# Patient Record
Sex: Female | Born: 1945 | Race: White | Hispanic: No | Smoking: Former smoker
Health system: Southern US, Community
[De-identification: ages and names within clinical notes are randomized; demographics above are authoritative.]

## PROBLEM LIST (undated history)

## (undated) DIAGNOSIS — J329 Chronic sinusitis, unspecified: Secondary | ICD-10-CM

## (undated) DIAGNOSIS — T63441A Toxic effect of venom of bees, accidental (unintentional), initial encounter: Secondary | ICD-10-CM

## (undated) DIAGNOSIS — M545 Low back pain, unspecified: Secondary | ICD-10-CM

## (undated) DIAGNOSIS — M899 Disorder of bone, unspecified: Secondary | ICD-10-CM

## (undated) DIAGNOSIS — M8580 Other specified disorders of bone density and structure, unspecified site: Secondary | ICD-10-CM

## (undated) DIAGNOSIS — F329 Major depressive disorder, single episode, unspecified: Secondary | ICD-10-CM

## (undated) DIAGNOSIS — E559 Vitamin D deficiency, unspecified: Secondary | ICD-10-CM

## (undated) DIAGNOSIS — M1990 Unspecified osteoarthritis, unspecified site: Secondary | ICD-10-CM

## (undated) DIAGNOSIS — N390 Urinary tract infection, site not specified: Secondary | ICD-10-CM

## (undated) DIAGNOSIS — Z780 Asymptomatic menopausal state: Secondary | ICD-10-CM

## (undated) DIAGNOSIS — D692 Other nonthrombocytopenic purpura: Secondary | ICD-10-CM

## (undated) DIAGNOSIS — F411 Generalized anxiety disorder: Secondary | ICD-10-CM

## (undated) DIAGNOSIS — B029 Zoster without complications: Secondary | ICD-10-CM

## (undated) DIAGNOSIS — E7800 Pure hypercholesterolemia, unspecified: Secondary | ICD-10-CM

## (undated) DIAGNOSIS — M810 Age-related osteoporosis without current pathological fracture: Secondary | ICD-10-CM

## (undated) DIAGNOSIS — E785 Hyperlipidemia, unspecified: Secondary | ICD-10-CM

## (undated) DIAGNOSIS — J309 Allergic rhinitis, unspecified: Secondary | ICD-10-CM

## (undated) HISTORY — DX: Pure hypercholesterolemia, unspecified: E78.00

## (undated) HISTORY — DX: Major depressive disorder, single episode, unspecified: F32.9

## (undated) HISTORY — DX: Low back pain: M54.5

## (undated) HISTORY — DX: Toxic effect of venom of bees, accidental (unintentional), initial encounter: T63.441A

## (undated) HISTORY — DX: Vitamin D deficiency, unspecified: E55.9

## (undated) HISTORY — DX: Low back pain, unspecified: M54.50

## (undated) HISTORY — DX: Zoster without complications: B02.9

## (undated) HISTORY — DX: Disorder of bone, unspecified: M89.9

## (undated) HISTORY — PX: NO PAST SURGERIES: SHX2092

## (undated) HISTORY — DX: Hyperlipidemia, unspecified: E78.5

## (undated) HISTORY — DX: Other specified disorders of bone density and structure, unspecified site: M85.80

## (undated) HISTORY — DX: Chronic sinusitis, unspecified: J32.9

## (undated) HISTORY — DX: Urinary tract infection, site not specified: N39.0

## (undated) HISTORY — DX: Generalized anxiety disorder: F41.1

## (undated) HISTORY — DX: Asymptomatic menopausal state: Z78.0

## (undated) HISTORY — DX: Age-related osteoporosis without current pathological fracture: M81.0

## (undated) HISTORY — DX: Unspecified osteoarthritis, unspecified site: M19.90

## (undated) HISTORY — DX: Allergic rhinitis, unspecified: J30.9

## (undated) HISTORY — DX: Other nonthrombocytopenic purpura: D69.2

---

## 1998-02-07 ENCOUNTER — Other Ambulatory Visit: Admission: RE | Admit: 1998-02-07 | Discharge: 1998-02-07 | Payer: Self-pay | Admitting: *Deleted

## 2000-05-19 ENCOUNTER — Other Ambulatory Visit: Admission: RE | Admit: 2000-05-19 | Discharge: 2000-05-19 | Payer: Self-pay | Admitting: *Deleted

## 2010-03-18 LAB — BASIC METABOLIC PANEL
BUN: 12 (ref 4–21)
CO2: 30 — AB (ref 13–22)
Chloride: 104 (ref 99–108)
Creatinine: 0.7 (ref 0.5–1.1)
Glucose: 105
Potassium: 4.4 (ref 3.4–5.3)
Sodium: 138 (ref 137–147)

## 2010-03-18 LAB — LIPID PANEL
Cholesterol: 197 (ref 0–200)
HDL: 72 — AB (ref 35–70)
Triglycerides: 56 (ref 40–160)

## 2010-03-18 LAB — TSH: TSH: 1.03 (ref 0.41–5.90)

## 2010-03-18 LAB — HEPATIC FUNCTION PANEL
ALT: 14 (ref 7–35)
AST: 18 (ref 13–35)
Alkaline Phosphatase: 43 (ref 25–125)
Bilirubin, Total: 0.7

## 2010-03-18 LAB — COMPREHENSIVE METABOLIC PANEL
Albumin: 4.1 (ref 3.5–5.0)
Calcium: 9.1 (ref 8.7–10.7)

## 2010-03-18 LAB — CBC AND DIFFERENTIAL
HCT: 37 (ref 36–46)
Hemoglobin: 12.9 (ref 12.0–16.0)
Platelets: 304 (ref 150–399)
WBC: 6.5

## 2010-03-18 LAB — CBC: RBC: 4 (ref 3.87–5.11)

## 2010-09-16 LAB — LIPID PANEL
Cholesterol: 184 (ref 0–200)
HDL: 123 — AB (ref 35–70)
LDL Cholesterol: 117
Triglycerides: 59 (ref 40–160)

## 2010-09-16 LAB — HEPATIC FUNCTION PANEL
ALT: 16 (ref 7–35)
AST: 21 (ref 13–35)
Alkaline Phosphatase: 42 (ref 25–125)

## 2011-04-16 DIAGNOSIS — L819 Disorder of pigmentation, unspecified: Secondary | ICD-10-CM | POA: Diagnosis not present

## 2011-04-16 DIAGNOSIS — L570 Actinic keratosis: Secondary | ICD-10-CM | POA: Diagnosis not present

## 2011-04-16 DIAGNOSIS — L578 Other skin changes due to chronic exposure to nonionizing radiation: Secondary | ICD-10-CM | POA: Diagnosis not present

## 2011-04-16 DIAGNOSIS — L821 Other seborrheic keratosis: Secondary | ICD-10-CM | POA: Diagnosis not present

## 2011-09-25 DIAGNOSIS — E559 Vitamin D deficiency, unspecified: Secondary | ICD-10-CM | POA: Diagnosis not present

## 2011-09-25 DIAGNOSIS — E785 Hyperlipidemia, unspecified: Secondary | ICD-10-CM | POA: Diagnosis not present

## 2011-09-30 DIAGNOSIS — M949 Disorder of cartilage, unspecified: Secondary | ICD-10-CM | POA: Diagnosis not present

## 2011-09-30 DIAGNOSIS — E559 Vitamin D deficiency, unspecified: Secondary | ICD-10-CM | POA: Diagnosis not present

## 2011-09-30 DIAGNOSIS — E785 Hyperlipidemia, unspecified: Secondary | ICD-10-CM | POA: Diagnosis not present

## 2011-09-30 DIAGNOSIS — F411 Generalized anxiety disorder: Secondary | ICD-10-CM | POA: Diagnosis not present

## 2011-09-30 DIAGNOSIS — M899 Disorder of bone, unspecified: Secondary | ICD-10-CM | POA: Diagnosis not present

## 2011-10-03 DIAGNOSIS — D239 Other benign neoplasm of skin, unspecified: Secondary | ICD-10-CM | POA: Diagnosis not present

## 2011-10-03 DIAGNOSIS — L259 Unspecified contact dermatitis, unspecified cause: Secondary | ICD-10-CM | POA: Diagnosis not present

## 2011-10-03 DIAGNOSIS — L821 Other seborrheic keratosis: Secondary | ICD-10-CM | POA: Diagnosis not present

## 2011-10-03 DIAGNOSIS — Z85828 Personal history of other malignant neoplasm of skin: Secondary | ICD-10-CM | POA: Diagnosis not present

## 2012-01-26 DIAGNOSIS — Z1231 Encounter for screening mammogram for malignant neoplasm of breast: Secondary | ICD-10-CM | POA: Diagnosis not present

## 2012-03-31 DIAGNOSIS — Z85828 Personal history of other malignant neoplasm of skin: Secondary | ICD-10-CM | POA: Diagnosis not present

## 2012-03-31 DIAGNOSIS — C44319 Basal cell carcinoma of skin of other parts of face: Secondary | ICD-10-CM | POA: Diagnosis not present

## 2012-03-31 DIAGNOSIS — L819 Disorder of pigmentation, unspecified: Secondary | ICD-10-CM | POA: Diagnosis not present

## 2012-03-31 DIAGNOSIS — L570 Actinic keratosis: Secondary | ICD-10-CM | POA: Diagnosis not present

## 2012-05-03 DIAGNOSIS — E785 Hyperlipidemia, unspecified: Secondary | ICD-10-CM | POA: Diagnosis not present

## 2012-08-23 DIAGNOSIS — H04129 Dry eye syndrome of unspecified lacrimal gland: Secondary | ICD-10-CM | POA: Diagnosis not present

## 2012-08-23 DIAGNOSIS — H5710 Ocular pain, unspecified eye: Secondary | ICD-10-CM | POA: Diagnosis not present

## 2012-09-10 DIAGNOSIS — H524 Presbyopia: Secondary | ICD-10-CM | POA: Diagnosis not present

## 2012-09-10 DIAGNOSIS — H04129 Dry eye syndrome of unspecified lacrimal gland: Secondary | ICD-10-CM | POA: Diagnosis not present

## 2012-09-10 DIAGNOSIS — H2640 Unspecified secondary cataract: Secondary | ICD-10-CM | POA: Diagnosis not present

## 2012-09-10 DIAGNOSIS — Z961 Presence of intraocular lens: Secondary | ICD-10-CM | POA: Diagnosis not present

## 2012-09-29 DIAGNOSIS — L570 Actinic keratosis: Secondary | ICD-10-CM | POA: Diagnosis not present

## 2012-09-29 DIAGNOSIS — L819 Disorder of pigmentation, unspecified: Secondary | ICD-10-CM | POA: Diagnosis not present

## 2012-09-29 DIAGNOSIS — D239 Other benign neoplasm of skin, unspecified: Secondary | ICD-10-CM | POA: Diagnosis not present

## 2012-09-29 DIAGNOSIS — D485 Neoplasm of uncertain behavior of skin: Secondary | ICD-10-CM | POA: Diagnosis not present

## 2012-09-29 DIAGNOSIS — Z85828 Personal history of other malignant neoplasm of skin: Secondary | ICD-10-CM | POA: Diagnosis not present

## 2012-09-29 DIAGNOSIS — L988 Other specified disorders of the skin and subcutaneous tissue: Secondary | ICD-10-CM | POA: Diagnosis not present

## 2012-09-29 DIAGNOSIS — L821 Other seborrheic keratosis: Secondary | ICD-10-CM | POA: Diagnosis not present

## 2012-09-29 DIAGNOSIS — I789 Disease of capillaries, unspecified: Secondary | ICD-10-CM | POA: Diagnosis not present

## 2012-10-28 DIAGNOSIS — M899 Disorder of bone, unspecified: Secondary | ICD-10-CM | POA: Diagnosis not present

## 2012-10-28 DIAGNOSIS — E7800 Pure hypercholesterolemia, unspecified: Secondary | ICD-10-CM | POA: Diagnosis not present

## 2012-10-28 DIAGNOSIS — Z1211 Encounter for screening for malignant neoplasm of colon: Secondary | ICD-10-CM | POA: Diagnosis not present

## 2012-10-28 DIAGNOSIS — F411 Generalized anxiety disorder: Secondary | ICD-10-CM | POA: Diagnosis not present

## 2012-11-04 DIAGNOSIS — M899 Disorder of bone, unspecified: Secondary | ICD-10-CM | POA: Diagnosis not present

## 2012-11-04 DIAGNOSIS — E7800 Pure hypercholesterolemia, unspecified: Secondary | ICD-10-CM | POA: Diagnosis not present

## 2013-02-02 DIAGNOSIS — Z1231 Encounter for screening mammogram for malignant neoplasm of breast: Secondary | ICD-10-CM | POA: Diagnosis not present

## 2013-04-08 DIAGNOSIS — L570 Actinic keratosis: Secondary | ICD-10-CM | POA: Diagnosis not present

## 2013-04-08 DIAGNOSIS — L819 Disorder of pigmentation, unspecified: Secondary | ICD-10-CM | POA: Diagnosis not present

## 2013-04-08 DIAGNOSIS — Z85828 Personal history of other malignant neoplasm of skin: Secondary | ICD-10-CM | POA: Diagnosis not present

## 2013-04-08 DIAGNOSIS — L723 Sebaceous cyst: Secondary | ICD-10-CM | POA: Diagnosis not present

## 2013-04-28 DIAGNOSIS — E785 Hyperlipidemia, unspecified: Secondary | ICD-10-CM | POA: Diagnosis not present

## 2013-04-28 DIAGNOSIS — F411 Generalized anxiety disorder: Secondary | ICD-10-CM | POA: Diagnosis not present

## 2013-09-15 DIAGNOSIS — H2640 Unspecified secondary cataract: Secondary | ICD-10-CM | POA: Diagnosis not present

## 2013-09-15 DIAGNOSIS — Z961 Presence of intraocular lens: Secondary | ICD-10-CM | POA: Diagnosis not present

## 2013-09-15 DIAGNOSIS — H52209 Unspecified astigmatism, unspecified eye: Secondary | ICD-10-CM | POA: Diagnosis not present

## 2013-10-11 DIAGNOSIS — L570 Actinic keratosis: Secondary | ICD-10-CM | POA: Diagnosis not present

## 2013-10-11 DIAGNOSIS — Z85828 Personal history of other malignant neoplasm of skin: Secondary | ICD-10-CM | POA: Diagnosis not present

## 2013-10-11 DIAGNOSIS — D1801 Hemangioma of skin and subcutaneous tissue: Secondary | ICD-10-CM | POA: Diagnosis not present

## 2013-10-11 DIAGNOSIS — L608 Other nail disorders: Secondary | ICD-10-CM | POA: Diagnosis not present

## 2013-10-11 DIAGNOSIS — I789 Disease of capillaries, unspecified: Secondary | ICD-10-CM | POA: Diagnosis not present

## 2013-10-11 DIAGNOSIS — L821 Other seborrheic keratosis: Secondary | ICD-10-CM | POA: Diagnosis not present

## 2013-10-29 DIAGNOSIS — M5430 Sciatica, unspecified side: Secondary | ICD-10-CM | POA: Diagnosis not present

## 2013-10-29 DIAGNOSIS — M545 Low back pain, unspecified: Secondary | ICD-10-CM | POA: Diagnosis not present

## 2013-10-29 DIAGNOSIS — M25559 Pain in unspecified hip: Secondary | ICD-10-CM | POA: Diagnosis not present

## 2013-11-14 DIAGNOSIS — M1710 Unilateral primary osteoarthritis, unspecified knee: Secondary | ICD-10-CM | POA: Diagnosis not present

## 2013-11-28 DIAGNOSIS — M1712 Unilateral primary osteoarthritis, left knee: Secondary | ICD-10-CM | POA: Diagnosis not present

## 2013-11-28 DIAGNOSIS — M7071 Other bursitis of hip, right hip: Secondary | ICD-10-CM | POA: Diagnosis not present

## 2013-11-30 DIAGNOSIS — M7071 Other bursitis of hip, right hip: Secondary | ICD-10-CM | POA: Diagnosis not present

## 2013-11-30 DIAGNOSIS — M25651 Stiffness of right hip, not elsewhere classified: Secondary | ICD-10-CM | POA: Diagnosis not present

## 2013-11-30 DIAGNOSIS — M6281 Muscle weakness (generalized): Secondary | ICD-10-CM | POA: Diagnosis not present

## 2013-12-01 DIAGNOSIS — M6281 Muscle weakness (generalized): Secondary | ICD-10-CM | POA: Diagnosis not present

## 2013-12-01 DIAGNOSIS — M7071 Other bursitis of hip, right hip: Secondary | ICD-10-CM | POA: Diagnosis not present

## 2013-12-01 DIAGNOSIS — M25651 Stiffness of right hip, not elsewhere classified: Secondary | ICD-10-CM | POA: Diagnosis not present

## 2013-12-06 DIAGNOSIS — M7071 Other bursitis of hip, right hip: Secondary | ICD-10-CM | POA: Diagnosis not present

## 2013-12-06 DIAGNOSIS — M25651 Stiffness of right hip, not elsewhere classified: Secondary | ICD-10-CM | POA: Diagnosis not present

## 2013-12-06 DIAGNOSIS — M6281 Muscle weakness (generalized): Secondary | ICD-10-CM | POA: Diagnosis not present

## 2013-12-07 DIAGNOSIS — Z23 Encounter for immunization: Secondary | ICD-10-CM | POA: Diagnosis not present

## 2013-12-07 DIAGNOSIS — E780 Pure hypercholesterolemia: Secondary | ICD-10-CM | POA: Diagnosis not present

## 2013-12-07 DIAGNOSIS — E559 Vitamin D deficiency, unspecified: Secondary | ICD-10-CM | POA: Diagnosis not present

## 2013-12-07 DIAGNOSIS — F322 Major depressive disorder, single episode, severe without psychotic features: Secondary | ICD-10-CM | POA: Diagnosis not present

## 2013-12-07 DIAGNOSIS — Z79899 Other long term (current) drug therapy: Secondary | ICD-10-CM | POA: Diagnosis not present

## 2013-12-07 DIAGNOSIS — Z0000 Encounter for general adult medical examination without abnormal findings: Secondary | ICD-10-CM | POA: Diagnosis not present

## 2013-12-07 DIAGNOSIS — M899 Disorder of bone, unspecified: Secondary | ICD-10-CM | POA: Diagnosis not present

## 2013-12-13 DIAGNOSIS — M7071 Other bursitis of hip, right hip: Secondary | ICD-10-CM | POA: Diagnosis not present

## 2013-12-13 DIAGNOSIS — M25651 Stiffness of right hip, not elsewhere classified: Secondary | ICD-10-CM | POA: Diagnosis not present

## 2013-12-13 DIAGNOSIS — M6281 Muscle weakness (generalized): Secondary | ICD-10-CM | POA: Diagnosis not present

## 2013-12-16 DIAGNOSIS — M1712 Unilateral primary osteoarthritis, left knee: Secondary | ICD-10-CM | POA: Diagnosis not present

## 2013-12-16 DIAGNOSIS — M25651 Stiffness of right hip, not elsewhere classified: Secondary | ICD-10-CM | POA: Diagnosis not present

## 2013-12-16 DIAGNOSIS — M7071 Other bursitis of hip, right hip: Secondary | ICD-10-CM | POA: Diagnosis not present

## 2013-12-16 DIAGNOSIS — M6281 Muscle weakness (generalized): Secondary | ICD-10-CM | POA: Diagnosis not present

## 2013-12-22 DIAGNOSIS — M7071 Other bursitis of hip, right hip: Secondary | ICD-10-CM | POA: Diagnosis not present

## 2013-12-22 DIAGNOSIS — M6281 Muscle weakness (generalized): Secondary | ICD-10-CM | POA: Diagnosis not present

## 2013-12-22 DIAGNOSIS — M25651 Stiffness of right hip, not elsewhere classified: Secondary | ICD-10-CM | POA: Diagnosis not present

## 2013-12-26 DIAGNOSIS — M1712 Unilateral primary osteoarthritis, left knee: Secondary | ICD-10-CM | POA: Diagnosis not present

## 2013-12-28 DIAGNOSIS — M7071 Other bursitis of hip, right hip: Secondary | ICD-10-CM | POA: Diagnosis not present

## 2013-12-28 DIAGNOSIS — M6281 Muscle weakness (generalized): Secondary | ICD-10-CM | POA: Diagnosis not present

## 2013-12-28 DIAGNOSIS — M25651 Stiffness of right hip, not elsewhere classified: Secondary | ICD-10-CM | POA: Diagnosis not present

## 2014-01-02 DIAGNOSIS — M1712 Unilateral primary osteoarthritis, left knee: Secondary | ICD-10-CM | POA: Diagnosis not present

## 2014-01-09 DIAGNOSIS — M6281 Muscle weakness (generalized): Secondary | ICD-10-CM | POA: Diagnosis not present

## 2014-01-09 DIAGNOSIS — M1712 Unilateral primary osteoarthritis, left knee: Secondary | ICD-10-CM | POA: Diagnosis not present

## 2014-01-09 DIAGNOSIS — M7071 Other bursitis of hip, right hip: Secondary | ICD-10-CM | POA: Diagnosis not present

## 2014-01-09 DIAGNOSIS — M25651 Stiffness of right hip, not elsewhere classified: Secondary | ICD-10-CM | POA: Diagnosis not present

## 2014-01-12 DIAGNOSIS — M7071 Other bursitis of hip, right hip: Secondary | ICD-10-CM | POA: Diagnosis not present

## 2014-01-12 DIAGNOSIS — M6281 Muscle weakness (generalized): Secondary | ICD-10-CM | POA: Diagnosis not present

## 2014-01-12 DIAGNOSIS — M25651 Stiffness of right hip, not elsewhere classified: Secondary | ICD-10-CM | POA: Diagnosis not present

## 2014-01-16 DIAGNOSIS — M1712 Unilateral primary osteoarthritis, left knee: Secondary | ICD-10-CM | POA: Diagnosis not present

## 2014-01-17 DIAGNOSIS — M7071 Other bursitis of hip, right hip: Secondary | ICD-10-CM | POA: Diagnosis not present

## 2014-01-17 DIAGNOSIS — M6281 Muscle weakness (generalized): Secondary | ICD-10-CM | POA: Diagnosis not present

## 2014-01-17 DIAGNOSIS — M25651 Stiffness of right hip, not elsewhere classified: Secondary | ICD-10-CM | POA: Diagnosis not present

## 2014-01-24 DIAGNOSIS — M25651 Stiffness of right hip, not elsewhere classified: Secondary | ICD-10-CM | POA: Diagnosis not present

## 2014-01-24 DIAGNOSIS — M6281 Muscle weakness (generalized): Secondary | ICD-10-CM | POA: Diagnosis not present

## 2014-01-24 DIAGNOSIS — M7071 Other bursitis of hip, right hip: Secondary | ICD-10-CM | POA: Diagnosis not present

## 2014-01-30 DIAGNOSIS — M7071 Other bursitis of hip, right hip: Secondary | ICD-10-CM | POA: Diagnosis not present

## 2014-02-02 DIAGNOSIS — M1712 Unilateral primary osteoarthritis, left knee: Secondary | ICD-10-CM | POA: Diagnosis not present

## 2014-02-02 DIAGNOSIS — M25651 Stiffness of right hip, not elsewhere classified: Secondary | ICD-10-CM | POA: Diagnosis not present

## 2014-02-02 DIAGNOSIS — M7071 Other bursitis of hip, right hip: Secondary | ICD-10-CM | POA: Diagnosis not present

## 2014-02-02 DIAGNOSIS — M6281 Muscle weakness (generalized): Secondary | ICD-10-CM | POA: Diagnosis not present

## 2014-02-04 DIAGNOSIS — S83282A Other tear of lateral meniscus, current injury, left knee, initial encounter: Secondary | ICD-10-CM | POA: Diagnosis not present

## 2014-02-09 DIAGNOSIS — M7071 Other bursitis of hip, right hip: Secondary | ICD-10-CM | POA: Diagnosis not present

## 2014-02-09 DIAGNOSIS — Z1231 Encounter for screening mammogram for malignant neoplasm of breast: Secondary | ICD-10-CM | POA: Diagnosis not present

## 2014-03-15 DIAGNOSIS — X58XXXA Exposure to other specified factors, initial encounter: Secondary | ICD-10-CM | POA: Diagnosis not present

## 2014-03-15 DIAGNOSIS — M94262 Chondromalacia, left knee: Secondary | ICD-10-CM | POA: Diagnosis not present

## 2014-03-15 DIAGNOSIS — Y929 Unspecified place or not applicable: Secondary | ICD-10-CM | POA: Diagnosis not present

## 2014-03-15 DIAGNOSIS — S83272A Complex tear of lateral meniscus, current injury, left knee, initial encounter: Secondary | ICD-10-CM | POA: Diagnosis not present

## 2014-03-15 DIAGNOSIS — S83282A Other tear of lateral meniscus, current injury, left knee, initial encounter: Secondary | ICD-10-CM | POA: Diagnosis not present

## 2014-03-15 DIAGNOSIS — M6752 Plica syndrome, left knee: Secondary | ICD-10-CM | POA: Diagnosis not present

## 2014-03-23 DIAGNOSIS — M6281 Muscle weakness (generalized): Secondary | ICD-10-CM | POA: Diagnosis not present

## 2014-03-23 DIAGNOSIS — M25562 Pain in left knee: Secondary | ICD-10-CM | POA: Diagnosis not present

## 2014-03-23 DIAGNOSIS — R262 Difficulty in walking, not elsewhere classified: Secondary | ICD-10-CM | POA: Diagnosis not present

## 2014-03-23 DIAGNOSIS — Z09 Encounter for follow-up examination after completed treatment for conditions other than malignant neoplasm: Secondary | ICD-10-CM | POA: Diagnosis not present

## 2014-03-28 DIAGNOSIS — M25562 Pain in left knee: Secondary | ICD-10-CM | POA: Diagnosis not present

## 2014-03-28 DIAGNOSIS — R262 Difficulty in walking, not elsewhere classified: Secondary | ICD-10-CM | POA: Diagnosis not present

## 2014-03-28 DIAGNOSIS — M6281 Muscle weakness (generalized): Secondary | ICD-10-CM | POA: Diagnosis not present

## 2014-03-30 DIAGNOSIS — R262 Difficulty in walking, not elsewhere classified: Secondary | ICD-10-CM | POA: Diagnosis not present

## 2014-03-30 DIAGNOSIS — M25562 Pain in left knee: Secondary | ICD-10-CM | POA: Diagnosis not present

## 2014-03-30 DIAGNOSIS — M6281 Muscle weakness (generalized): Secondary | ICD-10-CM | POA: Diagnosis not present

## 2014-04-04 DIAGNOSIS — M6281 Muscle weakness (generalized): Secondary | ICD-10-CM | POA: Diagnosis not present

## 2014-04-04 DIAGNOSIS — M25562 Pain in left knee: Secondary | ICD-10-CM | POA: Diagnosis not present

## 2014-04-04 DIAGNOSIS — R262 Difficulty in walking, not elsewhere classified: Secondary | ICD-10-CM | POA: Diagnosis not present

## 2014-04-06 DIAGNOSIS — Z9889 Other specified postprocedural states: Secondary | ICD-10-CM | POA: Diagnosis not present

## 2014-04-11 DIAGNOSIS — L821 Other seborrheic keratosis: Secondary | ICD-10-CM | POA: Diagnosis not present

## 2014-04-11 DIAGNOSIS — L720 Epidermal cyst: Secondary | ICD-10-CM | POA: Diagnosis not present

## 2014-04-11 DIAGNOSIS — Z85828 Personal history of other malignant neoplasm of skin: Secondary | ICD-10-CM | POA: Diagnosis not present

## 2014-04-11 DIAGNOSIS — L570 Actinic keratosis: Secondary | ICD-10-CM | POA: Diagnosis not present

## 2014-04-11 DIAGNOSIS — D485 Neoplasm of uncertain behavior of skin: Secondary | ICD-10-CM | POA: Diagnosis not present

## 2014-05-04 DIAGNOSIS — M1712 Unilateral primary osteoarthritis, left knee: Secondary | ICD-10-CM | POA: Diagnosis not present

## 2014-05-11 DIAGNOSIS — M1712 Unilateral primary osteoarthritis, left knee: Secondary | ICD-10-CM | POA: Diagnosis not present

## 2014-05-18 DIAGNOSIS — M1712 Unilateral primary osteoarthritis, left knee: Secondary | ICD-10-CM | POA: Diagnosis not present

## 2014-05-30 DIAGNOSIS — M1712 Unilateral primary osteoarthritis, left knee: Secondary | ICD-10-CM | POA: Diagnosis not present

## 2014-06-06 DIAGNOSIS — M1712 Unilateral primary osteoarthritis, left knee: Secondary | ICD-10-CM | POA: Diagnosis not present

## 2014-11-16 DIAGNOSIS — H26491 Other secondary cataract, right eye: Secondary | ICD-10-CM | POA: Diagnosis not present

## 2014-11-16 DIAGNOSIS — Z961 Presence of intraocular lens: Secondary | ICD-10-CM | POA: Diagnosis not present

## 2014-11-17 DIAGNOSIS — L570 Actinic keratosis: Secondary | ICD-10-CM | POA: Diagnosis not present

## 2014-11-17 DIAGNOSIS — D1801 Hemangioma of skin and subcutaneous tissue: Secondary | ICD-10-CM | POA: Diagnosis not present

## 2014-11-17 DIAGNOSIS — Z85828 Personal history of other malignant neoplasm of skin: Secondary | ICD-10-CM | POA: Diagnosis not present

## 2014-11-17 DIAGNOSIS — L821 Other seborrheic keratosis: Secondary | ICD-10-CM | POA: Diagnosis not present

## 2014-11-17 DIAGNOSIS — L720 Epidermal cyst: Secondary | ICD-10-CM | POA: Diagnosis not present

## 2014-12-04 DIAGNOSIS — M1712 Unilateral primary osteoarthritis, left knee: Secondary | ICD-10-CM | POA: Diagnosis not present

## 2014-12-12 DIAGNOSIS — M1712 Unilateral primary osteoarthritis, left knee: Secondary | ICD-10-CM | POA: Diagnosis not present

## 2014-12-19 DIAGNOSIS — M1712 Unilateral primary osteoarthritis, left knee: Secondary | ICD-10-CM | POA: Diagnosis not present

## 2014-12-26 DIAGNOSIS — M1712 Unilateral primary osteoarthritis, left knee: Secondary | ICD-10-CM | POA: Diagnosis not present

## 2015-01-01 DIAGNOSIS — D692 Other nonthrombocytopenic purpura: Secondary | ICD-10-CM | POA: Diagnosis not present

## 2015-01-01 DIAGNOSIS — E785 Hyperlipidemia, unspecified: Secondary | ICD-10-CM | POA: Diagnosis not present

## 2015-01-01 DIAGNOSIS — Z0000 Encounter for general adult medical examination without abnormal findings: Secondary | ICD-10-CM | POA: Diagnosis not present

## 2015-01-01 DIAGNOSIS — M899 Disorder of bone, unspecified: Secondary | ICD-10-CM | POA: Diagnosis not present

## 2015-01-01 DIAGNOSIS — F322 Major depressive disorder, single episode, severe without psychotic features: Secondary | ICD-10-CM | POA: Diagnosis not present

## 2015-01-01 DIAGNOSIS — E559 Vitamin D deficiency, unspecified: Secondary | ICD-10-CM | POA: Diagnosis not present

## 2015-01-01 DIAGNOSIS — Z79899 Other long term (current) drug therapy: Secondary | ICD-10-CM | POA: Diagnosis not present

## 2015-01-02 DIAGNOSIS — M1712 Unilateral primary osteoarthritis, left knee: Secondary | ICD-10-CM | POA: Diagnosis not present

## 2015-01-31 DIAGNOSIS — Z1211 Encounter for screening for malignant neoplasm of colon: Secondary | ICD-10-CM | POA: Diagnosis not present

## 2015-02-15 DIAGNOSIS — Z1231 Encounter for screening mammogram for malignant neoplasm of breast: Secondary | ICD-10-CM | POA: Diagnosis not present

## 2015-06-19 DIAGNOSIS — Z85828 Personal history of other malignant neoplasm of skin: Secondary | ICD-10-CM | POA: Diagnosis not present

## 2015-06-19 DIAGNOSIS — L814 Other melanin hyperpigmentation: Secondary | ICD-10-CM | POA: Diagnosis not present

## 2015-06-19 DIAGNOSIS — L570 Actinic keratosis: Secondary | ICD-10-CM | POA: Diagnosis not present

## 2015-06-19 DIAGNOSIS — D485 Neoplasm of uncertain behavior of skin: Secondary | ICD-10-CM | POA: Diagnosis not present

## 2015-07-05 DIAGNOSIS — M1712 Unilateral primary osteoarthritis, left knee: Secondary | ICD-10-CM | POA: Diagnosis not present

## 2015-07-17 DIAGNOSIS — M1712 Unilateral primary osteoarthritis, left knee: Secondary | ICD-10-CM | POA: Diagnosis not present

## 2015-07-24 DIAGNOSIS — M1712 Unilateral primary osteoarthritis, left knee: Secondary | ICD-10-CM | POA: Diagnosis not present

## 2015-07-31 DIAGNOSIS — M1712 Unilateral primary osteoarthritis, left knee: Secondary | ICD-10-CM | POA: Diagnosis not present

## 2015-08-07 DIAGNOSIS — M1712 Unilateral primary osteoarthritis, left knee: Secondary | ICD-10-CM | POA: Diagnosis not present

## 2015-11-21 DIAGNOSIS — H26491 Other secondary cataract, right eye: Secondary | ICD-10-CM | POA: Diagnosis not present

## 2015-11-21 DIAGNOSIS — H524 Presbyopia: Secondary | ICD-10-CM | POA: Diagnosis not present

## 2015-11-21 DIAGNOSIS — Z961 Presence of intraocular lens: Secondary | ICD-10-CM | POA: Diagnosis not present

## 2015-12-19 DIAGNOSIS — I788 Other diseases of capillaries: Secondary | ICD-10-CM | POA: Diagnosis not present

## 2015-12-19 DIAGNOSIS — D1801 Hemangioma of skin and subcutaneous tissue: Secondary | ICD-10-CM | POA: Diagnosis not present

## 2015-12-19 DIAGNOSIS — L821 Other seborrheic keratosis: Secondary | ICD-10-CM | POA: Diagnosis not present

## 2015-12-19 DIAGNOSIS — Z85828 Personal history of other malignant neoplasm of skin: Secondary | ICD-10-CM | POA: Diagnosis not present

## 2016-01-15 DIAGNOSIS — M8580 Other specified disorders of bone density and structure, unspecified site: Secondary | ICD-10-CM | POA: Diagnosis not present

## 2016-01-15 DIAGNOSIS — F322 Major depressive disorder, single episode, severe without psychotic features: Secondary | ICD-10-CM | POA: Diagnosis not present

## 2016-01-15 DIAGNOSIS — E559 Vitamin D deficiency, unspecified: Secondary | ICD-10-CM | POA: Diagnosis not present

## 2016-01-15 DIAGNOSIS — Z1159 Encounter for screening for other viral diseases: Secondary | ICD-10-CM | POA: Diagnosis not present

## 2016-01-15 DIAGNOSIS — E785 Hyperlipidemia, unspecified: Secondary | ICD-10-CM | POA: Diagnosis not present

## 2016-01-15 DIAGNOSIS — Z0000 Encounter for general adult medical examination without abnormal findings: Secondary | ICD-10-CM | POA: Diagnosis not present

## 2016-01-21 DIAGNOSIS — Z1211 Encounter for screening for malignant neoplasm of colon: Secondary | ICD-10-CM | POA: Diagnosis not present

## 2016-02-20 DIAGNOSIS — M8589 Other specified disorders of bone density and structure, multiple sites: Secondary | ICD-10-CM | POA: Diagnosis not present

## 2016-02-20 DIAGNOSIS — Z1231 Encounter for screening mammogram for malignant neoplasm of breast: Secondary | ICD-10-CM | POA: Diagnosis not present

## 2016-03-20 DIAGNOSIS — M1712 Unilateral primary osteoarthritis, left knee: Secondary | ICD-10-CM | POA: Diagnosis not present

## 2016-03-27 DIAGNOSIS — M1712 Unilateral primary osteoarthritis, left knee: Secondary | ICD-10-CM | POA: Diagnosis not present

## 2016-04-03 DIAGNOSIS — M25562 Pain in left knee: Secondary | ICD-10-CM | POA: Diagnosis not present

## 2016-04-03 DIAGNOSIS — M1712 Unilateral primary osteoarthritis, left knee: Secondary | ICD-10-CM | POA: Diagnosis not present

## 2016-04-10 DIAGNOSIS — M1712 Unilateral primary osteoarthritis, left knee: Secondary | ICD-10-CM | POA: Diagnosis not present

## 2016-04-17 DIAGNOSIS — M1712 Unilateral primary osteoarthritis, left knee: Secondary | ICD-10-CM | POA: Diagnosis not present

## 2016-07-15 DIAGNOSIS — J329 Chronic sinusitis, unspecified: Secondary | ICD-10-CM | POA: Diagnosis not present

## 2016-07-15 DIAGNOSIS — F322 Major depressive disorder, single episode, severe without psychotic features: Secondary | ICD-10-CM | POA: Diagnosis not present

## 2016-07-23 DIAGNOSIS — L821 Other seborrheic keratosis: Secondary | ICD-10-CM | POA: Diagnosis not present

## 2016-07-23 DIAGNOSIS — D1801 Hemangioma of skin and subcutaneous tissue: Secondary | ICD-10-CM | POA: Diagnosis not present

## 2016-07-23 DIAGNOSIS — L720 Epidermal cyst: Secondary | ICD-10-CM | POA: Diagnosis not present

## 2016-07-23 DIAGNOSIS — Z85828 Personal history of other malignant neoplasm of skin: Secondary | ICD-10-CM | POA: Diagnosis not present

## 2016-10-28 DIAGNOSIS — M1712 Unilateral primary osteoarthritis, left knee: Secondary | ICD-10-CM | POA: Diagnosis not present

## 2016-10-28 DIAGNOSIS — M25562 Pain in left knee: Secondary | ICD-10-CM | POA: Diagnosis not present

## 2016-11-04 DIAGNOSIS — M1712 Unilateral primary osteoarthritis, left knee: Secondary | ICD-10-CM | POA: Diagnosis not present

## 2016-11-13 DIAGNOSIS — M1712 Unilateral primary osteoarthritis, left knee: Secondary | ICD-10-CM | POA: Diagnosis not present

## 2016-11-21 DIAGNOSIS — H26491 Other secondary cataract, right eye: Secondary | ICD-10-CM | POA: Diagnosis not present

## 2016-11-21 DIAGNOSIS — H02831 Dermatochalasis of right upper eyelid: Secondary | ICD-10-CM | POA: Diagnosis not present

## 2016-11-21 DIAGNOSIS — Z961 Presence of intraocular lens: Secondary | ICD-10-CM | POA: Diagnosis not present

## 2016-11-21 DIAGNOSIS — H524 Presbyopia: Secondary | ICD-10-CM | POA: Diagnosis not present

## 2017-02-13 DIAGNOSIS — F322 Major depressive disorder, single episode, severe without psychotic features: Secondary | ICD-10-CM | POA: Diagnosis not present

## 2017-02-13 DIAGNOSIS — Z79899 Other long term (current) drug therapy: Secondary | ICD-10-CM | POA: Diagnosis not present

## 2017-02-13 DIAGNOSIS — D692 Other nonthrombocytopenic purpura: Secondary | ICD-10-CM | POA: Diagnosis not present

## 2017-02-13 DIAGNOSIS — M545 Low back pain: Secondary | ICD-10-CM | POA: Diagnosis not present

## 2017-02-13 DIAGNOSIS — E559 Vitamin D deficiency, unspecified: Secondary | ICD-10-CM | POA: Diagnosis not present

## 2017-02-13 DIAGNOSIS — Z0000 Encounter for general adult medical examination without abnormal findings: Secondary | ICD-10-CM | POA: Diagnosis not present

## 2017-02-13 DIAGNOSIS — J309 Allergic rhinitis, unspecified: Secondary | ICD-10-CM | POA: Diagnosis not present

## 2017-02-13 DIAGNOSIS — F17211 Nicotine dependence, cigarettes, in remission: Secondary | ICD-10-CM | POA: Diagnosis not present

## 2017-02-13 DIAGNOSIS — E785 Hyperlipidemia, unspecified: Secondary | ICD-10-CM | POA: Diagnosis not present

## 2017-02-13 DIAGNOSIS — Z1211 Encounter for screening for malignant neoplasm of colon: Secondary | ICD-10-CM | POA: Diagnosis not present

## 2017-02-19 DIAGNOSIS — Z1231 Encounter for screening mammogram for malignant neoplasm of breast: Secondary | ICD-10-CM | POA: Diagnosis not present

## 2017-02-26 ENCOUNTER — Other Ambulatory Visit: Payer: Self-pay | Admitting: Family Medicine

## 2017-02-26 DIAGNOSIS — F17211 Nicotine dependence, cigarettes, in remission: Secondary | ICD-10-CM

## 2017-03-03 ENCOUNTER — Ambulatory Visit: Payer: Self-pay

## 2017-03-10 ENCOUNTER — Ambulatory Visit
Admission: RE | Admit: 2017-03-10 | Discharge: 2017-03-10 | Disposition: A | Discharge status: Home / Self Care | Payer: Medicare Other | Source: Ambulatory Visit | Attending: Family Medicine | Admitting: Family Medicine

## 2017-03-10 DIAGNOSIS — Z87891 Personal history of nicotine dependence: Secondary | ICD-10-CM | POA: Diagnosis not present

## 2017-03-10 DIAGNOSIS — F17211 Nicotine dependence, cigarettes, in remission: Secondary | ICD-10-CM

## 2017-03-13 ENCOUNTER — Telehealth: Payer: Self-pay

## 2017-03-13 NOTE — Telephone Encounter (Signed)
SENT REFERRAL TO SCHEDULING 

## 2017-03-25 ENCOUNTER — Other Ambulatory Visit: Payer: Self-pay | Admitting: *Deleted

## 2017-03-25 ENCOUNTER — Encounter: Payer: Self-pay | Admitting: Interventional Cardiology

## 2017-03-25 ENCOUNTER — Encounter: Payer: Self-pay | Admitting: *Deleted

## 2017-03-25 DIAGNOSIS — M545 Low back pain, unspecified: Secondary | ICD-10-CM | POA: Insufficient documentation

## 2017-03-25 DIAGNOSIS — B029 Zoster without complications: Secondary | ICD-10-CM | POA: Insufficient documentation

## 2017-03-25 DIAGNOSIS — F411 Generalized anxiety disorder: Secondary | ICD-10-CM | POA: Insufficient documentation

## 2017-03-25 DIAGNOSIS — L570 Actinic keratosis: Secondary | ICD-10-CM | POA: Diagnosis not present

## 2017-03-25 DIAGNOSIS — E569 Vitamin deficiency, unspecified: Secondary | ICD-10-CM | POA: Insufficient documentation

## 2017-03-25 DIAGNOSIS — L720 Epidermal cyst: Secondary | ICD-10-CM | POA: Diagnosis not present

## 2017-03-25 DIAGNOSIS — F329 Major depressive disorder, single episode, unspecified: Secondary | ICD-10-CM | POA: Insufficient documentation

## 2017-03-25 DIAGNOSIS — J329 Chronic sinusitis, unspecified: Secondary | ICD-10-CM | POA: Insufficient documentation

## 2017-03-25 DIAGNOSIS — D692 Other nonthrombocytopenic purpura: Secondary | ICD-10-CM | POA: Insufficient documentation

## 2017-03-25 DIAGNOSIS — M8580 Other specified disorders of bone density and structure, unspecified site: Secondary | ICD-10-CM | POA: Insufficient documentation

## 2017-03-25 DIAGNOSIS — E782 Mixed hyperlipidemia: Secondary | ICD-10-CM | POA: Insufficient documentation

## 2017-03-25 DIAGNOSIS — J309 Allergic rhinitis, unspecified: Secondary | ICD-10-CM | POA: Insufficient documentation

## 2017-03-25 DIAGNOSIS — E7800 Pure hypercholesterolemia, unspecified: Secondary | ICD-10-CM | POA: Insufficient documentation

## 2017-03-25 DIAGNOSIS — N390 Urinary tract infection, site not specified: Secondary | ICD-10-CM | POA: Insufficient documentation

## 2017-03-25 DIAGNOSIS — Z85828 Personal history of other malignant neoplasm of skin: Secondary | ICD-10-CM | POA: Diagnosis not present

## 2017-03-26 ENCOUNTER — Encounter (INDEPENDENT_AMBULATORY_CARE_PROVIDER_SITE_OTHER): Payer: Self-pay

## 2017-03-26 ENCOUNTER — Ambulatory Visit (INDEPENDENT_AMBULATORY_CARE_PROVIDER_SITE_OTHER): Payer: Medicare Other | Admitting: Internal Medicine

## 2017-03-26 ENCOUNTER — Encounter: Payer: Self-pay | Admitting: Internal Medicine

## 2017-03-26 VITALS — BP 154/88 | HR 77 | Ht 66.0 in | Wt 142.8 lb

## 2017-03-26 DIAGNOSIS — I2510 Atherosclerotic heart disease of native coronary artery without angina pectoris: Secondary | ICD-10-CM | POA: Diagnosis not present

## 2017-03-26 DIAGNOSIS — I491 Atrial premature depolarization: Secondary | ICD-10-CM | POA: Diagnosis not present

## 2017-03-26 DIAGNOSIS — I10 Essential (primary) hypertension: Secondary | ICD-10-CM | POA: Diagnosis not present

## 2017-03-26 DIAGNOSIS — E7800 Pure hypercholesterolemia, unspecified: Secondary | ICD-10-CM

## 2017-03-26 MED ORDER — AMOXICILLIN 500 MG PO CAPS: 500.0000 mg | ORAL_CAPSULE | Freq: Two times a day (BID) | ORAL | 0 refills | Status: AC

## 2017-03-26 MED ORDER — ROSUVASTATIN CALCIUM 20 MG PO TABS: 20.0000 mg | ORAL_TABLET | Freq: Every day | ORAL | 3 refills | Status: DC

## 2017-03-26 NOTE — Progress Notes (Signed)
Cardiology Office Note   Date:  03/26/2017   ID:  Carly Myers, DOB 1945/07/21, MRN [id number]  PCP:  Jonathon Jordan, MD  Cardiologist:   Dorris Carnes, MD   Pt referred by Dr Stephanie Acre for abnormal chest Xray     History of Present Illness: Carly Myers is a 72 y.o. female SHe has a history of tobacco use  Friend has lung CA  She had a CT of chest Chest CT in Jan Findings included Aortic atherosclerosis noted, calcification of LAD noted    Labs in December 2018 LDL 148, HDL 67    Pt denies CP  Breathing is OK  Used to be very active Hurt knee Goes to silver sneakers  Mows lawn  Shovels  Walks daily    Occasionally "panics" at night while in bed  Not at other times  No dizziness   Current Meds  Medication Sig  . aspirin EC 81 MG tablet Take 81 mg by mouth daily.  . Azelastine HCl 0.15 % SOLN Place 1 spray into both nostrils 2 (two) times daily.  . Biotin w/ Vitamins C & E (HAIR/SKIN/NAILS PO) Take by mouth daily.  . Calcium Carb-Cholecalciferol (CALCIUM + D3) 600-200 MG-UNIT TABS Take 1 tablet by mouth daily.  . Cholecalciferol (VITAMIN D) 2000 units CAPS Take 2,000 Units by mouth daily.  Marland Kitchen EPINEPHrine 0.3 mg/0.3 mL IJ SOAJ injection Inject 0.3 mg into the muscle once.  Javier Docker Oil 1000 MG CAPS Take 1 capsule by mouth daily.  . Red Yeast Rice 600 MG CAPS Take 1 capsule by mouth daily.     Allergies:   Patient has no known allergies.   Past Medical History:  Diagnosis Date  . Allergic rhinitis   . Anaphylactic reaction to bee sting   . Anxiety state    unspecified  . Arthritis    Ortho Dr. Berenice Primas  . Disorder of bone    unspecified  . Hypercholesteremia    migrated  . Hyperlipemia   . Low back pain   . Major depression    in complete remission  . Osteopenia   . Osteoporosis   . Post-menopausal   . Senile purpura ([place])   . Shingles   . Sinusitis   . UTI (urinary tract infection)   . Vitamin D deficiency     History reviewed. No pertinent surgical  history.   Social History:  The patient  reports that  has never smoked. she has never used smokeless tobacco.   Family History:  The patient's family history is not on file.    ROS:  Please see the history of present illness. All other systems are reviewed and  Negative to the above problem except as noted.    PHYSICAL EXAM: VS:  BP (!) 154/88   Pulse 77   Ht 5\' 6"  (1.676 m)   Wt 142 lb 12.8 oz (64.8 kg)   BMI 23.05 kg/m   GEN: Well nourished, well developed, in no acute distress  HEENT: normal  Neck: no JVD, carotid bruits, or masses Cardiac: RRR with frequent regular skips ; no murmurs, rubs, or gallops,no edema  Respiratory:  clear to auscultation bilaterally, normal work of breathing GI: soft, nontender, nondistended, + BS  No hepatomegaly  MS: no deformity Moving all extremities   Skin: warm and dry, no rash Neuro:  Strength and sensation are intact Psych: euthymic mood, full affect   EKG:  EKG is ordered today.  SR 77 bpm  Septal MI  PACs (bigeminy)   Lipid Panel No results found for: CHOL, TRIG, HDL, CHOLHDL, VLDL, LDLCALC, LDLDIRECT    Wt Readings from Last 3 Encounters:  03/26/17 142 lb 12.8 oz (64.8 kg)      ASSESSMENT AND PLAN:  1  CAD  Pt with evid of atherosclerosis on CT   Denies symptoms to sugg angina   Active   Needs tighter control of risk factors  2  HL   Would stop red yeast rice  Starte Crestor 20 mg per day   F/U lipdis in 8 wks    3  PACs  Will set up for 24 hour holter    4  HTN  BP is up  She is anxious   I told her I would follow up in 1 month  Bring cuff  5  URI  Pt has been taking old amoxicillin for 7 days  500 bid   Still with sinus pressure   I have gone ahead and given Rx for 5 days   No refills       Current medicines are reviewed at length with the patient today.  The patient does not have concerns regarding medicines.  Signed, Dorris Carnes, MD  03/26/2017 3:02 PM    [facility] [place],  [place], [place]  [address] Phone: [phone]; Fax: [phone]

## 2017-03-26 NOTE — Patient Instructions (Addendum)
Your physician has recommended you make the following change in your medication:  1.) stop Red Yeast Rice 2.) start rosuvastatin (Crestor) 20 mg by mouth once daily 3.) amoxil 500 mg two times a day for 5 days  Your physician has recommended that you wear a holter monitor. Holter monitors are medical devices that record the heart's electrical activity. Doctors most often use these monitors to diagnose arrhythmias. Arrhythmias are problems with the speed or rhythm of the heartbeat. The monitor is a small, portable device. You can wear one while you do your normal daily activities. This is usually used to diagnose what is causing palpitations/syncope (passing out).  Your physician recommends that you return for lab work in: 2 months for FASTING LIPIDS/AST  Your physician recommends that you schedule a follow-up appointment in: [place].  (after the monitor is completed)  Bring blood pressure cuff and list of blood pressure readings to this appointment.

## 2017-04-02 ENCOUNTER — Ambulatory Visit (INDEPENDENT_AMBULATORY_CARE_PROVIDER_SITE_OTHER): Payer: Medicare Other

## 2017-04-02 DIAGNOSIS — I491 Atrial premature depolarization: Secondary | ICD-10-CM | POA: Diagnosis not present

## 2017-04-08 ENCOUNTER — Telehealth: Payer: Self-pay | Admitting: *Deleted

## 2017-04-08 DIAGNOSIS — I480 Paroxysmal atrial fibrillation: Secondary | ICD-10-CM

## 2017-04-08 MED ORDER — METOPROLOL SUCCINATE ER 25 MG PO TB24: 25.0000 mg | ORAL_TABLET | Freq: Two times a day (BID) | ORAL | 11 refills | Status: DC

## 2017-04-08 NOTE — Telephone Encounter (Signed)
Spoke with patient, reviewed results. Coming for labs tomorrow Sent Toprol XL to her pharmacy Echo is scheduled for 04/15/17 and she already has follow up with Dr. Harrington Challenger scheduled.  Advised to call with any questions/concerns.  She is nervous getting this information.

## 2017-04-08 NOTE — Telephone Encounter (Signed)
-----   Message from [name], MD sent at 04/07/2017 11:52 PM EST ----- Holter monitor shows SR but also atrial ectopy with atrial flutter, atrial fib I would reocmm that she start a NOAC  Needs to get CBC and BMET first  WOuld set up for echo  Dx PAF I would add Toprol XL 25 bid    Make sure has f/u in clnic in a few wks

## 2017-04-09 ENCOUNTER — Other Ambulatory Visit: Payer: Medicare Other | Admitting: *Deleted

## 2017-04-09 DIAGNOSIS — I480 Paroxysmal atrial fibrillation: Secondary | ICD-10-CM

## 2017-04-10 LAB — BASIC METABOLIC PANEL
BUN / CREAT RATIO: 21 (ref 12–28)
BUN: 15 mg/dL (ref 8–27)
CO2: 24 mmol/L (ref 20–29)
CREATININE: 0.71 mg/dL (ref 0.57–1.00)
Calcium: 9.4 mg/dL (ref 8.7–10.3)
Chloride: 104 mmol/L (ref 96–106)
GFR calc Af Amer: 98 mL/min/{1.73_m2} (ref >59)
GFR, EST NON AFRICAN AMERICAN: 85 mL/min/{1.73_m2} (ref >59)
GLUCOSE: 94 mg/dL (ref 65–99)
Potassium: 5.1 mmol/L (ref 3.5–5.2)
SODIUM: 141 mmol/L (ref 134–144)

## 2017-04-10 LAB — CBC
HEMOGLOBIN: 12.6 g/dL (ref 11.1–15.9)
Hematocrit: 38.8 % (ref 34.0–46.6)
MCH: 31.2 pg (ref 26.6–33.0)
MCHC: 32.5 g/dL (ref 31.5–35.7)
MCV: 96 fL (ref 79–97)
Platelets: 346 10*3/uL (ref 150–379)
RBC: 4.04 x10E6/uL (ref 3.77–5.28)
RDW: 13.8 % (ref 12.3–15.4)
WBC: 7.8 10*3/uL (ref 3.4–10.8)

## 2017-04-13 ENCOUNTER — Telehealth: Payer: Self-pay | Admitting: Internal Medicine

## 2017-04-13 NOTE — Telephone Encounter (Signed)
Patient aware of lab results. Please see lab notes.

## 2017-04-13 NOTE — Telephone Encounter (Signed)
New Message ° ° °Patient is returning call in reference to labs. Please call to discuss.  °

## 2017-04-15 ENCOUNTER — Ambulatory Visit (HOSPITAL_COMMUNITY): Payer: Medicare Other | Attending: Family Medicine

## 2017-04-15 ENCOUNTER — Other Ambulatory Visit: Payer: Self-pay

## 2017-04-15 DIAGNOSIS — I480 Paroxysmal atrial fibrillation: Secondary | ICD-10-CM | POA: Diagnosis not present

## 2017-04-24 ENCOUNTER — Encounter: Payer: Self-pay | Admitting: Internal Medicine

## 2017-04-24 ENCOUNTER — Ambulatory Visit (INDEPENDENT_AMBULATORY_CARE_PROVIDER_SITE_OTHER): Payer: Medicare Other | Admitting: Internal Medicine

## 2017-04-24 VITALS — BP 140/60 | HR 48 | Ht 66.0 in | Wt 142.8 lb

## 2017-04-24 DIAGNOSIS — E7800 Pure hypercholesterolemia, unspecified: Secondary | ICD-10-CM | POA: Diagnosis not present

## 2017-04-24 DIAGNOSIS — I491 Atrial premature depolarization: Secondary | ICD-10-CM | POA: Diagnosis not present

## 2017-04-24 DIAGNOSIS — I2510 Atherosclerotic heart disease of native coronary artery without angina pectoris: Secondary | ICD-10-CM | POA: Diagnosis not present

## 2017-04-24 DIAGNOSIS — I480 Paroxysmal atrial fibrillation: Secondary | ICD-10-CM

## 2017-04-24 LAB — TSH: TSH: 0.534 u[IU]/mL (ref 0.450–4.500)

## 2017-04-24 NOTE — Progress Notes (Signed)
Cardiology Office Note   Date:  04/24/2017   ID:  Carly Myers, DOB 15-Jun-1945, MRN [id number]  PCP:  Jonathon Jordan, MD  Cardiologist:   Dorris Carnes, MD   F/U of atrial fibrillatoin     History of Present Illness: Carly Myers is a 72 y.o. female I saw hier in Jan for abnormal chest CT Chest CT showed Aortic atherosclerosis noted, calcification of LAD noted    Labs in December 2018 LDL 148, HDL 67    When I saw her in January I recomm starting a statin  Set up for holter monitor  This showed atrial fib/atrial flutter  Recomm Toprol XL bid and Xarelto.  Pt set up for echo    Echo wasdone and was normal  Since seen no dizziness  Breathing is OK  No CP  No signif fluttering  Current Meds  Medication Sig  . aspirin EC 81 MG tablet Take 81 mg by mouth daily.  . Biotin w/ Vitamins C & E (HAIR/SKIN/NAILS PO) Take by mouth daily.  . Calcium Carb-Cholecalciferol (CALCIUM + D3) 600-200 MG-UNIT TABS Take 1 tablet by mouth daily.  . Cholecalciferol (VITAMIN D) 2000 units CAPS Take 2,000 Units by mouth daily.  Marland Kitchen EPINEPHrine 0.3 mg/0.3 mL IJ SOAJ injection Inject 0.3 mg into the muscle once.  Javier Docker Oil 1000 MG CAPS Take 1 capsule by mouth daily.  . metoprolol succinate (TOPROL-XL) 25 MG 24 hr tablet Take 1 tablet (25 mg total) by mouth 2 (two) times daily.  . rivaroxaban (XARELTO) 20 MG TABS tablet Take 20 mg by mouth daily with supper.  . rosuvastatin (CRESTOR) 20 MG tablet Take 1 tablet (20 mg total) by mouth daily.     Allergies:   Patient has no known allergies.   Past Medical History:  Diagnosis Date  . Allergic rhinitis   . Anaphylactic reaction to bee sting   . Anxiety state    unspecified  . Arthritis    Ortho Dr. Berenice Primas  . Disorder of bone    unspecified  . Hypercholesteremia    migrated  . Hyperlipemia   . Low back pain   . Major depression    in complete remission  . Osteopenia   . Osteoporosis   . Post-menopausal   . Senile purpura ([place])   . Shingles   .  Sinusitis   . UTI (urinary tract infection)   . Vitamin D deficiency     History reviewed. No pertinent surgical history.   Social History:  The patient  reports that  has never smoked. she has never used smokeless tobacco.   Family History:  The patient's family history is not on file.    ROS:  Please see the history of present illness. All other systems are reviewed and  Negative to the above problem except as noted.    PHYSICAL EXAM: VS:  BP 140/60   Pulse (!) 48   Ht 5\' 6"  (1.676 m)   Wt 142 lb 12.8 oz (64.8 kg)   SpO2 98%   BMI 23.05 kg/m   GEN: Well nourished, well developed, in no acute distress  HEENT: normal  Neck: JVP normal  No carotid bruits, or masses Cardiac: RRR with frequent skips ; no murmurs, rubs, or gallops,no edema  Respiratory:  clear to auscultation bilaterally, normal work of breathing GI: soft, nontender, nondistended, + BS  No hepatomegaly  MS: no deformity Moving all extremities   Skin: warm and dry, no rash Neuro:  Strength  and sensation are intact Psych: euthymic mood, full affect   EKG:  EKG is not ordered today.   Lipid Panel No results found for: CHOL, TRIG, HDL, CHOLHDL, VLDL, LDLCALC, LDLDIRECT    Wt Readings from Last 3 Encounters:  04/24/17 142 lb 12.8 oz (64.8 kg)  03/26/17 142 lb 12.8 oz (64.8 kg)      ASSESSMENT AND PLAN:  1  PAF  Pt wore holter monitor which showed short bursts of atrial fib  Pt now on Xarelto  and Toprol XL 25 bid  Tolerating  Anxious   On exam still with skips  She does not sense I woud stop ASA   Continue meds  Check CBC in 6 wks   F/U in June    2CAD  Pt with evid of atherosclerosis on CT   Denies symptoms to sugg angina   Active   Needs tighter control of risk factors  Lipids pending on statin   Stop ASA since on Xarelto  3  HL  Started Crestor  Will get lipids in several wks     4  HTN  BP OK   Rechecking her cuff to our cuff     F/U in June 2019      Current medicines are reviewed at  length with the patient today.  The patient does not have concerns regarding medicines.  Signed, Dorris Carnes, MD  04/24/2017 8:33 AM    [facility] [address], [place], [place]  [address] Phone: [phone]; Fax: [phone]

## 2017-04-24 NOTE — Patient Instructions (Signed)
Your physician has recommended you make the following change in your medication:  1.) STOP ASPIRIN  Your physician recommends that you return for lab work in: TODAY (TSH) AND IN April (LIPIDS, CBC) --SEE BELOW  Your physician recommends that you schedule a follow-up appointment in: June, 2019 [place].

## 2017-04-27 ENCOUNTER — Other Ambulatory Visit: Payer: Self-pay

## 2017-04-27 ENCOUNTER — Telehealth: Payer: Self-pay | Admitting: Internal Medicine

## 2017-04-27 MED ORDER — RIVAROXABAN 20 MG PO TABS: 20.0000 mg | ORAL_TABLET | Freq: Every day | ORAL | 6 refills | Status: DC

## 2017-04-27 NOTE — Telephone Encounter (Signed)
-----   Message from Rodman Key, RN sent at 04/24/2017  9:57 AM EST ----- Regarding: Carly Myers, Carly Myers was in office today seeing Dr. Harrington Challenger.  She brought Patient Assistance Application back along with a tax return copy.  She also called the company who said it may be that she has to pay 4 percent of her income before she qualifies.   I also told her you could be her person to complete the form if she didn't complete it fully, but that you would need her to sign for permission first.  I will place the forms at your desk.  Thanks for your help. Michalene

## 2017-04-27 NOTE — Telephone Encounter (Addendum)
The pt is advised that she will need a 3 or 4% out of pocket expense report from her pharmacy before we can submit her [place] pt asst application. She is aware that I will hold on to her application and [id number] that she has already left at the office (placed in yellow folder in PA Dept) until she brings in her OUP expense report and that I will fax in at that time.  She verbalized understanding and thanked me for calling her.

## 2017-04-27 NOTE — Telephone Encounter (Signed)
New message      *STAT* If patient is at the pharmacy, call can be transferred to refill team.   1. Which medications need to be refilled? (please list name of each medication and dose if known) rivaroxaban (XARELTO) 20 MG TABS tablet  2. Which pharmacy/location (including street and city if local pharmacy) is medication to be sent to? [facility] [address]  3. Do they need a 30 day or 90 day supply? [place]

## 2017-04-28 MED ORDER — RIVAROXABAN 20 MG PO TABS: 20.0000 mg | ORAL_TABLET | Freq: Every day | ORAL | 6 refills | Status: DC

## 2017-05-20 ENCOUNTER — Other Ambulatory Visit: Payer: Medicare Other

## 2017-06-01 ENCOUNTER — Other Ambulatory Visit: Payer: Medicare Other

## 2017-06-08 ENCOUNTER — Other Ambulatory Visit: Payer: Medicare Other

## 2017-06-09 ENCOUNTER — Other Ambulatory Visit: Payer: Medicare Other | Admitting: *Deleted

## 2017-06-09 DIAGNOSIS — E7800 Pure hypercholesterolemia, unspecified: Secondary | ICD-10-CM | POA: Diagnosis not present

## 2017-06-09 LAB — LIPID PANEL
Chol/HDL Ratio: 2.7 ratio (ref 0.0–4.4)
Cholesterol, Total: 143 mg/dL (ref 100–199)
HDL: 53 mg/dL (ref >39)
LDL CALC: 78 mg/dL (ref 0–99)
Triglycerides: 60 mg/dL (ref 0–149)
VLDL Cholesterol Cal: 12 mg/dL (ref 5–40)

## 2017-06-10 DIAGNOSIS — M1712 Unilateral primary osteoarthritis, left knee: Secondary | ICD-10-CM | POA: Diagnosis not present

## 2017-06-25 DIAGNOSIS — M1712 Unilateral primary osteoarthritis, left knee: Secondary | ICD-10-CM | POA: Diagnosis not present

## 2017-07-02 DIAGNOSIS — M1712 Unilateral primary osteoarthritis, left knee: Secondary | ICD-10-CM | POA: Diagnosis not present

## 2017-07-06 ENCOUNTER — Other Ambulatory Visit: Payer: Self-pay

## 2017-07-06 ENCOUNTER — Encounter: Payer: Self-pay | Admitting: Internal Medicine

## 2017-07-06 MED ORDER — RIVAROXABAN 20 MG PO TABS: 20.0000 mg | ORAL_TABLET | Freq: Every day | ORAL | 3 refills | Status: DC

## 2017-07-06 NOTE — Telephone Encounter (Addendum)
The pt brought in her 2019 out of pocket expense report from her pharmacy. I have faxed it to [employer] pt assistance [employer] with a note on the cover letter "add to the pts application".

## 2017-07-27 ENCOUNTER — Ambulatory Visit (INDEPENDENT_AMBULATORY_CARE_PROVIDER_SITE_OTHER): Payer: Medicare Other | Admitting: Internal Medicine

## 2017-07-27 ENCOUNTER — Encounter: Payer: Self-pay | Admitting: Internal Medicine

## 2017-07-27 VITALS — BP 130/60 | HR 60 | Ht 66.0 in | Wt 131.8 lb

## 2017-07-27 DIAGNOSIS — E7800 Pure hypercholesterolemia, unspecified: Secondary | ICD-10-CM | POA: Diagnosis not present

## 2017-07-27 DIAGNOSIS — I480 Paroxysmal atrial fibrillation: Secondary | ICD-10-CM | POA: Diagnosis not present

## 2017-07-27 DIAGNOSIS — I2510 Atherosclerotic heart disease of native coronary artery without angina pectoris: Secondary | ICD-10-CM

## 2017-07-27 DIAGNOSIS — I10 Essential (primary) hypertension: Secondary | ICD-10-CM | POA: Diagnosis not present

## 2017-07-27 LAB — CBC
Hematocrit: 40.9 % (ref 34.0–46.6)
Hemoglobin: 13.4 g/dL (ref 11.1–15.9)
MCH: 32.1 pg (ref 26.6–33.0)
MCHC: 32.8 g/dL (ref 31.5–35.7)
MCV: 98 fL — AB (ref 79–97)
PLATELETS: 351 10*3/uL (ref 150–450)
RBC: 4.17 x10E6/uL (ref 3.77–5.28)
RDW: 13.8 % (ref 12.3–15.4)
WBC: 7.4 10*3/uL (ref 3.4–10.8)

## 2017-07-27 NOTE — Progress Notes (Signed)
Cardiology Office Note   Date:  07/27/2017   ID:  Carly Myers, DOB 1946/01/30, MRN [id number]  PCP:  Jonathon Jordan, MD  Cardiologist:   Dorris Carnes, MD   F/U of atrial fibrillatoin     History of Present Illness: Carly Myers is a 72 y.o. female I saw hier in Jan for abnormal chest CT Chest CT showed Aortic atherosclerosis noted, calcification of LAD noted   Pt also had interimtte afib /flutter on Holter  Started on Toprol XL and Xarelto   Echo hormal  I saw the pt in March 2019 Labs in December 2018 LDL 148, HDL 67    Pt denies fluttering   Feeling good   Active      Current Meds  Medication Sig  . Biotin w/ Vitamins C & E (HAIR/SKIN/NAILS PO) Take by mouth daily.  . Calcium Carb-Cholecalciferol (CALCIUM + D3) 600-200 MG-UNIT TABS Take 1 tablet by mouth daily.  . Cholecalciferol (VITAMIN D) 2000 units CAPS Take 2,000 Units by mouth daily.  Marland Kitchen EPINEPHrine 0.3 mg/0.3 mL IJ SOAJ injection Inject 0.3 mg into the muscle once.  Javier Docker Oil 1000 MG CAPS Take 1 capsule by mouth daily.  . metoprolol succinate (TOPROL-XL) 25 MG 24 hr tablet Take 1 tablet (25 mg total) by mouth 2 (two) times daily.  . rivaroxaban (XARELTO) 20 MG TABS tablet Take 1 tablet (20 mg total) by mouth daily with supper.  . rosuvastatin (CRESTOR) 20 MG tablet Take 1 tablet (20 mg total) by mouth daily.     Allergies:   Bee venom   Past Medical History:  Diagnosis Date  . Allergic rhinitis   . Anaphylactic reaction to bee sting   . Anxiety state    unspecified  . Arthritis    Ortho Dr. Berenice Primas  . Disorder of bone    unspecified  . Hypercholesteremia    migrated  . Hyperlipemia   . Low back pain   . Major depression    in complete remission  . Osteopenia   . Osteoporosis   . Post-menopausal   . Senile purpura ([place])   . Shingles   . Sinusitis   . UTI (urinary tract infection)   . Vitamin D deficiency     History reviewed. No pertinent surgical history.   Social History:  The patient   reports that she has never smoked. She has never used smokeless tobacco. She reports that she does not drink alcohol or use drugs.   Family History:  The patient's family history is not on file.    ROS:  Please see the history of present illness. All other systems are reviewed and  Negative to the above problem except as noted.    PHYSICAL EXAM: VS:  BP 130/60   Pulse 60   Ht 5\' 6"  (1.676 m)   Wt 59.8 kg (131 lb 12.8 oz)   SpO2 95%   BMI 21.27 kg/m   GEN: Well nourished, well developed, in no acute distress  HEENT: normal  Neck: JVP normal  No carotid bruits, or masses Cardiac: RRR with frequent skips ; no murmurs, rubs, or gallops,no edema  Respiratory:  clear to auscultation bilaterally, normal work of breathing GI: soft, nontender, nondistended, + BS  No hepatomegaly  MS: no deformity Moving all extremities   Skin: warm and dry, no rash Neuro:  Strength and sensation are intact Psych: euthymic mood, full affect   EKG:  EKG is not ordered today.   Lipid Panel  Component Value Date/Time   CHOL 143 06/09/2017 0915   TRIG 60 06/09/2017 0915   HDL 53 06/09/2017 0915   CHOLHDL 2.7 06/09/2017 0915   LDLCALC 78 06/09/2017 0915      Wt Readings from Last 3 Encounters:  07/27/17 59.8 kg (131 lb 12.8 oz)  04/24/17 64.8 kg (142 lb 12.8 oz)  03/26/17 64.8 kg (142 lb 12.8 oz)      ASSESSMENT AND PLAN:  1  PAF  DOing good  Does not sense palpitations   Keep on current regimen  Check CBC today   2CAD  Seen on CT scan   Keep on Crestor   3  HL  Started Crestor earlier this year   Lipids in April LDL was 78     4  HTN  BP is good   Keep on current meds    Stay active   F/U in Jan 2020    Current medicines are reviewed at length with the patient today.  The patient does not have concerns regarding medicines.  Signed, Dorris Carnes, MD  07/27/2017 9:18 AM    [facility], [place], [place]  [address] Phone: [phone]; Fax:  [phone]

## 2017-07-27 NOTE — Patient Instructions (Signed)
Your physician recommends that you continue on your current medications as directed. Please refer to the Current Medication list given to you today.  Your physician recommends that you return for lab work in: today (CBC)  Your physician wants you to follow-up in: Jan 2020 with Dr. Harrington Challenger.  You will receive a reminder letter in the mail two months in advance. If you don't receive a letter, please call our office to schedule the follow-up appointment.

## 2017-11-25 ENCOUNTER — Telehealth: Payer: Self-pay | Admitting: Internal Medicine

## 2017-11-25 MED ORDER — RIVAROXABAN 20 MG PO TABS: 20.0000 mg | ORAL_TABLET | Freq: Every day | ORAL | 2 refills | Status: DC

## 2017-11-25 NOTE — Telephone Encounter (Addendum)
Pt informed new Rx sent to pharmacy to be filled. Informed that the 90 day Rx I referred to earlier was actually printed and not sent electronically as I originally thought. Pt verbalized understanding.

## 2017-11-25 NOTE — Telephone Encounter (Signed)
Pt calling in stating her Xarelto bottle says no refills.  Informed pt that our records show 90 day supply of medication sent in May of this year with a year supply was sent to her pharmacy. Explained that I would contact her pharmacy to discuss and call her back with what I find. Pt agreeable to plan and appreciative of my help.

## 2017-11-25 NOTE — Telephone Encounter (Signed)
New Message         Patient is calling today to see if she is suppose to continue taking "Xareto" 20 mg. Patient states she couldn't refill it and she needs to know what she needs to do next. Pls call and advise.

## 2017-11-27 DIAGNOSIS — H52203 Unspecified astigmatism, bilateral: Secondary | ICD-10-CM | POA: Diagnosis not present

## 2017-11-27 DIAGNOSIS — Z961 Presence of intraocular lens: Secondary | ICD-10-CM | POA: Diagnosis not present

## 2017-11-27 DIAGNOSIS — H524 Presbyopia: Secondary | ICD-10-CM | POA: Diagnosis not present

## 2017-11-27 DIAGNOSIS — H26491 Other secondary cataract, right eye: Secondary | ICD-10-CM | POA: Diagnosis not present

## 2018-01-14 DIAGNOSIS — M1712 Unilateral primary osteoarthritis, left knee: Secondary | ICD-10-CM | POA: Diagnosis not present

## 2018-01-28 DIAGNOSIS — M1712 Unilateral primary osteoarthritis, left knee: Secondary | ICD-10-CM | POA: Diagnosis not present

## 2018-02-04 DIAGNOSIS — M1712 Unilateral primary osteoarthritis, left knee: Secondary | ICD-10-CM | POA: Diagnosis not present

## 2018-02-22 DIAGNOSIS — Z1231 Encounter for screening mammogram for malignant neoplasm of breast: Secondary | ICD-10-CM | POA: Diagnosis not present

## 2018-03-07 ENCOUNTER — Other Ambulatory Visit: Payer: Self-pay

## 2018-03-08 MED ORDER — ROSUVASTATIN CALCIUM 20 MG PO TABS: 20.0000 mg | ORAL_TABLET | Freq: Every day | ORAL | 3 refills | Status: DC

## 2018-03-29 ENCOUNTER — Ambulatory Visit (INDEPENDENT_AMBULATORY_CARE_PROVIDER_SITE_OTHER): Payer: Medicare Other | Admitting: Internal Medicine

## 2018-03-29 ENCOUNTER — Encounter: Payer: Self-pay | Admitting: Internal Medicine

## 2018-03-29 VITALS — BP 130/72 | HR 61 | Ht 66.0 in | Wt 127.4 lb

## 2018-03-29 DIAGNOSIS — I2510 Atherosclerotic heart disease of native coronary artery without angina pectoris: Secondary | ICD-10-CM | POA: Diagnosis not present

## 2018-03-29 DIAGNOSIS — R918 Other nonspecific abnormal finding of lung field: Secondary | ICD-10-CM | POA: Diagnosis not present

## 2018-03-29 DIAGNOSIS — E7800 Pure hypercholesterolemia, unspecified: Secondary | ICD-10-CM

## 2018-03-29 DIAGNOSIS — I480 Paroxysmal atrial fibrillation: Secondary | ICD-10-CM | POA: Diagnosis not present

## 2018-03-29 DIAGNOSIS — I10 Essential (primary) hypertension: Secondary | ICD-10-CM

## 2018-03-29 LAB — LIPID PANEL
CHOLESTEROL TOTAL: 158 mg/dL (ref 100–199)
Chol/HDL Ratio: 2.6 ratio (ref 0.0–4.4)
HDL: 61 mg/dL (ref >39)
LDL CALC: 81 mg/dL (ref 0–99)
TRIGLYCERIDES: 80 mg/dL (ref 0–149)
VLDL CHOLESTEROL CAL: 16 mg/dL (ref 5–40)

## 2018-03-29 LAB — CBC
Hematocrit: 40.8 % (ref 34.0–46.6)
Hemoglobin: 13.3 g/dL (ref 11.1–15.9)
MCH: 31.5 pg (ref 26.6–33.0)
MCHC: 32.6 g/dL (ref 31.5–35.7)
MCV: 97 fL (ref 79–97)
Platelets: 343 10*3/uL (ref 150–450)
RBC: 4.22 x10E6/uL (ref 3.77–5.28)
RDW: 12.3 % (ref 11.7–15.4)
WBC: 7.8 10*3/uL (ref 3.4–10.8)

## 2018-03-29 MED ORDER — METOPROLOL SUCCINATE ER 25 MG PO TB24: 12.5000 mg | ORAL_TABLET | Freq: Two times a day (BID) | ORAL | 11 refills | Status: DC

## 2018-03-29 NOTE — Progress Notes (Signed)
Cardiology Office Note   Date:  03/29/2018   ID:  Carly Myers, Carly Myers November 20, 1945, MRN [id number]  PCP:  Jonathon Jordan, MD  Cardiologist:   Dorris Carnes, MD   F/U of atrial fibrillatoin     History of Present Illness: Carly Myers is a 73 y.o. female I saw hier in Jan for abnormal chest CT Chest CT showed Aortic atherosclerosis wiht calcification of LAD noted   Pt also had interimttent afib /flutter on Holter  Started on Toprol XL and Xarelto   Echo hormal  I saw the pt in June 2019   Labs inApril 2019 LDL 78   HDL 60      Pt said several months ago she had some fluttering  Cut back on OJ and has had none since  Breathing is OK   NO CP   No dizziness   Current Meds  Medication Sig  . Biotin w/ Vitamins C & E (HAIR/SKIN/NAILS PO) Take by mouth daily.  . Calcium Carb-Cholecalciferol (CALCIUM + D3) 600-200 MG-UNIT TABS Take 1 tablet by mouth daily.  . Cholecalciferol (VITAMIN D) 2000 units CAPS Take 2,000 Units by mouth daily.  Marland Kitchen EPINEPHrine 0.3 mg/0.3 mL IJ SOAJ injection Inject 0.3 mg into the muscle once.  Javier Docker Oil 1000 MG CAPS Take 1 capsule by mouth daily.  . Magnesium Hydroxide (MAGNESIA PO) Take 1 tablet by mouth daily.  . metoprolol succinate (TOPROL-XL) 25 MG 24 hr tablet Take 1 tablet (25 mg total) by mouth 2 (two) times daily.  . rivaroxaban (XARELTO) 20 MG TABS tablet Take 1 tablet (20 mg total) by mouth daily with supper.  . rosuvastatin (CRESTOR) 20 MG tablet Take 1 tablet (20 mg total) by mouth daily.     Allergies:   Bee venom   Past Medical History:  Diagnosis Date  . Allergic rhinitis   . Anaphylactic reaction to bee sting   . Anxiety state    unspecified  . Arthritis    Ortho Dr. Berenice Primas  . Disorder of bone    unspecified  . Hypercholesteremia    migrated  . Hyperlipemia   . Low back pain   . Major depression    in complete remission  . Osteopenia   . Osteoporosis   . Post-menopausal   . Senile purpura ([place])   . Shingles   . Sinusitis   .  UTI (urinary tract infection)   . Vitamin D deficiency     No past surgical history on file.   Social History:  The patient  reports that she has never smoked. She has never used smokeless tobacco. She reports that she does not drink alcohol or use drugs.   Family History:  The patient's family history is not on file.    ROS:  Please see the history of present illness. All other systems are reviewed and  Negative to the above problem except as noted.    PHYSICAL EXAM: VS:  BP 130/72   Pulse 61   Ht 5\' 6"  (1.676 m)   Wt 127 lb 6.4 oz (57.8 kg)   SpO2 97%   BMI 20.56 kg/m   GEN: Well nourished, well developed, in no acute distress  HEENT: normal  Neck: JVP not elevated   No carotid bruits, or masses Cardiac: RRR with frequent skips ; no murmurs, rubs, or gallops,no edema  Respiratory:  clear to auscultation bilaterally, normal work of breathing GI: soft, nontender, nondistended, + BS  No hepatomegaly  MS: no deformity  Moving all extremities   Skin: warm and dry, no rash Neuro:  Strength and sensation are intact Psych: euthymic mood, full affect   EKG:  EKG is ordered today. SR 60 bpm   Septal MI    Lipid Panel    Component Value Date/Time   CHOL 143 06/09/2017 0915   TRIG 60 06/09/2017 0915   HDL 53 06/09/2017 0915   CHOLHDL 2.7 06/09/2017 0915   LDLCALC 78 06/09/2017 0915      Wt Readings from Last 3 Encounters:  03/29/18 127 lb 6.4 oz (57.8 kg)  07/27/17 131 lb 12.8 oz (59.8 kg)  04/24/17 142 lb 12.8 oz (64.8 kg)      ASSESSMENT AND PLAN:  1  PAF  Rare spell    Keep on Xarelto   Check CBC  2CAD  Seen on CT scan   Keep on Crestor  No symptoms to sugg angina    3  HL  On crestor   Will check lpids today     4  HTN  BP is good  She thinks metoprolol may be making her slugigish  Wll cut back to 1/2 tab bid  5  Lung nodule   Picked up on CT  Will sched for repeat as suggested     Stay active   Set to see in September/October     Current medicines  are reviewed at length with the patient today.  The patient does not have concerns regarding medicines.  Signed, Dorris Carnes, MD  03/29/2018 10:06 AM    [facility] [place], [place], [place]  [address] Phone: [phone]; Fax: [phone]

## 2018-03-29 NOTE — Patient Instructions (Signed)
Medication Instructions:  Your physician has recommended you make the following change in your medication:  1.) decrease metoprolol to 12.5 mg twice a day  If you need a refill on your cardiac medications before your next appointment, please call your pharmacy.   Lab work: Today: lipid panel, cbc If you have labs (blood work) drawn today and your tests are completely normal, you will receive your results only by: Marland Kitchen MyChart Message (if you have MyChart) OR . A paper copy in the mail If you have any lab test that is abnormal or we need to change your treatment, we will call you to review the results.  Testing/Procedures: Non-Cardiac CT scanning, (CAT scanning), is a noninvasive, special x-ray that produces cross-sectional images of the body using x-rays and a computer. CT scans help physicians diagnose and treat medical conditions. For some CT exams, a contrast material is used to enhance visibility in the area of the body being studied. CT scans provide greater clarity and reveal more details than regular x-ray exams.   Follow-Up: At [facility], you and your health needs are our priority.  As part of our continuing mission to provide you with exceptional heart care, we have created designated Provider Care Teams.  These Care Teams include your primary Cardiologist (physician) and Advanced Practice Providers (APPs -  Physician Assistants and Nurse Practitioners) who all work together to provide you with the care you need, when you need it. You will need a follow up appointment in:  7-8 months.  Please call our office 2 months in advance to schedule this appointment.  You may see Dr. Harrington Challenger or one of the following Advanced Practice Providers on your designated Care Team: Richardson Dopp, PA-C [place], [place] . Daune Perch, NP  Any Other Special Instructions Will Be Listed Below (If Applicable).

## 2018-04-05 ENCOUNTER — Other Ambulatory Visit: Payer: Self-pay | Admitting: *Deleted

## 2018-04-05 MED ORDER — ROSUVASTATIN CALCIUM 40 MG PO TABS: 40.0000 mg | ORAL_TABLET | Freq: Every day | ORAL | 3 refills | Status: DC

## 2018-04-09 DIAGNOSIS — D692 Other nonthrombocytopenic purpura: Secondary | ICD-10-CM | POA: Diagnosis not present

## 2018-04-09 DIAGNOSIS — Z0000 Encounter for general adult medical examination without abnormal findings: Secondary | ICD-10-CM | POA: Diagnosis not present

## 2018-04-09 DIAGNOSIS — M545 Low back pain: Secondary | ICD-10-CM | POA: Diagnosis not present

## 2018-04-09 DIAGNOSIS — Z79899 Other long term (current) drug therapy: Secondary | ICD-10-CM | POA: Diagnosis not present

## 2018-04-09 DIAGNOSIS — I480 Paroxysmal atrial fibrillation: Secondary | ICD-10-CM | POA: Diagnosis not present

## 2018-04-09 DIAGNOSIS — N390 Urinary tract infection, site not specified: Secondary | ICD-10-CM | POA: Diagnosis not present

## 2018-04-09 DIAGNOSIS — R399 Unspecified symptoms and signs involving the genitourinary system: Secondary | ICD-10-CM | POA: Diagnosis not present

## 2018-04-09 DIAGNOSIS — Z1211 Encounter for screening for malignant neoplasm of colon: Secondary | ICD-10-CM | POA: Diagnosis not present

## 2018-04-09 DIAGNOSIS — E559 Vitamin D deficiency, unspecified: Secondary | ICD-10-CM | POA: Diagnosis not present

## 2018-04-09 DIAGNOSIS — E785 Hyperlipidemia, unspecified: Secondary | ICD-10-CM | POA: Diagnosis not present

## 2018-04-09 DIAGNOSIS — I2510 Atherosclerotic heart disease of native coronary artery without angina pectoris: Secondary | ICD-10-CM | POA: Diagnosis not present

## 2018-04-09 DIAGNOSIS — M8580 Other specified disorders of bone density and structure, unspecified site: Secondary | ICD-10-CM | POA: Diagnosis not present

## 2018-04-09 LAB — BASIC METABOLIC PANEL
BUN: 12 (ref 4–21)
CO2: 27 — AB (ref 13–22)
Chloride: 104 (ref 99–108)
Creatinine: 0.8 (ref 0.5–1.1)
Glucose: 106
Potassium: 4.4 (ref 3.4–5.3)
Sodium: 138 (ref 137–147)

## 2018-04-09 LAB — CBC AND DIFFERENTIAL
HCT: 39 (ref 36–46)
Hemoglobin: 13 (ref 12.0–16.0)
Platelets: 304 (ref 150–399)
WBC: 6.9

## 2018-04-09 LAB — VITAMIN D 25 HYDROXY (VIT D DEFICIENCY, FRACTURES): Vit D, 25-Hydroxy: 69.2

## 2018-04-09 LAB — HEPATIC FUNCTION PANEL
ALT: 17 (ref 7–35)
AST: 21 (ref 13–35)
Alkaline Phosphatase: 46 (ref 25–125)

## 2018-04-09 LAB — TSH: TSH: 0.76 (ref 0.41–5.90)

## 2018-04-09 LAB — COMPREHENSIVE METABOLIC PANEL: Calcium: 9.4 (ref 8.7–10.7)

## 2018-04-15 ENCOUNTER — Ambulatory Visit (INDEPENDENT_AMBULATORY_CARE_PROVIDER_SITE_OTHER)
Admission: RE | Admit: 2018-04-15 | Discharge: 2018-04-15 | Disposition: A | Discharge status: Home / Self Care | Payer: Medicare Other | Source: Ambulatory Visit | Attending: Internal Medicine | Admitting: Internal Medicine

## 2018-04-15 DIAGNOSIS — R918 Other nonspecific abnormal finding of lung field: Secondary | ICD-10-CM | POA: Diagnosis not present

## 2018-04-28 ENCOUNTER — Telehealth: Payer: Self-pay | Admitting: *Deleted

## 2018-04-28 NOTE — Telephone Encounter (Signed)
Pt had chest ct on 04/15/18.  Per Dr. Harrington Challenger: "please call patient.  Tiny pulmonary nodues are benign.  No follow up indicated for now."  Left message for patient to call back to inform.

## 2018-04-29 NOTE — Telephone Encounter (Signed)
Follow up   Please call with CT results

## 2018-04-29 NOTE — Telephone Encounter (Signed)
The patient has been notified of the result and verbalized understanding.  All questions (if any) were answered. Cleon Gustin, RN 04/29/2018 10:07 AM

## 2018-05-20 ENCOUNTER — Telehealth: Payer: Self-pay

## 2018-05-20 ENCOUNTER — Telehealth: Payer: Self-pay | Admitting: Internal Medicine

## 2018-05-20 ENCOUNTER — Other Ambulatory Visit: Payer: Self-pay

## 2018-05-20 MED ORDER — RIVAROXABAN 20 MG PO TABS: 20.0000 mg | ORAL_TABLET | Freq: Every day | ORAL | 1 refills | Status: DC

## 2018-05-20 NOTE — Telephone Encounter (Signed)
Pt requesting refill for Xarelto 20 mg QD please address thank you. [place]

## 2018-05-20 NOTE — Addendum Note (Signed)
Addended by: Marcelle Overlie D on: 05/20/2018 04:25 PM   Modules accepted: Orders

## 2018-05-20 NOTE — Telephone Encounter (Signed)
 *  STAT* If patient is at the pharmacy, call can be transferred to refill team.   1. Which medications need to be refilled? (please list name of each medication and dose if known)   rosuvastatin (CRESTOR) 40 MG tablet rivaroxaban (XARELTO) 20 MG TABS tablet metoprolol succinate (TOPROL-XL) 25 MG 24 hr tablet   2. Which pharmacy/location (including street and city if local pharmacy) is medication to be sent to? [facility]  3. Do they need a 30 day or 90 day supply? [place]

## 2018-05-20 NOTE — Telephone Encounter (Signed)
Called pt to verify pharmacy for medications requested on mychart. She says she spoke with [facility] and they do a home delivery service. Pt did not want to go inside to get meds due to [place]. Instructed pt to call the office back if she has any issues but at this time she says she is fine. She has spoken with the pharmacy and they will deliver.

## 2018-05-20 NOTE — Telephone Encounter (Signed)
Scr 0.76 on 04/09/2018 per [facility] crcl 61ml/ml Last OV 04/09/2018

## 2018-06-09 ENCOUNTER — Other Ambulatory Visit: Payer: Self-pay | Admitting: Internal Medicine

## 2018-06-09 MED ORDER — METOPROLOL SUCCINATE ER 25 MG PO TB24: 12.5000 mg | ORAL_TABLET | Freq: Two times a day (BID) | ORAL | 3 refills | Status: DC

## 2018-06-29 DIAGNOSIS — N390 Urinary tract infection, site not specified: Secondary | ICD-10-CM | POA: Diagnosis not present

## 2018-06-29 DIAGNOSIS — F322 Major depressive disorder, single episode, severe without psychotic features: Secondary | ICD-10-CM | POA: Diagnosis not present

## 2018-09-09 DIAGNOSIS — M1712 Unilateral primary osteoarthritis, left knee: Secondary | ICD-10-CM | POA: Diagnosis not present

## 2018-10-19 DIAGNOSIS — F419 Anxiety disorder, unspecified: Secondary | ICD-10-CM | POA: Diagnosis not present

## 2018-10-19 DIAGNOSIS — B9689 Other specified bacterial agents as the cause of diseases classified elsewhere: Secondary | ICD-10-CM | POA: Diagnosis not present

## 2018-10-19 DIAGNOSIS — J019 Acute sinusitis, unspecified: Secondary | ICD-10-CM | POA: Diagnosis not present

## 2018-10-28 DIAGNOSIS — M1712 Unilateral primary osteoarthritis, left knee: Secondary | ICD-10-CM | POA: Diagnosis not present

## 2018-11-02 ENCOUNTER — Telehealth: Payer: Self-pay

## 2018-11-02 NOTE — Telephone Encounter (Signed)
Virtual Visit Pre-Appointment Phone Call   1. "What is the BEST phone number to call the day of the visit?" - include this in appointment notes  2. "Do you have or have access to (through a family member/friend) a smartphone with video capability that we can use for your visit?" a. If yes - list this number in appt notes as "cell" (if different from BEST phone #) and list the appointment type as a VIDEO visit in appointment notes b. If no - list the appointment type as a PHONE visit in appointment notes  3. Confirm consent - "In the setting of the current Covid19 crisis, you are scheduled for a (phone or video) visit with your provider on 9/10 at 8:40.  Just as we do with many in-office visits, in order for you to participate in this visit, we must obtain consent.  If you'd like, I can send this to your mychart (if signed up) or email for you to review.  Otherwise, I can obtain your verbal consent now.  All virtual visits are billed to your insurance company just like a normal visit would be.  By agreeing to a virtual visit, we'd like you to understand that the technology does not allow for your provider to perform an examination, and thus may limit your provider's ability to fully assess your condition. If your provider identifies any concerns that need to be evaluated in person, we will make arrangements to do so.  Finally, though the technology is pretty good, we cannot assure that it will always work on either your or our end, and in the setting of a video visit, we may have to convert it to a phone-only visit.  In either situation, we cannot ensure that we have a secure connection.  Are you willing to proceed?" STAFF: Did the patient verbally acknowledge consent to telehealth visit? Document YES/NO here: YES  4. Advise patient to be prepared - "Two hours prior to your appointment, go ahead and check your blood pressure, pulse, oxygen saturation, and your weight (if you have the equipment to  check those) and write them all down. When your visit starts, your provider will ask you for this information. If you have an Apple Watch or Kardia device, please plan to have heart rate information ready on the day of your appointment. Please have a pen and paper handy nearby the day of the visit as well."  5. Give patient instructions for MyChart download to smartphone OR Doximity/Doxy.me as below if video visit (depending on what platform provider is using)  6. Inform patient they will receive a phone call 15 minutes prior to their appointment time (may be from unknown caller ID) so they should be prepared to answer    TELEPHONE CALL NOTE  Carly Myers has been deemed a candidate for a follow-up tele-health visit to limit community exposure during the Covid-19 pandemic. I spoke with the patient via phone to ensure availability of phone/video source, confirm preferred email & phone number, and discuss instructions and expectations.  I reminded Carly Myers to be prepared with any vital sign and/or heart rhythm information that could potentially be obtained via home monitoring, at the time of her visit. I reminded Carly Myers to expect a phone call prior to her visit.  Carly Flavin, RN 11/02/2018 11:47 AM     FULL LENGTH CONSENT FOR TELE-HEALTH VISIT   I hereby voluntarily request, consent and authorize [facility] and its employed or contracted physicians,  physician assistants, nurse practitioners or other licensed health care professionals (the Practitioner), to provide me with telemedicine health care services (the "Services") as deemed necessary by the treating Practitioner. I acknowledge and consent to receive the Services by the Practitioner via telemedicine. I understand that the telemedicine visit will involve communicating with the Practitioner through live audiovisual communication technology and the disclosure of certain medical information by electronic transmission. I  acknowledge that I have been given the opportunity to request an in-person assessment or other available alternative prior to the telemedicine visit and am voluntarily participating in the telemedicine visit.  I understand that I have the right to withhold or withdraw my consent to the use of telemedicine in the course of my care at any time, without affecting my right to future care or treatment, and that the Practitioner or I may terminate the telemedicine visit at any time. I understand that I have the right to inspect all information obtained and/or recorded in the course of the telemedicine visit and may receive copies of available information for a reasonable fee.  I understand that some of the potential risks of receiving the Services via telemedicine include:  Marland Kitchen Delay or interruption in medical evaluation due to technological equipment failure or disruption; . Information transmitted may not be sufficient (e.g. poor resolution of images) to allow for appropriate medical decision making by the Practitioner; and/or  . In rare instances, security protocols could fail, causing a breach of personal health information.  Furthermore, I acknowledge that it is my responsibility to provide information about my medical history, conditions and care that is complete and accurate to the best of my ability. I acknowledge that Practitioner's advice, recommendations, and/or decision may be based on factors not within their control, such as incomplete or inaccurate data provided by me or distortions of diagnostic images or specimens that may result from electronic transmissions. I understand that the practice of medicine is not an exact science and that Practitioner makes no warranties or guarantees regarding treatment outcomes. I acknowledge that I will receive a copy of this consent concurrently upon execution via email to the email address I last provided but may also request a printed copy by calling the office of  [place].    I understand that my insurance will be billed for this visit.   I have read or had this consent read to me. . I understand the contents of this consent, which adequately explains the benefits and risks of the Services being provided via telemedicine.  . I have been provided ample opportunity to ask questions regarding this consent and the Services and have had my questions answered to my satisfaction. . I give my informed consent for the services to be provided through the use of telemedicine in my medical care  By participating in this telemedicine visit I agree to the above.

## 2018-11-04 ENCOUNTER — Telehealth (INDEPENDENT_AMBULATORY_CARE_PROVIDER_SITE_OTHER): Payer: Medicare Other | Admitting: Internal Medicine

## 2018-11-04 ENCOUNTER — Other Ambulatory Visit: Payer: Self-pay

## 2018-11-04 VITALS — BP 121/73 | HR 64

## 2018-11-04 DIAGNOSIS — I480 Paroxysmal atrial fibrillation: Secondary | ICD-10-CM

## 2018-11-04 DIAGNOSIS — M1712 Unilateral primary osteoarthritis, left knee: Secondary | ICD-10-CM | POA: Diagnosis not present

## 2018-11-04 DIAGNOSIS — E559 Vitamin D deficiency, unspecified: Secondary | ICD-10-CM

## 2018-11-04 DIAGNOSIS — I2510 Atherosclerotic heart disease of native coronary artery without angina pectoris: Secondary | ICD-10-CM

## 2018-11-04 MED ORDER — RIVAROXABAN 20 MG PO TABS: 20.0000 mg | ORAL_TABLET | Freq: Every day | ORAL | 1 refills | Status: DC

## 2018-11-04 NOTE — Progress Notes (Signed)
Virtual Visit via Video Note   This visit type was conducted due to national recommendations for restrictions regarding the COVID-19 Pandemic (e.g. social distancing) in an effort to limit this patient's exposure and mitigate transmission in our community.  Due to her co-morbid illnesses, this patient is at least at moderate risk for complications without adequate follow up.  This format is felt to be most appropriate for this patient at this time.  All issues noted in this document were discussed and addressed.  A limited physical exam was performed with this format.  Please refer to the patient's chart for her consent to telehealth for [facility].   Date:  11/04/2018   ID:  Carly Myers, DOB [birth year], MRN [id number]  Patient Location: Home Provider Location: Home  PCP:  Jonathon Jordan, MD  Cardiologist:  Dorris Carnes, MD  Electrophysiologist:  None   Evaluation Performed:  Follow-Up Visit  Chief Complaint:  F/U of CAD and HTN  History of Present Illness:    Carly Myers is a 73 y.o. female with hx of CAD by XT scan and intermitt atrial fib/flutter    ON Xarelto.   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    Past Medical History:  Diagnosis Date  . Allergic rhinitis   . Anaphylactic reaction to bee sting   . Anxiety state    unspecified  . Arthritis    Ortho Dr. Berenice Primas  . Disorder of bone    unspecified  . Hypercholesteremia    migrated  . Hyperlipemia   . Low back pain   . Major depression    in complete remission  . Osteopenia   . Osteoporosis   . Post-menopausal   . Senile purpura ([place])   . Shingles   . Sinusitis   . UTI (urinary tract infection)   . Vitamin D deficiency    No past surgical  history on file.   No outpatient medications have been marked as taking for the 11/04/18 encounter (Telemedicine) with [name], MD.     Allergies:   Bee venom   Social History   Tobacco Use  . Smoking status: Never Smoker  . Smokeless tobacco: Never Used  Substance Use Topics  . Alcohol use: Never    Frequency: Never  . Drug use: Never     Family Hx: The patient's family history is not on file.  ROS:   Please see the history of present illness.     All other systems reviewed and are negative.   Prior CV studies:   The following studies were reviewed today:    Labs/Other Tests and Data Reviewed:    EKG:  No ECG reviewed.  Recent Labs: 03/29/2018: Hemoglobin 13.3; Platelets 343   Recent Lipid Panel Lab Results  Component Value Date/Time   CHOL 158 03/29/2018 10:47 AM   TRIG 80 03/29/2018 10:47 AM   HDL 61 03/29/2018 10:47 AM   CHOLHDL 2.6 03/29/2018 10:47 AM   LDLCALC 81 03/29/2018 10:47 AM    Wt Readings from Last 3 Encounters:  03/29/18 127 lb 6.4 oz (57.8 kg)  07/27/17 131 lb 12.8 oz (59.8 kg)  04/24/17 142 lb 12.8 oz (64.8 kg)     Objective:    Vital Signs:    VITAL SIGNS:  reviewed VS not done   ASSESSMENT & PLAN:    1  PAF   Occasional spell where she feels her heart race   Self limited  No dizziness  Keep on current regimen    Can take extra metoprolol as needed for racing  2 CAD   NO symtposm to sugg angina    3  HL     Will get lipdis on higher dose of Crestor  4  ? CV dz    Pt worried about CV dz  Will set up for carotid arteries    Will get CBC, Vit D, TSH, lipids and BMET    Also sched Type and Cross   COVID-19 Education: The signs and symptoms of COVID-19 were discussed with the patient and how to seek care for testing (follow up with PCP or arrange E-visit).  The importance of social distancing was discussed today.  Time:   Today, I have spent 20 minutes with the patient with telehealth technology discussing the above  problems.     Medication Adjustments/Labs and Tests Ordered: Current medicines are reviewed at length with the patient today.  Concerns regarding medicines are outlined above.   Tests Ordered: No orders of the defined types were placed in this encounter.   Medication Changes: No orders of the defined types were placed in this encounter.   Follow Up:  In Person Next spring  Signed, Dorris Carnes, MD  11/04/2018 10:33 AM    [place]

## 2018-11-04 NOTE — Patient Instructions (Signed)
Medication Instructions:  No changes If you need a refill on your cardiac medications before your next appointment, please call your pharmacy.   Lab work: BMET, LIPIDS, CBC, VIT D - pt to notify office of when she will complete labs. If you have labs (blood work) drawn today and your tests are completely normal, you will receive your results only by: Marland Kitchen MyChart Message (if you have MyChart) OR . A paper copy in the mail If you have any lab test that is abnormal or we need to change your treatment, we will call you to review the results.  Testing/Procedures: Your physician has requested that you have a carotid duplex. This test is an ultrasound of the carotid arteries in your neck. It looks at blood flow through these arteries that supply the brain with blood. Allow one hour for this exam. There are no restrictions or special instructions.   Follow-Up: At [facility], you and your health needs are our priority.  As part of our continuing mission to provide you with exceptional heart care, we have created designated Provider Care Teams.  These Care Teams include your primary Cardiologist (physician) and Advanced Practice Providers (APPs -  Physician Assistants and Nurse Practitioners) who all work together to provide you with the care you need, when you need it. You will need a follow up appointment in:  4-5 months.  Please call our office 2 months in advance to schedule this appointment.  You may see Dorris Carnes, MD or one of the following Advanced Practice Providers on your designated Care Team: Richardson Dopp, PA-C [place], [place] . Daune Perch, NP  Any Other Special Instructions Will Be Listed Below (If Applicable). Pt has requested to know blood type.  Dr. Harrington Challenger recommends type and screen. I reviewed with patient this may not be covered by her insurance and that she would need to have it drawn at the hospital lab or possible a [facility].   I suggested she contact her insurance carrier  to find out the cost.  She has agreed to contact [facility] to find out if they are able to do and ask them for cost of the test and will let us know via [place].  At that point we will order the Type and Screen.  Pt in agreement with this.

## 2018-11-05 ENCOUNTER — Telehealth: Payer: Self-pay | Admitting: Internal Medicine

## 2018-11-05 NOTE — Telephone Encounter (Signed)
Set pt up for Type and screen for blood type, realizing pt may have to pay for test

## 2018-11-10 DIAGNOSIS — L720 Epidermal cyst: Secondary | ICD-10-CM | POA: Diagnosis not present

## 2018-11-10 DIAGNOSIS — L570 Actinic keratosis: Secondary | ICD-10-CM | POA: Diagnosis not present

## 2018-11-10 DIAGNOSIS — L820 Inflamed seborrheic keratosis: Secondary | ICD-10-CM | POA: Diagnosis not present

## 2018-11-10 DIAGNOSIS — D1801 Hemangioma of skin and subcutaneous tissue: Secondary | ICD-10-CM | POA: Diagnosis not present

## 2018-11-10 DIAGNOSIS — Z85828 Personal history of other malignant neoplasm of skin: Secondary | ICD-10-CM | POA: Diagnosis not present

## 2018-11-11 NOTE — Telephone Encounter (Signed)
Prescription written to be signed by Dr. Harrington Challenger and then will mail to patient

## 2018-11-15 DIAGNOSIS — M1712 Unilateral primary osteoarthritis, left knee: Secondary | ICD-10-CM | POA: Diagnosis not present

## 2018-11-17 ENCOUNTER — Other Ambulatory Visit: Payer: Self-pay

## 2018-11-17 ENCOUNTER — Ambulatory Visit (HOSPITAL_COMMUNITY)
Admission: RE | Admit: 2018-11-17 | Discharge: 2018-11-17 | Disposition: A | Discharge status: Home / Self Care | Payer: Medicare Other | Source: Ambulatory Visit | Attending: Cardiovascular Disease | Admitting: Cardiovascular Disease

## 2018-11-17 ENCOUNTER — Other Ambulatory Visit: Payer: Self-pay | Admitting: Internal Medicine

## 2018-11-17 DIAGNOSIS — I480 Paroxysmal atrial fibrillation: Secondary | ICD-10-CM

## 2018-11-17 DIAGNOSIS — I6523 Occlusion and stenosis of bilateral carotid arteries: Secondary | ICD-10-CM

## 2018-11-17 DIAGNOSIS — I2510 Atherosclerotic heart disease of native coronary artery without angina pectoris: Secondary | ICD-10-CM

## 2018-12-17 DIAGNOSIS — L819 Disorder of pigmentation, unspecified: Secondary | ICD-10-CM | POA: Diagnosis not present

## 2018-12-17 DIAGNOSIS — Z85828 Personal history of other malignant neoplasm of skin: Secondary | ICD-10-CM | POA: Diagnosis not present

## 2018-12-17 DIAGNOSIS — L570 Actinic keratosis: Secondary | ICD-10-CM | POA: Diagnosis not present

## 2018-12-17 DIAGNOSIS — D1801 Hemangioma of skin and subcutaneous tissue: Secondary | ICD-10-CM | POA: Diagnosis not present

## 2018-12-17 DIAGNOSIS — L821 Other seborrheic keratosis: Secondary | ICD-10-CM | POA: Diagnosis not present

## 2019-01-26 ENCOUNTER — Other Ambulatory Visit: Payer: Self-pay | Admitting: Internal Medicine

## 2019-01-26 MED ORDER — RIVAROXABAN 20 MG PO TABS: 20.0000 mg | ORAL_TABLET | Freq: Every day | ORAL | 1 refills | Status: DC

## 2019-01-26 MED ORDER — METOPROLOL SUCCINATE ER 25 MG PO TB24: 12.5000 mg | ORAL_TABLET | Freq: Two times a day (BID) | ORAL | 2 refills | Status: DC

## 2019-01-26 MED ORDER — ROSUVASTATIN CALCIUM 40 MG PO TABS: 40.0000 mg | ORAL_TABLET | Freq: Every day | ORAL | 2 refills | Status: DC

## 2019-01-26 NOTE — Telephone Encounter (Signed)
Pt wanted her medication sent to her new mail order pharmacy [facility]. Please address

## 2019-01-26 NOTE — Telephone Encounter (Signed)
Pt last saw Dr Harrington Challenger 11/04/18 telemedicine Covid-19, last labs 04/09/18 Creat 0.76 at [facility] per [facility], age 73, weight 57.8, CrCl 58.36, based on CrCl pt is on appropriate dosage of Xarelto 20mg  QD.  Will refill rx.

## 2019-02-03 ENCOUNTER — Other Ambulatory Visit: Payer: Self-pay | Admitting: Internal Medicine

## 2019-03-27 NOTE — Progress Notes (Signed)
Cardiology Office Note   Date:  03/28/2019   ID:  Carly Myers, DOB 03-07-1945, MRN [id number]  PCP:  Jonathon Jordan, MD  Cardiologist:   Dorris Carnes, MD   F/U of atrial fibrillatoin     History of Present Illness: Carly Myers is a 74 y.o. female I saw hier in Jan for abnormal chest CT Chest CT showed Aortic atherosclerosis wiht calcification of LAD noted   Pt also had interimttent afib /flutter on Holter  Started on Toprol XL and Xarelto   Echo hormal  I saw the patient 2020    Since seen the pt has done well  She denies CP  Breathing is OK  No dizziness   No SOB   No palpitations    Current Meds  Medication Sig  . Biotin w/ Vitamins C & E (HAIR/SKIN/NAILS PO) Take by mouth daily.  . Calcium Carb-Cholecalciferol (CALCIUM + D3) 600-200 MG-UNIT TABS Take 1 tablet by mouth daily.  . Cholecalciferol (VITAMIN D) 2000 units CAPS Take 2,000 Units by mouth daily.  Marland Kitchen EPINEPHrine 0.3 mg/0.3 mL IJ SOAJ injection Inject 0.3 mg into the muscle once.  Javier Docker Oil 1000 MG CAPS Take 1 capsule by mouth daily.  . Magnesium Hydroxide (MAGNESIA PO) Take 1 tablet by mouth daily.  . metoprolol succinate (TOPROL-XL) 25 MG 24 hr tablet TAKE 1/2 TABLET TWICE A DAY  . rivaroxaban (XARELTO) 20 MG TABS tablet Take 1 tablet (20 mg total) by mouth daily with supper.  . rosuvastatin (CRESTOR) 40 MG tablet Take 1 tablet (40 mg total) by mouth daily.     Allergies:   Bee venom   Past Medical History:  Diagnosis Date  . Allergic rhinitis   . Anaphylactic reaction to bee sting   . Anxiety state    unspecified  . Arthritis    Ortho Dr. Berenice Primas  . Disorder of bone    unspecified  . Hypercholesteremia    migrated  . Hyperlipemia   . Low back pain   . Major depression    in complete remission  . Osteopenia   . Osteoporosis   . Post-menopausal   . Senile purpura ([place])   . Shingles   . Sinusitis   . UTI (urinary tract infection)   . Vitamin D deficiency     No past surgical history on file.    Social History:  The patient  reports that she has never smoked. She has never used smokeless tobacco. She reports that she does not drink alcohol or use drugs.   Family History:  The patient's family history is not on file.    ROS:  Please see the history of present illness. All other systems are reviewed and  Negative to the above problem except as noted.    PHYSICAL EXAM: VS:  BP 136/70   Pulse (!) 59   Ht 5\' 6"  (1.676 m)   Wt 128 lb 9.6 oz (58.3 kg)   BMI 20.76 kg/m   GEN: Thin 74 yo , in no acute distress  HEENT: normal  Neck: JVP not elevated   No carotid bruits, Cardiac: RRR with frequent skips ; no murmurs, rubs, or gallops,no edema  Respiratory:  clear to auscultation bilaterally, normal work of breathing GI: soft, nontender, nondistended, + BS  No hepatomegaly  MS: no deformity Moving all extremities   Skin: warm and dry, no rash Neuro:  Strength and sensation are intact Psych: euthymic mood, full affect   EKG:  EKG is ordered  today. SR 59  bpm   Septal MI    Lipid Panel    Component Value Date/Time   CHOL 158 03/29/2018 1047   TRIG 80 03/29/2018 1047   HDL 61 03/29/2018 1047   CHOLHDL 2.6 03/29/2018 1047   LDLCALC 81 03/29/2018 1047      Wt Readings from Last 3 Encounters:  03/28/19 128 lb 9.6 oz (58.3 kg)  03/29/18 127 lb 6.4 oz (57.8 kg)  07/27/17 131 lb 12.8 oz (59.8 kg)      ASSESSMENT AND PLAN:  1  PAF  No palpitations  Keep on current regimen   Check CBC   2CAD Found on CT scan   Asymptomatic  Follow     3  HL  Continue statin  wIll check lipids    4  HTN  Tolerating current regimen   Continue    5  Lung nodule  F/U CT scan was stable       Encouraged her to stay active    Check:  CBC, BMET, Vit D, liipids   Set to see in Oct/November    Current medicines are reviewed at length with the patient today.  The patient does not have concerns regarding medicines.  Signed, Dorris Carnes, MD  03/28/2019 1:36 PM    [place]  [facility] [address], [place], [place]  [address] Phone: [phone]; Fax: [phone]

## 2019-03-28 ENCOUNTER — Other Ambulatory Visit: Payer: Self-pay

## 2019-03-28 ENCOUNTER — Ambulatory Visit (INDEPENDENT_AMBULATORY_CARE_PROVIDER_SITE_OTHER): Payer: Medicare Other | Admitting: Internal Medicine

## 2019-03-28 ENCOUNTER — Encounter: Payer: Self-pay | Admitting: Internal Medicine

## 2019-03-28 VITALS — BP 136/70 | HR 59 | Ht 66.0 in | Wt 128.6 lb

## 2019-03-28 DIAGNOSIS — E559 Vitamin D deficiency, unspecified: Secondary | ICD-10-CM | POA: Diagnosis not present

## 2019-03-28 DIAGNOSIS — I2510 Atherosclerotic heart disease of native coronary artery without angina pectoris: Secondary | ICD-10-CM | POA: Diagnosis not present

## 2019-03-28 DIAGNOSIS — E7800 Pure hypercholesterolemia, unspecified: Secondary | ICD-10-CM | POA: Diagnosis not present

## 2019-03-28 NOTE — Patient Instructions (Signed)
Medication Instructions:  No changes *If you need a refill on your cardiac medications before your next appointment, please call your pharmacy*  Lab Work: Today: lipids, vit d, bmet, cbc  If you have labs (blood work) drawn today and your tests are completely normal, you will receive your results only by: Marland Kitchen MyChart Message (if you have MyChart) OR . A paper copy in the mail If you have any lab test that is abnormal or we need to change your treatment, we will call you to review the results.  Testing/Procedures: none  Follow-Up: At [facility], you and your health needs are our priority.  As part of our continuing mission to provide you with exceptional heart care, we have created designated Provider Care Teams.  These Care Teams include your primary Cardiologist (physician) and Advanced Practice Providers (APPs -  Physician Assistants and Nurse Practitioners) who all work together to provide you with the care you need, when you need it.  Your next appointment:   9 month(s)  The format for your next appointment:   Either In Person or Virtual  Provider:   You may see Dorris Carnes, MD or one of the following Advanced Practice Providers on your designated Care Team:    Richardson Dopp, PA-C  [place], [place]  Daune Perch, NP   Other Instructions

## 2019-03-29 LAB — BASIC METABOLIC PANEL
BUN/Creatinine Ratio: 22 (ref 12–28)
BUN: 16 mg/dL (ref 8–27)
CO2: 23 mmol/L (ref 20–29)
Calcium: 9.4 mg/dL (ref 8.7–10.3)
Chloride: 104 mmol/L (ref 96–106)
Creatinine, Ser: 0.72 mg/dL (ref 0.57–1.00)
GFR calc Af Amer: 95 mL/min/{1.73_m2} (ref >59)
GFR calc non Af Amer: 82 mL/min/{1.73_m2} (ref >59)
Glucose: 91 mg/dL (ref 65–99)
Potassium: 4.6 mmol/L (ref 3.5–5.2)
Sodium: 140 mmol/L (ref 134–144)

## 2019-03-29 LAB — CBC
Hematocrit: 39.6 % (ref 34.0–46.6)
Hemoglobin: 12.9 g/dL (ref 11.1–15.9)
MCH: 32.2 pg (ref 26.6–33.0)
MCHC: 32.6 g/dL (ref 31.5–35.7)
MCV: 99 fL — ABNORMAL HIGH (ref 79–97)
Platelets: 292 10*3/uL (ref 150–450)
RBC: 4.01 x10E6/uL (ref 3.77–5.28)
RDW: 12.8 % (ref 11.7–15.4)
WBC: 9.3 10*3/uL (ref 3.4–10.8)

## 2019-03-29 LAB — LIPID PANEL
Chol/HDL Ratio: 2.3 ratio (ref 0.0–4.4)
Cholesterol, Total: 143 mg/dL (ref 100–199)
HDL: 63 mg/dL (ref >39)
LDL Chol Calc (NIH): 66 mg/dL (ref 0–99)
Triglycerides: 69 mg/dL (ref 0–149)
VLDL Cholesterol Cal: 14 mg/dL (ref 5–40)

## 2019-03-29 LAB — VITAMIN D 25 HYDROXY (VIT D DEFICIENCY, FRACTURES): Vit D, 25-Hydroxy: 57.8 ng/mL (ref 30.0–100.0)

## 2019-05-23 DIAGNOSIS — M25562 Pain in left knee: Secondary | ICD-10-CM | POA: Diagnosis not present

## 2019-05-23 DIAGNOSIS — M1712 Unilateral primary osteoarthritis, left knee: Secondary | ICD-10-CM | POA: Diagnosis not present

## 2019-06-16 DIAGNOSIS — M1712 Unilateral primary osteoarthritis, left knee: Secondary | ICD-10-CM | POA: Diagnosis not present

## 2019-06-23 DIAGNOSIS — M1712 Unilateral primary osteoarthritis, left knee: Secondary | ICD-10-CM | POA: Diagnosis not present

## 2019-06-30 DIAGNOSIS — M1712 Unilateral primary osteoarthritis, left knee: Secondary | ICD-10-CM | POA: Diagnosis not present

## 2019-08-06 ENCOUNTER — Other Ambulatory Visit: Payer: Self-pay | Admitting: Internal Medicine

## 2019-08-08 NOTE — Telephone Encounter (Signed)
Xarelto 20mg  refill request received. Pt is 74 years old, weight-58.3kg, Crea-0.72 on 03/28/19, last seen by Dr. Harrington Challenger on 03/28/2019, Diagnosis-Afib, CrCl-61.57ml/min; Dose is appropriate based on dosing criteria. Will send in refill to requested pharmacy.

## 2019-08-10 DIAGNOSIS — Z1231 Encounter for screening mammogram for malignant neoplasm of breast: Secondary | ICD-10-CM | POA: Diagnosis not present

## 2019-10-25 MED ORDER — METOPROLOL SUCCINATE ER 25 MG PO TB24: 12.5000 mg | ORAL_TABLET | Freq: Two times a day (BID) | ORAL | 1 refills | Status: DC

## 2019-10-25 MED ORDER — ROSUVASTATIN CALCIUM 40 MG PO TABS: 40.0000 mg | ORAL_TABLET | Freq: Every day | ORAL | 1 refills | Status: DC

## 2019-10-25 MED ORDER — RIVAROXABAN 20 MG PO TABS: ORAL_TABLET | ORAL | 1 refills | Status: DC

## 2019-12-01 DIAGNOSIS — Z23 Encounter for immunization: Secondary | ICD-10-CM | POA: Diagnosis not present

## 2019-12-21 DIAGNOSIS — L819 Disorder of pigmentation, unspecified: Secondary | ICD-10-CM | POA: Diagnosis not present

## 2019-12-21 DIAGNOSIS — D1801 Hemangioma of skin and subcutaneous tissue: Secondary | ICD-10-CM | POA: Diagnosis not present

## 2019-12-21 DIAGNOSIS — Z85828 Personal history of other malignant neoplasm of skin: Secondary | ICD-10-CM | POA: Diagnosis not present

## 2019-12-21 DIAGNOSIS — L821 Other seborrheic keratosis: Secondary | ICD-10-CM | POA: Diagnosis not present

## 2019-12-21 DIAGNOSIS — L570 Actinic keratosis: Secondary | ICD-10-CM | POA: Diagnosis not present

## 2019-12-26 ENCOUNTER — Encounter: Payer: Self-pay | Admitting: Internal Medicine

## 2019-12-26 ENCOUNTER — Other Ambulatory Visit: Payer: Self-pay

## 2019-12-26 ENCOUNTER — Ambulatory Visit (INDEPENDENT_AMBULATORY_CARE_PROVIDER_SITE_OTHER): Payer: Medicare Other | Admitting: Internal Medicine

## 2019-12-26 VITALS — BP 124/72 | HR 68 | Ht 66.0 in | Wt 123.2 lb

## 2019-12-26 DIAGNOSIS — I2510 Atherosclerotic heart disease of native coronary artery without angina pectoris: Secondary | ICD-10-CM

## 2019-12-26 DIAGNOSIS — I480 Paroxysmal atrial fibrillation: Secondary | ICD-10-CM | POA: Diagnosis not present

## 2019-12-26 LAB — CBC
Hematocrit: 39.5 % (ref 34.0–46.6)
Hemoglobin: 12.8 g/dL (ref 11.1–15.9)
MCH: 31.9 pg (ref 26.6–33.0)
MCHC: 32.4 g/dL (ref 31.5–35.7)
MCV: 99 fL — ABNORMAL HIGH (ref 79–97)
Platelets: 310 10*3/uL (ref 150–450)
RBC: 4.01 x10E6/uL (ref 3.77–5.28)
RDW: 12.3 % (ref 11.7–15.4)
WBC: 8.4 10*3/uL (ref 3.4–10.8)

## 2019-12-26 NOTE — Patient Instructions (Signed)
Medication Instructions:  No changes *If you need a refill on your cardiac medications before your next appointment, please call your pharmacy*   Lab Work: CBC If you have labs (blood work) drawn today and your tests are completely normal, you will receive your results only by: Marland Kitchen MyChart Message (if you have MyChart) OR . A paper copy in the mail If you have any lab test that is abnormal or we need to change your treatment, we will call you to review the results.   Testing/Procedures: none  Follow-Up: At [facility], you and your health needs are our priority.  As part of our continuing mission to provide you with exceptional heart care, we have created designated Provider Care Teams.  These Care Teams include your primary Cardiologist (physician) and Advanced Practice Providers (APPs -  Physician Assistants and Nurse Practitioners) who all work together to provide you with the care you need, when you need it.   Your next appointment:   6 month(s)  The format for your next appointment:   In Person  Provider:   You may see Dorris Carnes, MD or one of the following Advanced Practice Providers on your designated Care Team:    Richardson Dopp, PA-C  [place], [place]   Other Instructions

## 2019-12-26 NOTE — Progress Notes (Signed)
Cardiology Office Note   Date:  12/26/2019   ID:  [name], DOB October 31, 1945, MRN [id number]  PCP:  Leamon Arnt, MD  Cardiologist:   Dorris Carnes, MD   F/U of atrial fibrillation     History of Present Illness: Carly Myers is a 74 y.o. female with hx of CAD on CT  Chest CT showed Aortic atherosclerosis wiht calcification of LAD noted  The pt also had interimttent afib /flutter on Holter  Started on Toprol XL and Xarelto   Echo done which was normal    I saw the patient Feb 2021  SInce seen she continues to do well   Denies CP  Breathing is OK  Stays active    Notes occasional palpitations but no dizziness  Current Meds  Medication Sig  . Biotin w/ Vitamins C & E (HAIR/SKIN/NAILS PO) Take by mouth daily.  . Calcium Carb-Cholecalciferol (CALCIUM + D3) 600-200 MG-UNIT TABS Take 1 tablet by mouth daily.  . Cholecalciferol (VITAMIN D) 2000 units CAPS Take 2,000 Units by mouth daily.  Marland Kitchen EPINEPHrine 0.3 mg/0.3 mL IJ SOAJ injection Inject 0.3 mg into the muscle once.  Javier Docker Oil 1000 MG CAPS Take 1 capsule by mouth daily.  . Magnesium Hydroxide (MAGNESIA PO) Take 1 tablet by mouth daily.  . metoprolol succinate (TOPROL-XL) 25 MG 24 hr tablet Take 0.5 tablets (12.5 mg total) by mouth 2 (two) times daily.  . rivaroxaban (XARELTO) 20 MG TABS tablet TAKE 1 TABLET DAILY WITH SUPPER  . rosuvastatin (CRESTOR) 40 MG tablet Take 1 tablet (40 mg total) by mouth daily.     Allergies:   Bee venom   Past Medical History:  Diagnosis Date  . Allergic rhinitis   . Anaphylactic reaction to bee sting   . Anxiety state    unspecified  . Arthritis    Ortho Dr. Berenice Primas  . Disorder of bone    unspecified  . Hypercholesteremia    migrated  . Hyperlipemia   . Low back pain   . Major depression    in complete remission  . Osteopenia   . Osteoporosis   . Post-menopausal   . Senile purpura ([place])   . Shingles   . Sinusitis   . UTI (urinary tract infection)   . Vitamin D deficiency     No  past surgical history on file.   Social History:  The patient  reports that she has never smoked. She has never used smokeless tobacco. She reports that she does not drink alcohol and does not use drugs.   Family History:  The patient's family history is not on file.    ROS:  Please see the history of present illness. All other systems are reviewed and  Negative to the above problem except as noted.    PHYSICAL EXAM: VS:  BP 124/72   Pulse 68   Ht 5\' 6"  (1.676 m)   Wt 123 lb 3.2 oz (55.9 kg)   SpO2 97%   BMI 19.89 kg/m   GEN: Thin 74 yo , in no acute distress  HEENT: normal  Neck: JVP not elevated   No carotid bruits, Cardiac: RRR with frequent skips ; no murmurs, rubs, or gallops,no LE edema  Respiratory:  clear to auscultation bilaterally, normal work of breathing GI: soft, nontender, nondistended, + BS  No hepatomegaly  MS: no deformity Moving all extremities   Skin: warm and dry, no rash Neuro:  Strength and sensation are intact Psych: euthymic mood,  full affect   EKG:  EKG is ordered today. SR 59  bpm   Septal MI    Lipid Panel    Component Value Date/Time   CHOL 143 03/28/2019 1407   TRIG 69 03/28/2019 1407   HDL 63 03/28/2019 1407   CHOLHDL 2.3 03/28/2019 1407   LDLCALC 66 03/28/2019 1407      Wt Readings from Last 3 Encounters:  12/26/19 123 lb 3.2 oz (55.9 kg)  03/28/19 128 lb 9.6 oz (58.3 kg)  03/29/18 127 lb 6.4 oz (57.8 kg)      ASSESSMENT AND PLAN:  1  PAF   Pt notes occasional palpitations    Does not sound hemodynamically destabilizing  2 CAD Found on CT scan   Asymptomatic Continue to follow   3  HL  Continue statin  Lipids in Feb were excellent  LDL 66    4  HTN  BP is good    5  Lung nodule  F/U CT scan was stable  In 2020         Check:  CBC today    I will set to see in May 2021     Current medicines are reviewed at length with the patient today.  The patient does not have concerns regarding medicines.  Signed, Dorris Carnes,  MD  12/26/2019 10:19 AM    [facility] [place], [place], [place]  [address] Phone: [phone]; Fax: [phone]

## 2019-12-28 ENCOUNTER — Ambulatory Visit (INDEPENDENT_AMBULATORY_CARE_PROVIDER_SITE_OTHER): Payer: Medicare Other | Admitting: Family Medicine

## 2019-12-28 ENCOUNTER — Encounter: Payer: Self-pay | Admitting: Family Medicine

## 2019-12-28 ENCOUNTER — Other Ambulatory Visit: Payer: Self-pay

## 2019-12-28 VITALS — BP 166/83 | HR 51 | Temp 98.5°F | Ht 66.0 in | Wt 123.2 lb

## 2019-12-28 DIAGNOSIS — M8580 Other specified disorders of bone density and structure, unspecified site: Secondary | ICD-10-CM | POA: Diagnosis not present

## 2019-12-28 DIAGNOSIS — I480 Paroxysmal atrial fibrillation: Secondary | ICD-10-CM

## 2019-12-28 DIAGNOSIS — I10 Essential (primary) hypertension: Secondary | ICD-10-CM

## 2019-12-28 DIAGNOSIS — Z780 Asymptomatic menopausal state: Secondary | ICD-10-CM | POA: Diagnosis not present

## 2019-12-28 DIAGNOSIS — E782 Mixed hyperlipidemia: Secondary | ICD-10-CM | POA: Diagnosis not present

## 2019-12-28 DIAGNOSIS — Z7901 Long term (current) use of anticoagulants: Secondary | ICD-10-CM | POA: Diagnosis not present

## 2019-12-28 HISTORY — DX: Essential (primary) hypertension: I10

## 2019-12-28 HISTORY — DX: Paroxysmal atrial fibrillation: I48.0

## 2019-12-28 HISTORY — DX: Long term (current) use of anticoagulants: Z79.01

## 2019-12-28 MED ORDER — LORAZEPAM 0.5 MG PO TABS: 0.5000 mg | ORAL_TABLET | Freq: Every evening | ORAL | 0 refills | Status: DC | PRN

## 2019-12-28 MED ORDER — ROSUVASTATIN CALCIUM 40 MG PO TABS: 40.0000 mg | ORAL_TABLET | ORAL | 1 refills | Status: DC

## 2019-12-28 NOTE — Progress Notes (Signed)
Subjective  CC:   Chief Complaint  Patient presents with  . Establish Care    HPI: Carly Myers is a 74 y.o. female who presents to [place] at [place] today to establish care with me as a new patient.   She has the following concerns or needs:  Very pleasant 74 year old widowed female who lives alone.  No children.  Active and happy.  Dog longer.  Has good social network.  Formally a patient in [facility].  Also sees Dr. Harrington Challenger  of cardiology.  I reviewed cardiology notes.  Mostly healthy but has PAF and is on Xarelto and a low-dose beta-blocker.  No cardiovascular compromise.  No ischemia.  Feels well.  Most recent lab work from February 2021 was stable and she does have hyperlipidemia that is well controlled on a statin.  She admits she only takes this 3 times weekly due to leg cramps.  Chronic sun skin changes: Sees dermatology regularly.  History of osteopenia overdose for bone density scan.  Mammogram is up-to-date.  She uses [facility].  She defers morning vaccinations at this time.  Recently had Covid booster.  Feels well.  She uses rare lorazepam for sleep or anxiety related to dog shows.  She shows me a bottle that has several left, this was from 2019.  Assessment  1. PAF (paroxysmal atrial fibrillation) ([place])   2. Essential hypertension   3. Long term (current) use of anticoagulants   4. Asymptomatic menopause   5. Mixed hyperlipidemia   6. Osteopenia, unspecified location      Plan   PAF well-controlled on low-dose beta-blocker and anticoagulation.  Follows with cardiology annually  Hyperlipidemia and hypertension well-controlled.  No change in medications today.  Ordered bone density for osteopenia follow-up  Recommend annual physical in February with lab work.  Rare sleep or anxiety states: Lorazepam half dose as needed.  Follow up: Physical in April 15, 2008 Orders Placed This Encounter  Procedures  . DG Bone  Density   Meds ordered this encounter  Medications  . LORazepam (ATIVAN) 0.5 MG tablet    Sig: Take 1 tablet (0.5 mg total) by mouth at bedtime as needed for sleep.    Dispense:  30 tablet    Refill:  0  . rosuvastatin (CRESTOR) 40 MG tablet    Sig: Take 1 tablet (40 mg total) by mouth 3 (three) times a week.    Dispense:  90 tablet    Refill:  1     No flowsheet data found.  We updated and reviewed the patient's past history in detail and it is documented below.  Patient Active Problem List   Diagnosis Date Noted  . Essential hypertension 12/28/2019  . PAF (paroxysmal atrial fibrillation) ([place]) 12/28/2019  . Long term (current) use of anticoagulants 12/28/2019    PAF   . Mixed hyperlipidemia 03/25/2017  . Anxiety state 03/25/2017  . Vitamin deficiency 03/25/2017  . Osteopenia 03/25/2017  . Major depression 03/25/2017  . Senile purpura ([place]) 03/25/2017  . Allergic rhinitis 03/25/2017   Health Maintenance  Topic Date Due  . Hepatitis C Screening  Never done  . DEXA SCAN  Never done  . INFLUENZA VACCINE  05/24/2020 (Originally 09/25/2019)  . PNA vac Low Risk Adult (1 of 2 - PCV13) 06/25/2020 (Originally 07/21/2008)  . TETANUS/TDAP  12/27/2020 (Originally 07/22/1962)  . COVID-19 Vaccine  Completed   Immunization History  Administered Date(s) Administered  . PFIZER SARS-COV-2 Vaccination 04/01/2019, 12/01/2019  Current Meds  Medication Sig  . Biotin w/ Vitamins C & E (HAIR/SKIN/NAILS PO) Take by mouth daily.  . Calcium Carb-Cholecalciferol (CALCIUM + D3) 600-200 MG-UNIT TABS Take 1 tablet by mouth daily.  . Cholecalciferol (VITAMIN D) 2000 units CAPS Take 2,000 Units by mouth daily.  Marland Kitchen EPINEPHrine 0.3 mg/0.3 mL IJ SOAJ injection Inject 0.3 mg into the muscle once.  Javier Docker Oil 1000 MG CAPS Take 1 capsule by mouth daily.  . Magnesium Hydroxide (MAGNESIA PO) Take 1 tablet by mouth daily.  . metoprolol succinate (TOPROL-XL) 25 MG 24 hr tablet Take 0.5 tablets (12.5 mg  total) by mouth 2 (two) times daily.  . rivaroxaban (XARELTO) 20 MG TABS tablet TAKE 1 TABLET DAILY WITH SUPPER  . rosuvastatin (CRESTOR) 40 MG tablet Take 1 tablet (40 mg total) by mouth 3 (three) times a week.  . [DISCONTINUED] rosuvastatin (CRESTOR) 40 MG tablet Take 1 tablet (40 mg total) by mouth daily.    Allergies: Patient is allergic to bee venom. Past Medical History Patient  has a past medical history of Allergic rhinitis, Anaphylactic reaction to bee sting, Anxiety state, Arthritis, Disorder of bone, Essential hypertension (12/28/2019), Hypercholesteremia, Hyperlipemia, Long term (current) use of anticoagulants (12/28/2019), Low back pain, Major depression, Osteopenia, Osteoporosis, PAF (paroxysmal atrial fibrillation) ([place]) (12/28/2019), Post-menopausal, Senile purpura ([place]), Shingles, Sinusitis, UTI (urinary tract infection), and Vitamin D deficiency. Past Surgical History Patient  has no past surgical history on file. Family History: Patient family history is not on file. Social History:  Patient  reports that she has never smoked. She has never used smokeless tobacco. She reports that she does not drink alcohol and does not use drugs.  Review of Systems: Constitutional: negative for fever or malaise Ophthalmic: negative for photophobia, double vision or loss of vision Cardiovascular: negative for chest pain, dyspnea on exertion, or new LE swelling Respiratory: negative for SOB or persistent cough Gastrointestinal: negative for abdominal pain, change in bowel habits or melena Genitourinary: negative for dysuria or gross hematuria Musculoskeletal: negative for new gait disturbance or muscular weakness Integumentary: negative for new or persistent rashes Neurological: negative for TIA or stroke symptoms Psychiatric: negative for SI or delusions Allergic/Immunologic: negative for hives  Patient Care Team    Relationship Specialty Notifications Start End  Leamon Arnt, MD  PCP - General Family Medicine  12/26/19   [name], MD PCP - Cardiology Cardiology Admissions 03/29/18   Martinique, Amy, MD Consulting Physician Dermatology  12/28/19     Objective  Vitals: BP (!) 166/83   Pulse (!) 51   Temp 98.5 F (36.9 C) (Temporal)   Ht 5\' 6"  (1.676 m)   Wt 123 lb 3.2 oz (55.9 kg)   SpO2 98%   BMI 19.89 kg/m  General:  Well developed, well nourished, no acute distress  Psych:  Alert and oriented,normal mood and affect HEENT:  Normocephalic, atraumatic, non-icteric sclera, supple neck without adenopathy, mass or thyromegaly Cardiovascular: Irregularly irregular Respiratory:  Good breath sounds bilaterally, CTAB with normal respiratory effort MSK: no deformities, contusions. Joints are without erythema or swelling Skin:  Warm, chronic skin changes Neurologic:    Mental status is normal. Gross motor and sensory exams are normal. Normal gait, no tremor   Commons side effects, risks, benefits, and alternatives for medications and treatment plan prescribed today were discussed, and the patient expressed understanding of the given instructions. Patient is instructed to call or message via MyChart if he/she has any questions or concerns regarding our treatment  plan. No barriers to understanding were identified. We discussed Red Flag symptoms and signs in detail. Patient expressed understanding regarding what to do in case of urgent or emergency type symptoms.   Medication list was reconciled, printed and provided to the patient in AVS. Patient instructions and summary information was reviewed with the patient as documented in the AVS. This note was prepared with assistance of Dragon voice recognition software. Occasional wrong-word or sound-a-like substitutions may have occurred due to the inherent limitations of voice recognition software  This visit occurred during the SARS-CoV-2 public health emergency.  Safety protocols were in place, including screening questions prior  to the visit, additional usage of staff PPE, and extensive cleaning of exam room while observing appropriate contact time as indicated for disinfecting solutions.

## 2019-12-28 NOTE — Patient Instructions (Signed)
Please return in February or March 2022 for your complete physical. Please come fasting.   I've ordered a bone density [facility] at [facility].  Please contact [facility] to schedule your mammogram or bone density test as instructed: [facility] [place], [place], [place], [place] [address] Phone: [phone]  I have refilled your lorazepam for rare use.   It was a pleasure meeting you today! Thank you for choosing [place] to meet your healthcare needs! I truly look forward to working with you. If you have any questions or concerns, please send me a message via Mychart or call the office at [phone].

## 2020-02-13 DIAGNOSIS — H524 Presbyopia: Secondary | ICD-10-CM | POA: Diagnosis not present

## 2020-02-13 DIAGNOSIS — H52203 Unspecified astigmatism, bilateral: Secondary | ICD-10-CM | POA: Diagnosis not present

## 2020-02-13 DIAGNOSIS — Z961 Presence of intraocular lens: Secondary | ICD-10-CM | POA: Diagnosis not present

## 2020-02-13 DIAGNOSIS — H26491 Other secondary cataract, right eye: Secondary | ICD-10-CM | POA: Diagnosis not present

## 2020-02-23 DIAGNOSIS — M1712 Unilateral primary osteoarthritis, left knee: Secondary | ICD-10-CM | POA: Diagnosis not present

## 2020-02-23 DIAGNOSIS — M545 Low back pain, unspecified: Secondary | ICD-10-CM | POA: Diagnosis not present

## 2020-03-26 ENCOUNTER — Telehealth: Payer: Self-pay | Admitting: Family Medicine

## 2020-03-26 NOTE — Telephone Encounter (Signed)
Left message for patient to call back and schedule Medicare Annual Wellness Visit (AWV) either virtually or in office.   Last AWV  No information  please schedule at anytime with health coach  This should be a 45 minute visit.

## 2020-04-17 DIAGNOSIS — M25562 Pain in left knee: Secondary | ICD-10-CM | POA: Diagnosis not present

## 2020-04-17 DIAGNOSIS — M1712 Unilateral primary osteoarthritis, left knee: Secondary | ICD-10-CM | POA: Diagnosis not present

## 2020-04-23 ENCOUNTER — Other Ambulatory Visit: Payer: Self-pay | Admitting: Internal Medicine

## 2020-04-23 DIAGNOSIS — I480 Paroxysmal atrial fibrillation: Secondary | ICD-10-CM

## 2020-04-24 DIAGNOSIS — M1712 Unilateral primary osteoarthritis, left knee: Secondary | ICD-10-CM | POA: Diagnosis not present

## 2020-04-24 NOTE — Telephone Encounter (Signed)
Xarelto 20mg  refill request received. Pt is 75 years old, weight-55.9kg, Crea-0.72 on 03/28/2019-needs updated labs, last seen by Dr. Harrington Challenger on 12/26/2019 and pending appt 07/09/2020, Diagnosis-Afib, CrCl-58.42ml/min; Dose is appropriate based on dosing criteria. Will send in a 3 month supply mail order and place lab order to follow up on kidney function for same day.

## 2020-04-27 ENCOUNTER — Encounter: Payer: Medicare Other | Admitting: Family Medicine

## 2020-05-01 DIAGNOSIS — M1712 Unilateral primary osteoarthritis, left knee: Secondary | ICD-10-CM | POA: Diagnosis not present

## 2020-05-16 ENCOUNTER — Encounter: Payer: Self-pay | Admitting: Family Medicine

## 2020-05-16 ENCOUNTER — Other Ambulatory Visit: Payer: Self-pay

## 2020-05-16 ENCOUNTER — Ambulatory Visit (INDEPENDENT_AMBULATORY_CARE_PROVIDER_SITE_OTHER): Payer: Medicare Other | Admitting: Family Medicine

## 2020-05-16 VITALS — BP 124/68 | HR 68 | Temp 97.6°F | Resp 16 | Ht 66.0 in | Wt 121.2 lb

## 2020-05-16 DIAGNOSIS — E782 Mixed hyperlipidemia: Secondary | ICD-10-CM

## 2020-05-16 DIAGNOSIS — Z122 Encounter for screening for malignant neoplasm of respiratory organs: Secondary | ICD-10-CM | POA: Diagnosis not present

## 2020-05-16 DIAGNOSIS — Z780 Asymptomatic menopausal state: Secondary | ICD-10-CM

## 2020-05-16 DIAGNOSIS — I10 Essential (primary) hypertension: Secondary | ICD-10-CM | POA: Diagnosis not present

## 2020-05-16 DIAGNOSIS — F411 Generalized anxiety disorder: Secondary | ICD-10-CM

## 2020-05-16 DIAGNOSIS — D692 Other nonthrombocytopenic purpura: Secondary | ICD-10-CM | POA: Diagnosis not present

## 2020-05-16 DIAGNOSIS — I480 Paroxysmal atrial fibrillation: Secondary | ICD-10-CM

## 2020-05-16 DIAGNOSIS — Z1231 Encounter for screening mammogram for malignant neoplasm of breast: Secondary | ICD-10-CM | POA: Diagnosis not present

## 2020-05-16 DIAGNOSIS — M8580 Other specified disorders of bone density and structure, unspecified site: Secondary | ICD-10-CM

## 2020-05-16 DIAGNOSIS — Z7901 Long term (current) use of anticoagulants: Secondary | ICD-10-CM

## 2020-05-16 LAB — CBC WITH DIFFERENTIAL/PLATELET
Basophils Absolute: 0.1 10*3/uL (ref 0.0–0.1)
Basophils Relative: 1.2 % (ref 0.0–3.0)
Eosinophils Absolute: 0.1 10*3/uL (ref 0.0–0.7)
Eosinophils Relative: 1.8 % (ref 0.0–5.0)
HCT: 39.6 % (ref 36.0–46.0)
Hemoglobin: 13.1 g/dL (ref 12.0–15.0)
Lymphocytes Relative: 34.5 % (ref 12.0–46.0)
Lymphs Abs: 2.7 10*3/uL (ref 0.7–4.0)
MCHC: 33.1 g/dL (ref 30.0–36.0)
MCV: 96.3 fl (ref 78.0–100.0)
Monocytes Absolute: 0.8 10*3/uL (ref 0.1–1.0)
Monocytes Relative: 9.6 % (ref 3.0–12.0)
Neutro Abs: 4.1 10*3/uL (ref 1.4–7.7)
Neutrophils Relative %: 52.9 % (ref 43.0–77.0)
Platelets: 290 10*3/uL (ref 150.0–400.0)
RBC: 4.11 Mil/uL (ref 3.87–5.11)
RDW: 14 % (ref 11.5–15.5)
WBC: 7.8 10*3/uL (ref 4.0–10.5)

## 2020-05-16 LAB — COMPREHENSIVE METABOLIC PANEL
ALT: 17 U/L (ref 0–35)
AST: 24 U/L (ref 0–37)
Albumin: 4 g/dL (ref 3.5–5.2)
Alkaline Phosphatase: 41 U/L (ref 39–117)
BUN: 16 mg/dL (ref 6–23)
CO2: 27 mEq/L (ref 19–32)
Calcium: 8.9 mg/dL (ref 8.4–10.5)
Chloride: 103 mEq/L (ref 96–112)
Creatinine, Ser: 0.78 mg/dL (ref 0.40–1.20)
GFR: 73.57 mL/min (ref >60.00)
Glucose, Bld: 104 mg/dL — ABNORMAL HIGH (ref 70–99)
Potassium: 4 mEq/L (ref 3.5–5.1)
Sodium: 137 mEq/L (ref 135–145)
Total Bilirubin: 0.7 mg/dL (ref 0.2–1.2)
Total Protein: 6.6 g/dL (ref 6.0–8.3)

## 2020-05-16 LAB — LIPID PANEL
Cholesterol: 140 mg/dL (ref 0–200)
HDL: 58.9 mg/dL (ref >39.00)
LDL Cholesterol: 70 mg/dL (ref 0–99)
NonHDL: 81.02
Total CHOL/HDL Ratio: 2
Triglycerides: 57 mg/dL (ref 0.0–149.0)
VLDL: 11.4 mg/dL (ref 0.0–40.0)

## 2020-05-16 NOTE — Progress Notes (Signed)
Subjective  Chief Complaint  Patient presents with  . Annual Exam    Fasting     HPI: Carly Myers is a 75 y.o. female who presents to [place] at [place] today for a Female Wellness Visit. She also has the concerns and/or needs as listed above in the chief complaint. These will be addressed in addition to the Health Maintenance Visit.   Wellness Visit: annual visit with health maintenance review and exam without Pap   Health maintenance: Patient is overdue for mammogram and bone density.  She uses [facility].  Feels fine.  She declines all vaccinations at this time.   Chronic disease f/u and/or acute problem visit: (deemed necessary to be done in addition to the wellness visit):  PAF and hypertension: Well-controlled.  She denies chest pain or palpitations.  She does report she had some palpitations after the booster vaccination for COVID.  Those have resolved.  No lower extremity edema.  No shortness of breath.  On long-term anticoagulation without episodes of bleeding  Former smoker, quit 12 years ago.  Reviewed most recent chest CTs.  Has benign-appearing nodules.  Would be due for annual screening.  Hyperlipidemia on a statin that is well-tolerated she is fasting for lab work today.  Osteopenia: Counseling done to explain need for repeat bone density.  She has failed Fosamax in the past due to side effects and because she is on long-term anticoagulation.  She wonders if there is other treatments.  She does take calcium and vitamin D  Chronic skin changes related to sun.  Assessment  1. Essential hypertension   2. PAF (paroxysmal atrial fibrillation) ([place])   3. Long term (current) use of anticoagulants   4. Mixed hyperlipidemia   5. Anxiety state   6. Osteopenia, unspecified location   7. Encounter for screening mammogram for breast cancer   8. Asymptomatic menopausal state   9. Senile purpura ([facility]) Chronic  10. Encounter for screening for lung  cancer      Plan  Female Wellness Visit:  Age appropriate Health Maintenance and Prevention measures were discussed with patient. Included topics are cancer screening recommendations, ways to keep healthy (see AVS) including dietary and exercise recommendations, regular eye and dental care, use of seat belts, and avoidance of moderate alcohol use and tobacco use.  Recommend mammogram bone density and lung cancer screening  BMI: discussed patient's BMI and encouraged positive lifestyle modifications to help get to or maintain a target BMI.  HM needs and immunizations were addressed and ordered. See below for orders. See HM and immunization section for updates.  Educated on recommendations for Shingrix, pneumococcal vaccinations.  Patient declines both  Routine labs and screening tests ordered including cmp, cbc and lipids where appropriate.  Discussed recommendations regarding Vit D and calcium supplementation (see AVS)  Chronic disease management visit and/or acute problem visit:  Hypertension and PAF on long-term anticoagulation: Stable.  Continue current medications.  Recheck blood work  Recheck lipids on statin  Follow up: 6 months for recheck blood pressure Orders Placed This Encounter  Procedures  . MM DIGITAL SCREENING BILATERAL  . DG Bone Density  . CBC with Differential/Platelet  . Comprehensive metabolic panel  . Lipid panel  . Ambulatory Referral for Lung Cancer Scre   No orders of the defined types were placed in this encounter.     Body mass index is 19.56 kg/m. Wt Readings from Last 3 Encounters:  05/16/20 121 lb 3.2 oz (55 kg)  12/28/19 123 lb 3.2 oz (55.9 kg)  12/26/19 123 lb 3.2 oz (55.9 kg)     Patient Active Problem List   Diagnosis Date Noted  . Essential hypertension 12/28/2019  . PAF (paroxysmal atrial fibrillation) ([place]) 12/28/2019  . Long term (current) use of anticoagulants 12/28/2019    PAF   . Mixed hyperlipidemia 03/25/2017  . Anxiety  state 03/25/2017  . Vitamin deficiency 03/25/2017  . Osteopenia 03/25/2017  . Major depression 03/25/2017  . Senile purpura ([place]) 03/25/2017  . Allergic rhinitis 03/25/2017   Health Maintenance  Topic Date Due  . Hepatitis C Screening  Never done  . MAMMOGRAM  Never done  . DEXA SCAN  Never done  . INFLUENZA VACCINE  05/24/2020 (Originally 09/25/2019)  . PNA vac Low Risk Adult (1 of 2 - PCV13) 06/25/2020 (Originally 07/21/2008)  . TETANUS/TDAP  12/27/2020 (Originally 07/22/1962)  . COVID-19 Vaccine (3 - Booster for Pfizer series) 05/31/2020  . HPV VACCINES  Aged Out   Immunization History  Administered Date(s) Administered  . PFIZER(Purple Top)SARS-COV-2 Vaccination 04/01/2019, 12/01/2019   We updated and reviewed the patient's past history in detail and it is documented below. Allergies: Patient is allergic to bee venom. Past Medical History Patient  has a past medical history of Allergic rhinitis, Anaphylactic reaction to bee sting, Anxiety state, Arthritis, Disorder of bone, Essential hypertension (12/28/2019), Hypercholesteremia, Hyperlipemia, Long term (current) use of anticoagulants (12/28/2019), Low back pain, Major depression, Osteopenia, Osteoporosis, PAF (paroxysmal atrial fibrillation) ([place]) (12/28/2019), Post-menopausal, Senile purpura ([place]), Shingles, Sinusitis, UTI (urinary tract infection), and Vitamin D deficiency. Past Surgical History Patient  has no past surgical history on file. Family History: Patient family history is not on file. Social History:  Patient  reports that she has never smoked. She has never used smokeless tobacco. She reports that she does not drink alcohol and does not use drugs.  Review of Systems: Constitutional: negative for fever or malaise Ophthalmic: negative for photophobia, double vision or loss of vision Cardiovascular: negative for chest pain, dyspnea on exertion, or new LE swelling Respiratory: negative for SOB or persistent  cough Gastrointestinal: negative for abdominal pain, change in bowel habits or melena Genitourinary: negative for dysuria or gross hematuria, no abnormal uterine bleeding or disharge Musculoskeletal: negative for new gait disturbance or muscular weakness Integumentary: negative for new or persistent rashes, no breast lumps Neurological: negative for TIA or stroke symptoms Psychiatric: negative for SI or delusions Allergic/Immunologic: negative for hives  Patient Care Team    Relationship Specialty Notifications Start End  Leamon Arnt, MD PCP - General Family Medicine  12/26/19   [name], MD PCP - Cardiology Cardiology Admissions 03/29/18   Martinique, Amy, MD Consulting Physician Dermatology  12/28/19     Objective  Vitals: BP 124/68   Pulse 68   Temp 97.6 F (36.4 C) (Temporal)   Resp 16   Ht 5\' 6"  (1.676 m)   Wt 121 lb 3.2 oz (55 kg)   SpO2 97%   BMI 19.56 kg/m  General:  Well developed, well nourished, no acute distress  Psych:  Alert and orientedx3,normal mood and affect HEENT:  Normocephalic, atraumatic, non-icteric sclera,  supple neck without adenopathy, mass or thyromegaly Cardiovascular: Irregularly irregular without murmur Respiratory:  Good breath sounds bilaterally, CTAB with normal respiratory effort Gastrointestinal: normal bowel sounds, soft, non-tender, no noted masses. No HSM MSK: no deformities, contusions. Joints are without erythema or swelling.  Skin:  Warm, no rashes or suspicious lesions noted Neurologic:  Mental status is normal. CN 2-11 are normal. Gross motor and sensory exams are normal. Normal gait. No tremor Breast Exam: No mass, skin retraction or nipple discharge is appreciated in either breast. No axillary adenopathy. Fibrocystic changes are not noted    Commons side effects, risks, benefits, and alternatives for medications and treatment plan prescribed today were discussed, and the patient expressed understanding of the given instructions.  Patient is instructed to call or message via MyChart if he/she has any questions or concerns regarding our treatment plan. No barriers to understanding were identified. We discussed Red Flag symptoms and signs in detail. Patient expressed understanding regarding what to do in case of urgent or emergency type symptoms.   Medication list was reconciled, printed and provided to the patient in AVS. Patient instructions and summary information was reviewed with the patient as documented in the AVS. This note was prepared with assistance of Dragon voice recognition software. Occasional wrong-word or sound-a-like substitutions may have occurred due to the inherent limitations of voice recognition software  This visit occurred during the SARS-CoV-2 public health emergency.  Safety protocols were in place, including screening questions prior to the visit, additional usage of staff PPE, and extensive cleaning of exam room while observing appropriate contact time as indicated for disinfecting solutions.

## 2020-05-16 NOTE — Patient Instructions (Signed)
Please return in 6 months for hypertension follow up.  I will release your lab results to you on your MyChart account with further instructions. Please reply with any questions.   If you have any questions or concerns, please don't hesitate to send me a message via MyChart or call the office at [phone]. Thank you for visiting with [place] today! It's our pleasure caring for you.  I have ordered a mammogram and/or bone density for you as we discussed today: [x]   Mammogram  [x]   Bone Density  Please call the office checked below to schedule your appointment:  []   [facility]      [address] [place], [place]         [x]   [facility]  [address] [place], [place]

## 2020-05-21 ENCOUNTER — Encounter: Payer: Self-pay | Admitting: *Deleted

## 2020-05-21 ENCOUNTER — Other Ambulatory Visit: Payer: Self-pay | Admitting: *Deleted

## 2020-05-21 DIAGNOSIS — Z87891 Personal history of nicotine dependence: Secondary | ICD-10-CM

## 2020-06-13 ENCOUNTER — Ambulatory Visit (INDEPENDENT_AMBULATORY_CARE_PROVIDER_SITE_OTHER): Payer: Medicare Other | Admitting: Acute Care

## 2020-06-13 ENCOUNTER — Other Ambulatory Visit: Payer: Self-pay

## 2020-06-13 ENCOUNTER — Encounter: Payer: Self-pay | Admitting: Acute Care

## 2020-06-13 ENCOUNTER — Ambulatory Visit
Admission: RE | Admit: 2020-06-13 | Discharge: 2020-06-13 | Disposition: A | Discharge status: Home / Self Care | Payer: Medicare Other | Source: Ambulatory Visit | Attending: Acute Care | Admitting: Acute Care

## 2020-06-13 DIAGNOSIS — J432 Centrilobular emphysema: Secondary | ICD-10-CM | POA: Diagnosis not present

## 2020-06-13 DIAGNOSIS — J438 Other emphysema: Secondary | ICD-10-CM | POA: Diagnosis not present

## 2020-06-13 DIAGNOSIS — Z122 Encounter for screening for malignant neoplasm of respiratory organs: Secondary | ICD-10-CM

## 2020-06-13 DIAGNOSIS — I2510 Atherosclerotic heart disease of native coronary artery without angina pectoris: Secondary | ICD-10-CM | POA: Diagnosis not present

## 2020-06-13 DIAGNOSIS — Z87891 Personal history of nicotine dependence: Secondary | ICD-10-CM

## 2020-06-13 NOTE — Progress Notes (Signed)
Virtual Visit via Telephone Note  I connected with Carly Myers on 06/13/20 at 12:00 PM EDT by telephone and verified that I am speaking with the correct person using two identifiers.  Location: Patient: at Home Provider: [place], [place], [address]    I discussed the limitations, risks, security and privacy concerns of performing an evaluation and management service by telephone and the availability of in person appointments. I also discussed with the patient that there may be a patient responsible charge related to this service. The patient expressed understanding and agreed to proceed.    Shared Decision Making Visit Lung Cancer Screening Program [phone])   Eligibility:  Age 75 y.o.  Pack Years Smoking History Calculation 100 pack year smoking history (# packs/per year x # years smoked)  Recent History of coughing up blood  no  Unexplained weight loss? no ( >Than 15 pounds within the last 6 months )  Prior History Lung / other cancer no (Diagnosis within the last 5 years already requiring surveillance chest CT Scans).  Smoking Status Former Smoker  Former Smokers: Years since quit: 12 years  Quit Date: 2010  Visit Components:  Discussion included one or more decision making aids. yes  Discussion included risk/benefits of screening. yes  Discussion included potential follow up diagnostic testing for abnormal scans. yes  Discussion included meaning and risk of over diagnosis. yes  Discussion included meaning and risk of False Positives. yes  Discussion included meaning of total radiation exposure. yes  Counseling Included:  Importance of adherence to annual lung cancer LDCT screening. yes  Impact of comorbidities on ability to participate in the program. yes  Ability and willingness to under diagnostic treatment. yes  Smoking Cessation Counseling:  Current Smokers:   Discussed importance of smoking cessation. yes  Information about  tobacco cessation classes and interventions provided to patient. yes  Patient provided with "ticket" for LDCT Scan. yes  Symptomatic Patient. no  Counseling NA  Diagnosis Code: Tobacco Use Z72.0  Asymptomatic Patient yes  Counseling (Intermediate counseling: > three minutes counseling) [id number]  Former Smokers:   Discussed the importance of maintaining cigarette abstinence. yes  Diagnosis Code: Personal History of Nicotine Dependence. Q46.962  Information about tobacco cessation classes and interventions provided to patient. Yes  Patient provided with "ticket" for LDCT Scan. yes  Written Order for Lung Cancer Screening with LDCT placed in Epic. Yes (CT Chest Lung Cancer Screening Low Dose W/O CM) [id number] Z12.2-Screening of respiratory organs Z87.891-Personal history of nicotine dependence  I spent 25 minutes of face to face time with Ms. Lawhorne discussing the risks and benefits of lung cancer screening. We viewed a power point together that explained in detail the above noted topics. We took the time to pause the power point at intervals to allow for questions to be asked and answered to ensure understanding. We discussed that she had taken the single most powerful action possible to decrease her risk of developing lung cancer when she quit smoking. I counseled her to remain smoke free, and to contact me if she ever had the desire to smoke again so that I can provide resources and tools to help support the effort to remain smoke free. We discussed the time and location of the scan, and that either  Doroteo Glassman RN or I will call with the results within  24-48 hours of receiving them. She has my card and contact information in the event she needs to speak with me, in addition  to a copy of the power point we reviewed as a resource. She verbalized understanding of all of the above and had no further questions upon leaving the office.     I explained to the patient that there has been a high  incidence of coronary artery disease noted on these exams. I explained that this is a non-gated exam therefore degree or severity cannot be determined. This patient is currently on statin therapy. I have asked the patient to follow-up with their PCP regarding any incidental finding of coronary artery disease and management with diet or medication as they feel is clinically indicated. The patient verbalized understanding of the above and had no further questions.     Magdalen Spatz, NP 06/13/2020

## 2020-06-13 NOTE — Patient Instructions (Signed)
Thank you for participating in the [facility] Scre[facility]. It was our pleasure to meet you today. We will call you with the results of your scan within the next few days. Your scan will be assigned a Lung RADS category score by the physicians reading the scans.  This Lung RADS score determines follow up scanning.  See below for description of categories, and follow up screening recommendations. We will be in touch to schedule your follow up screening annually or based on recommendations of our providers. We will fax a copy of your scan results to your Primary Care Physician, or the physician who referred you to the program, to ensure they have the results. Please call the office if you have any questions or concerns regarding your scanning experience or results.  Our office number is [phone]. Please speak with Denise Phelps, RN. She is our Lung Cancer Screening RN. If she is unavailable when you call, please have the office staff send her a message. She will return your call at her earliest convenience. Remember, if your scan is normal, we will scan you annually as long as you continue to meet the criteria for the program. (Age 55-77, Current smoker or smoker who has quit within the last 15 years). If you are a smoker, remember, quitting is the single most powerful action that you can take to decrease your risk of lung cancer and other pulmonary, breathing related problems. We know quitting is hard, and we are here to help.  Please let us know if there is anything we can do to help you meet your goal of quitting. If you are a former smoker, congratulations. We are proud of you! Remain smoke free! Remember you can refer friends or family members through the number above.  We will screen them to make sure they meet criteria for the program. Thank you for helping us take better care of you by participating in Lung Screening.  Lung RADS Categories:  Lung RADS 1: no nodules  or definitely non-concerning nodules.  Recommendation is for a repeat annual scan in 12 months.  Lung RADS 2:  nodules that are non-concerning in appearance and behavior with a very low likelihood of becoming an active cancer. Recommendation is for a repeat annual scan in 12 months.  Lung RADS 3: nodules that are probably non-concerning , includes nodules with a low likelihood of becoming an active cancer.  Recommendation is for a 6-month repeat screening scan. Often noted after an upper respiratory illness. We will be in touch to make sure you have no questions, and to schedule your 6-month scan.  Lung RADS 4 A: nodules with concerning findings, recommendation is most often for a follow up scan in 3 months or additional testing based on our provider's assessment of the scan. We will be in touch to make sure you have no questions and to schedule the recommended 3 month follow up scan.  Lung RADS 4 B:  indicates findings that are concerning. We will be in touch with you to schedule additional diagnostic testing based on our provider's  assessment of the scan.   

## 2020-06-25 NOTE — Progress Notes (Signed)

## 2020-06-28 ENCOUNTER — Other Ambulatory Visit: Payer: Self-pay | Admitting: *Deleted

## 2020-06-28 DIAGNOSIS — Z87891 Personal history of nicotine dependence: Secondary | ICD-10-CM

## 2020-07-08 NOTE — Progress Notes (Signed)
Cardiology Office Note   Date:  07/09/2020   ID:  Carly Myers, DOB 23-May-1945, MRN [id number]  PCP:  Leamon Arnt, MD  Cardiologist:   Dorris Carnes, MD   F/U of atrial fibrillation     History of Present Illness: Carly Myers is a 75 y.o. female with hx of CAD on CT  Chest CT showed Aortic atherosclerosis wiht calcification of LAD noted  Carly pt also had interimttent afib /flutter on Holter  Started on Toprol XL and Xarelto   Echo done which was normal    I saw Carly pt in Nov 2021  Pt notes occasional palpitaitons   At night   No dizziness   Takes 1/2 metoprolol  no CP or pressure     Brathing is stable   No more smoking     Current Meds  Medication Sig  . Calcium Carb-Cholecalciferol (CALCIUM + D3) 600-200 MG-UNIT TABS Take 1 tablet by mouth daily.  . Cholecalciferol (VITAMIN D) 2000 units CAPS Take 2,000 Units by mouth daily.  Marland Kitchen EPINEPHrine 0.3 mg/0.3 mL IJ SOAJ injection Inject 0.3 mg into Carly muscle once.  Javier Docker Oil 1000 MG CAPS Take 1 capsule by mouth daily.  Marland Kitchen LORazepam (ATIVAN) 0.5 MG tablet Take 1 tablet (0.5 mg total) by mouth at bedtime as needed for sleep.  . Magnesium Hydroxide (MAGNESIA PO) Take 1 tablet by mouth daily.  . metoprolol succinate (TOPROL-XL) 25 MG 24 hr tablet TAKE ONE-HALF (1/2) TABLET TWICE A DAY  . rosuvastatin (CRESTOR) 40 MG tablet TAKE 1 TABLET DAILY  . [DISCONTINUED] XARELTO 20 MG TABS tablet TAKE 1 TABLET DAILY WITH SUPPER     Allergies:   Bee venom   Past Medical History:  Diagnosis Date  . Allergic rhinitis   . Anaphylactic reaction to bee sting   . Anxiety state    unspecified  . Arthritis    Ortho Dr. Berenice Primas  . Disorder of bone    unspecified  . Essential hypertension 12/28/2019  . Hypercholesteremia    migrated  . Hyperlipemia   . Long term (current) use of anticoagulants 12/28/2019   PAF  . Low back pain   . Major depression    in complete remission  . Osteopenia   . Osteoporosis   . PAF (paroxysmal atrial  fibrillation) ([place]) 12/28/2019  . Post-menopausal   . Senile purpura ([place])   . Shingles   . Sinusitis   . UTI (urinary tract infection)   . Vitamin D deficiency     No past surgical history on file.   Social History:  Carly Myers  reports that she quit smoking about 12 years ago. Her smoking use included cigarettes. She has never used smokeless tobacco. She reports that she does not drink alcohol and does not use drugs.   Family History:  Carly Myers's family history is not on file.    ROS:  Please see Carly history of present illness. All other systems are reviewed and  Negative to Carly above problem except as noted.    PHYSICAL EXAM: VS:  BP 138/72   Pulse 60   Ht 5\' 6"  (1.676 m)   Wt 122 lb (55.3 kg)   BMI 19.69 kg/m   GEN: Thin 75 yo , in no acute distress  HEENT: normal  Neck: JVP not elevated   No carotid bruits, Cardiac: RRR with frequent skips ; no murmurs, rubs, or gallops,no LE edema  Respiratory:  clear to auscultation bilaterally, normal work of  breathing GI: soft, nontender, nondistended, + BS  No hepatomegaly  MS: no deformity Moving all extremities   Skin: warm and dry, no rash Neuro:  Strength and sensation are intact Psych: euthymic mood, full affect   EKG:  EKG is ordered today. SR 60   bpm   Possible anteroseptal MI  Lipid Panel    Component Value Date/Time   CHOL 140 05/16/2020 1022   CHOL 143 03/28/2019 1407   TRIG 57.0 05/16/2020 1022   HDL 58.90 05/16/2020 1022   HDL 63 03/28/2019 1407   CHOLHDL 2 05/16/2020 1022   VLDL 11.4 05/16/2020 1022   LDLCALC 70 05/16/2020 1022   LDLCALC 66 03/28/2019 1407      Wt Readings from Last 3 Encounters:  07/09/20 122 lb (55.3 kg)  05/16/20 121 lb 3.2 oz (55 kg)  12/28/19 123 lb 3.2 oz (55.9 kg)      ASSESSMENT AND PLAN:  1  PAF   Myers with occasional palpitations    She occasional extra metoprolol   Doing well   CBC is OK   Follow   2 CAD Found on CT scan  Remains asymptomatic     3  HL  Last  lipids are good  LDL 70  HDL 59   Follow   4  HTN  BP is fair  Follow     5  Lung nodule  Continues to get periodic screenings        Check:  CBC today    I will set to see in May 2021     Current medicines are reviewed at length with Carly Myers today.  Carly Myers does not have concerns regarding medicines.  Signed, Dorris Carnes, MD  07/09/2020 9:17 AM    [address], [place], [place]  [id number] Phone: [phone]; Fax: [phone]

## 2020-07-09 ENCOUNTER — Ambulatory Visit (INDEPENDENT_AMBULATORY_CARE_PROVIDER_SITE_OTHER): Payer: Medicare Other | Admitting: Internal Medicine

## 2020-07-09 ENCOUNTER — Encounter: Payer: Self-pay | Admitting: Internal Medicine

## 2020-07-09 ENCOUNTER — Other Ambulatory Visit: Payer: Medicare Other

## 2020-07-09 ENCOUNTER — Other Ambulatory Visit: Payer: Self-pay

## 2020-07-09 VITALS — BP 138/72 | HR 60 | Ht 66.0 in | Wt 122.0 lb

## 2020-07-09 DIAGNOSIS — I480 Paroxysmal atrial fibrillation: Secondary | ICD-10-CM

## 2020-07-09 MED ORDER — RIVAROXABAN 20 MG PO TABS: ORAL_TABLET | ORAL | 3 refills | Status: DC

## 2020-07-09 NOTE — Patient Instructions (Addendum)
Medication Instructions:  No changes *If you need a refill on your cardiac medications before your next appointment, please call your pharmacy*   Lab Work: none If you have labs (blood work) drawn today and your tests are completely normal, you will receive your results only by: MyChart Message (if you have MyChart) OR A paper copy in the mail If you have any lab test that is abnormal or we need to change your treatment, we will call you to review the results.   Testing/Procedures: none   Follow-Up: At [facility], you and your health needs are our priority.  As part of our continuing mission to provide you with exceptional heart care, we have created designated Provider Care Teams.  These Care Teams include your primary Cardiologist (physician) and Advanced Practice Providers (APPs -  Physician Assistants and Nurse Practitioners) who all work together to provide you with the care you need, when you need it.  Your next appointment:   9 month(s)  The format for your next appointment:   In Person  Provider:   You may see Paula Ross, MD or one of the following Advanced Practice Providers on your designated Care Team:   Scott Weaver, PA-C Vin Bhagat, PA-C   Other Instructions   

## 2020-08-15 DIAGNOSIS — M8589 Other specified disorders of bone density and structure, multiple sites: Secondary | ICD-10-CM | POA: Diagnosis not present

## 2020-08-15 DIAGNOSIS — Z1231 Encounter for screening mammogram for malignant neoplasm of breast: Secondary | ICD-10-CM | POA: Diagnosis not present

## 2020-08-15 LAB — HM DEXA SCAN

## 2020-08-15 LAB — HM MAMMOGRAPHY

## 2020-09-02 DIAGNOSIS — Z20822 Contact with and (suspected) exposure to covid-19: Secondary | ICD-10-CM | POA: Diagnosis not present

## 2020-09-05 ENCOUNTER — Telehealth: Payer: Self-pay | Admitting: Family Medicine

## 2020-09-05 NOTE — Telephone Encounter (Signed)
Spoke with patient she declined the AWV and do not want any more calls.

## 2020-09-05 NOTE — Telephone Encounter (Signed)
Copied from [place] [phone]. Topic: Medicare AWV >> Sep 05, 2020 10:38 AM Harris-Coley, Hannah Beat wrote: Reason for CRM: Left message for patient to schedule Annual Wellness Visit.  Please schedule with Nurse Health Advisor Charlott Rakes, RN at [facility].

## 2020-09-17 ENCOUNTER — Telehealth: Payer: Self-pay

## 2020-09-17 DIAGNOSIS — R42 Dizziness and giddiness: Secondary | ICD-10-CM | POA: Diagnosis not present

## 2020-09-17 NOTE — Telephone Encounter (Signed)
Nurse Assessment Nurse: Valentino Nose, RN, [place] Date/Time (Eastern Time): 09/17/2020 8:33:12 AM Confirm and document reason for call. If symptomatic, describe symptoms. ---Caller states she has vertigo and she feels like it is getting worse. She states she is nauseous, dizzy, and unbalanced. Does the patient have any new or worsening symptoms? ---Yes Will a triage be completed? ---Yes Related visit to physician within the last 2 weeks? ---No Does the PT have any chronic conditions? (i.e. diabetes, asthma, this includes High risk factors for pregnancy, etc.) ---Yes List chronic conditions. ---A fib Is this a behavioral health or substance abuse call? ---No Guidelines Guideline Title Affirmed Question Affirmed Notes Nurse Date/Time (Eastern Time) Dizziness - Vertigo SEVERE dizziness (vertigo) (e.g., unable to walk without assistance) Valentino Nose, RN, [place] 09/17/2020 8:34:03 AM PLEASE NOTE: All timestamps contained within this report are represented as [place] Standard Time. CONFIDENTIALTY NOTICE: This fax transmission is intended only for the addressee. It contains information that is legally privileged, confidential or otherwise protected from use or disclosure. If you are not the intended recipient, you are strictly prohibited from reviewing, disclosing, copying using or disseminating any of this information or taking any action in reliance on or regarding this information. If you have received this fax in error, please notify us immediately by telephone so that we can arrange for its return to [place]. Phone: [phone], Toll-Free: [phone], Fax: [phone] Page: 2 of 2 Call Id: [id number] [place]. Time Eilene Ghazi Time) Disposition Final User 09/17/2020 8:36:44 AM Go to ED Now (or PCP triage) Yes Valentino Nose, RN, Arnetha Courser Disagree/Comply Comply Caller Understands Yes PreDisposition Call Doctor Care Advice Given Per Guideline GO TO ED NOW (OR PCP TRIAGE): Referrals GO TO FACILITY  UNDECIDED

## 2020-09-18 NOTE — Telephone Encounter (Signed)
Left detailed message on personal voicemail calling to f/u on Triage message from yesterday wanted to make sure you went to an Urgent Care or ED to be evaluated. Please call back and give [place] an update.

## 2020-09-18 NOTE — Telephone Encounter (Signed)
Patient called back and stated she did go to UC but is still feeling awful scheduled patient appt with Dr. Jonni Sanger for 09/19/20 at 4pm.

## 2020-09-19 ENCOUNTER — Ambulatory Visit (INDEPENDENT_AMBULATORY_CARE_PROVIDER_SITE_OTHER): Payer: Medicare Other | Admitting: Family Medicine

## 2020-09-19 ENCOUNTER — Other Ambulatory Visit: Payer: Self-pay

## 2020-09-19 ENCOUNTER — Encounter: Payer: Self-pay | Admitting: Family Medicine

## 2020-09-19 VITALS — BP 148/82 | HR 68 | Temp 98.1°F | Wt 122.8 lb

## 2020-09-19 DIAGNOSIS — R270 Ataxia, unspecified: Secondary | ICD-10-CM | POA: Diagnosis not present

## 2020-09-19 DIAGNOSIS — R42 Dizziness and giddiness: Secondary | ICD-10-CM

## 2020-09-19 DIAGNOSIS — R278 Other lack of coordination: Secondary | ICD-10-CM | POA: Diagnosis not present

## 2020-09-19 DIAGNOSIS — H5500 Unspecified nystagmus: Secondary | ICD-10-CM

## 2020-09-19 DIAGNOSIS — Z7901 Long term (current) use of anticoagulants: Secondary | ICD-10-CM | POA: Diagnosis not present

## 2020-09-19 DIAGNOSIS — I480 Paroxysmal atrial fibrillation: Secondary | ICD-10-CM

## 2020-09-19 MED ORDER — METHYLPREDNISOLONE 4 MG PO TBPK: ORAL_TABLET | ORAL | 0 refills | Status: DC

## 2020-09-19 NOTE — Progress Notes (Signed)
Subjective  CC:  Chief Complaint  Patient presents with   Dizziness    Started on Saturday, seen by [facility] on Monday - given meclizine, states this made symptoms worse.  Extremely off balance, almost fell sitting down in the exam room today     HPI: Carly Myers is a 75 y.o. female who presents to the office today to address the problems listed above in the chief complaint. 75 year old female with history of PAF on Xarelto presents for persistent and worsening vertigo.  As above, symptoms started 5 days ago.  She noted dizziness with getting out of the bed.  She took Dramamine immediately and felt that she slept for the rest of the day.  The symptoms persisted and were associated with difficulty walking and nausea with vomiting.  She sought care at an urgent care.  I reviewed that note.  Physical exam at that time was noted for normal strength, gait and neuro exam.  She was treated for benign positional vertigo with meclizine.  However, her symptoms persist and are slightly worse.  She is having trouble walking due to imbalance issues.  She continues to have dizziness with any head movement but also feels off while sitting.  She feels that her vision is not normal but denies diplopia.  She has a slight headache.  She has some nausea but no longer vomiting.  She denies paresis.  No dysarthria.  No problems swallowing.  No fevers or chills.  No ear pain.  No recent URIs.  Assessment  1. Vertigo   2. Nystagmus   3. Dysmetria   4. Ataxia   5. PAF (paroxysmal atrial fibrillation) ([place])   6. Long term (current) use of anticoagulants      Plan  Vertigo: Exam is positive for gait abnormality with ataxia, poor balance, slight dysmetria and horizontal nystagmus with right gaze.  Cranial nerves are normal.  Strength is normal.  Differential diagnosis includes vestibular neuritis, or central vertigo.  Therefore recommend brain MRI to rule out central etiology.  We will treat for vestibular neuritis with  Medrol Dosepak.  Education for safety given.  Recommend patient limit driving due to her symptoms.  She is using walker at home to prevent falls.  I encouraged her to ask for help from friends.  She has no nearby family.  She does live alone.  Of note, she is caring for 11 dogs in the home.  If any symptoms worsen, recommend ER evaluation.  Patient understands and agrees with care plan  Follow up: 1 week for recheck. 09/27/2020  Orders Placed This Encounter  Procedures   MR Brain W Wo Contrast   Meds ordered this encounter  Medications   DISCONTD: methylPREDNISolone (MEDROL DOSEPAK) 4 MG TBPK tablet    Sig: Take as directed    Dispense:  21 each    Refill:  0   methylPREDNISolone (MEDROL DOSEPAK) 4 MG TBPK tablet    Sig: Take as directed    Dispense:  21 each    Refill:  0    Please deliver to home address; pt cannot drive      I reviewed the patients updated PMH, FH, and SocHx.    Patient Active Problem List   Diagnosis Date Noted   Essential hypertension 12/28/2019   PAF (paroxysmal atrial fibrillation) ([place]) 12/28/2019   Long term (current) use of anticoagulants 12/28/2019   Mixed hyperlipidemia 03/25/2017   Anxiety state 03/25/2017   Vitamin deficiency 03/25/2017   Osteopenia 03/25/2017  Major depression 03/25/2017   Senile purpura ([place]) 03/25/2017   Allergic rhinitis 03/25/2017   Current Meds  Medication Sig   Calcium Carb-Cholecalciferol (CALCIUM + D3) 600-200 MG-UNIT TABS Take 1 tablet by mouth daily.   Cholecalciferol (VITAMIN D) 2000 units CAPS Take 2,000 Units by mouth daily.   EPINEPHrine 0.3 mg/0.3 mL IJ SOAJ injection Inject 0.3 mg into the muscle once.   Krill Oil 1000 MG CAPS Take 1 capsule by mouth daily.   LORazepam (ATIVAN) 0.5 MG tablet Take 1 tablet (0.5 mg total) by mouth at bedtime as needed for sleep.   Magnesium Hydroxide (MAGNESIA PO) Take 1 tablet by mouth daily.   meclizine (ANTIVERT) 12.5 MG tablet Take 12.5 mg by mouth 3 (three) times daily as  needed for dizziness.   metoprolol succinate (TOPROL-XL) 25 MG 24 hr tablet TAKE ONE-HALF (1/2) TABLET TWICE A DAY   rivaroxaban (XARELTO) 20 MG TABS tablet TAKE 1 TABLET DAILY WITH SUPPER   rosuvastatin (CRESTOR) 40 MG tablet TAKE 1 TABLET DAILY   [DISCONTINUED] methylPREDNISolone (MEDROL DOSEPAK) 4 MG TBPK tablet Take as directed    Allergies: Patient is allergic to bee venom. Family History: Patient family history is not on file. Social History:  Patient  reports that she quit smoking about 12 years ago. Her smoking use included cigarettes. She has never used smokeless tobacco. She reports that she does not drink alcohol and does not use drugs.  Review of Systems: Constitutional: Negative for fever malaise or anorexia Cardiovascular: negative for chest pain Respiratory: negative for SOB or persistent cough Gastrointestinal: negative for abdominal pain  Objective  Vitals: BP (!) 148/82   Pulse 68   Temp 98.1 F (36.7 C) (Temporal)   Wt 122 lb 12.8 oz (55.7 kg)   SpO2 97%   BMI 19.82 kg/m  General: Nontoxic-appearing, alert and oriented x3 HEENT: PEERL, conjunctiva normal, neck is supple, TMs clear bilaterally, some cerumen on left Cardiovascular:  RRR without murmur or gallop.  Respiratory:  Good breath sounds bilaterally, CTAB with normal respiratory effort Skin:  Warm, no rashes Neuro: Positive right gaze nystagmus, negative head impulse test, cranial nerves II through XII intact.  Cerebellar testing: Slight dysmetria with finger-to-nose.  Normal heel-to-shin.  Gait is wide-based and ataxic.  Romberg is positive.    Commons side effects, risks, benefits, and alternatives for medications and treatment plan prescribed today were discussed, and the patient expressed understanding of the given instructions. Patient is instructed to call or message via MyChart if he/she has any questions or concerns regarding our treatment plan. No barriers to understanding were identified. We  discussed Red Flag symptoms and signs in detail. Patient expressed understanding regarding what to do in case of urgent or emergency type symptoms.  Medication list was reconciled, printed and provided to the patient in AVS. Patient instructions and summary information was reviewed with the patient as documented in the AVS. This note was prepared with assistance of Dragon voice recognition software. Occasional wrong-word or sound-a-like substitutions may have occurred due to the inherent limitations of voice recognition software  This visit occurred during the SARS-CoV-2 public health emergency.  Safety protocols were in place, including screening questions prior to the visit, additional usage of staff PPE, and extensive cleaning of exam room while observing appropriate contact time as indicated for disinfecting solutions.

## 2020-09-19 NOTE — Patient Instructions (Signed)
Please return in 1 week for recheck  Go to ER for any worsening neurologic symptoms as we discussed.  [facility]. Start it tomorrow. Use the meclizine in the meantime.   I've ordered a brain MRI to ensure there is no evidence of a central cause for your extreme dizziness.  If you have any questions or concerns, please don't hesitate to send me a message via MyChart or call the office at [phone]. Thank you for visiting with [place] today! It's our pleasure caring for you.   Dizziness Dizziness is a common problem. It is a feeling of unsteadiness or light-headedness. You may feel like you are about to faint. Dizziness can lead to injury if you stumble or fall. Anyone can become dizzy, but dizziness is more common in older adults. This condition can be caused by a number ofthings, including medicines, dehydration, or illness. Follow these instructions at home: Eating and drinking  Drink enough fluid to keep your urine pale yellow. This helps to keep you from becoming dehydrated. Try to drink more clear fluids, such as water. Do not drink alcohol. Limit your caffeine intake if told to do so by your health care provider. Check ingredients and nutrition facts to see if a food or beverage contains caffeine. Limit your salt (sodium) intake if told to do so by your health care provider. Check ingredients and nutrition facts to see if a food or beverage contains sodium.  Activity  Avoid making quick movements. Rise slowly from chairs and steady yourself until you feel okay. In the morning, first sit up on the side of the bed. When you feel okay, stand slowly while you hold onto something until you know that your balance is good. If you need to stand in one place for a long time, move your legs often. Tighten and relax the muscles in your legs while you are standing. Do not drive or use machinery if you feel dizzy. Avoid bending down if you feel dizzy. Place  items in your home so that they are easy for you to reach without leaning over.  Lifestyle Do not use any products that contain nicotine or tobacco. These products include cigarettes, chewing tobacco, and vaping devices, such as e-cigarettes. If you need help quitting, ask your health care provider. Try to reduce your stress level by using methods such as yoga or meditation. Talk with your health care provider if you need help to manage your stress. General instructions Watch your dizziness for any changes. Take over-the-counter and prescription medicines only as told by your health care provider. Talk with your health care provider if you think that your dizziness is caused by a medicine that you are taking. Tell a friend or a family member that you are feeling dizzy. If he or she notices any changes in your behavior, have this person call your health care provider. Keep all follow-up visits. This is important. Contact a health care provider if: Your dizziness does not go away or you have new symptoms. Your dizziness or light-headedness gets worse. You feel nauseous. You have reduced hearing. You have a fever. You have neck pain or a stiff neck. Your dizziness leads to an injury or a fall. Get help right away if: You vomit or have diarrhea and are unable to eat or drink anything. You have problems talking, walking, swallowing, or using your arms, hands, or legs. You feel generally weak. You have any bleeding. You are not thinking clearly  or you have trouble forming sentences. It may take a friend or family member to notice this. You have chest pain, abdominal pain, shortness of breath, or sweating. Your vision changes or you develop a severe headache. These symptoms may represent a serious problem that is an emergency. Do not wait to see if the symptoms will go away. Get medical help right away. Call your local emergency services (911 in the [place] Do not drive yourself to the  hospital. Summary Dizziness is a feeling of unsteadiness or light-headedness. This condition can be caused by a number of things, including medicines, dehydration, or illness. Anyone can become dizzy, but dizziness is more common in older adults. Drink enough fluid to keep your urine pale yellow. Do not drink alcohol. Avoid making quick movements if you feel dizzy. Monitor your dizziness for any changes. This information is not intended to replace advice given to you by your health care provider. Make sure you discuss any questions you have with your healthcare provider. Document Revised: 01/16/2020 Document Reviewed: 01/16/2020 Elsevier Patient Education  2022 [employer].  Labyrinthitis  Labyrinthitis is an inner ear infection. The inner ear is a system of tubes and canals (labyrinth) that are filled with fluid. The inner ear also contains nerve cells that send hearing and balance signals to the brain. When tiny germs (microorganisms) get inside the labyrinth, they harm the cells that send messages to thebrain. This can cause changes in hearing and balance. Labyrinthitis usually develops suddenly and goes away with treatment in a few weeks (acute labyrinthitis). If the infection damages parts of the labyrinth, some symptoms may last for a long time (chronic labyrinthitis). What are the causes? Labyrinthitis can be caused by viruses, such as one that causes: Infectious mononucleosis, also called mono. Measles or mumps. The flu (influenza). Herpes. Labyrinthitis can also be caused by bacteria that spread from an infection in the brain or the middle ear (suppurative labyrinthitis). In some cases, the bacteria may produce a poison (toxin) that gets inside the labyrinth (serous labyrinthitis). What increases the risk? You may be at greater risk for labyrinthitis if you: Recently had a mouth, nose, or throat infection (upper respiratory infection) or an ear infection. Drink a lot of  alcohol. Smoke. Use certain drugs. Are feeling tired (fatigued). Are experiencing a lot of stress. Have allergies. What are the signs or symptoms? Symptoms of labyrinthitis usually start suddenly. The symptoms may range from mild to severe, and may include: Dizziness. Hearing loss. A feeling that you or your surroundings are moving when they are not (vertigo). Ringing in your ear (tinnitus). Nausea and vomiting. Trouble focusing your eyes. Symptoms of chronic labyrinthitis may include: Fatigue. Confusion. Hearing loss. Tinnitus. Poor balance. Vertigo after sudden head movements. How is this diagnosed? This condition may be diagnosed based on: Your symptoms and medical history. Your health care provider may ask about any dizziness or hearing loss you have and any recent upper respiratory infections. A physical exam that involves: Checking your ears for infection. Testing your balance. Checking your eye movement. Hearing tests. Imaging tests, such as a CT scan or an MRI. Tests of your eye movements (electronystagmogram, or ENG). How is this treated? Treatment depends on the cause. If your condition is caused by bacteria, you may need antibiotic medicine. If it is caused by a virus, it may get better onits own. Regardless of the cause, you may be treated with: Medicines to: Stop dizziness. Relieve nausea. Reduce inflammation. Speed your recovery. IV fluids. These  may be given at a hospital. You may need IV fluids if you have severe nausea and vomiting. Physical therapy. A therapist can teach you exercises to help you adjust to feeling dizzy (vestibular rehabilitation exercises). You may need this if you have dizziness that does not go away. Follow these instructions at home: Medicines Take over-the-counter and prescription medicines only as told by your health care provider. If you were prescribed an antibiotic medicine, take it as told by your health care provider. Do not  stop taking the antibiotic even if you start to feel better. Activity Rest as told by your health care provider. Limit your activity as directed. Ask your health care provider what activities are safe for you. Do not make sudden movements until any dizziness goes away. If physical therapy was prescribed, do exercises as directed. General instructions Avoid loud noises and bright lights. Do not drive until your health care provider says that this is safe for you. Drink enough fluid to keep your urine pale yellow. Keep all follow-up visits. This is important. Contact a health care provider if you have: Symptoms that do not get better with medicine. Symptoms that last longer than 2 weeks. A fever. Get help right away if you have: Nausea or vomiting that is severe or does not go away. Severe dizziness. Sudden hearing loss. Summary Labyrinthitis is an infection of the inner ear. It can cause changes in hearing and balance or vertigo. Symptoms usually start suddenly and include dizziness, hearing loss, nausea, and vomiting. You may also have ringing in your ear (tinnitus), trouble focusing your eyes, and vertigo. If the condition lasts more than a few weeks, symptoms may include fatigue, confusion, hearing loss, poor balance, tinnitus, and vertigo. Treatment depends on the cause. If your labyrinthitis is caused by bacteria, you may need antibiotic medicine. If your labyrinthitis is caused by a virus, it may get better on its own. Follow your health care provider's instructions, including how to take medicines, what activities to avoid, and when to get medical help. This information is not intended to replace advice given to you by your health care provider. Make sure you discuss any questions you have with your healthcare provider. Document Revised: 03/21/2020 Document Reviewed: 03/21/2020 Elsevier Patient Education  2022 [employer].

## 2020-09-20 ENCOUNTER — Encounter: Payer: Self-pay | Admitting: Family Medicine

## 2020-09-26 ENCOUNTER — Telehealth: Payer: Self-pay

## 2020-09-26 NOTE — Telephone Encounter (Signed)
Error

## 2020-09-27 ENCOUNTER — Other Ambulatory Visit: Payer: Self-pay

## 2020-09-27 ENCOUNTER — Ambulatory Visit (INDEPENDENT_AMBULATORY_CARE_PROVIDER_SITE_OTHER): Payer: Medicare Other | Admitting: Family Medicine

## 2020-09-27 ENCOUNTER — Encounter: Payer: Self-pay | Admitting: Family Medicine

## 2020-09-27 VITALS — BP 140/58 | HR 98 | Temp 98.1°F | Ht 66.0 in | Wt 122.4 lb

## 2020-09-27 DIAGNOSIS — F5104 Psychophysiologic insomnia: Secondary | ICD-10-CM

## 2020-09-27 DIAGNOSIS — H8121 Vestibular neuronitis, right ear: Secondary | ICD-10-CM | POA: Diagnosis not present

## 2020-09-27 DIAGNOSIS — I480 Paroxysmal atrial fibrillation: Secondary | ICD-10-CM

## 2020-09-27 DIAGNOSIS — Z7901 Long term (current) use of anticoagulants: Secondary | ICD-10-CM

## 2020-09-27 MED ORDER — LORAZEPAM 0.5 MG PO TABS: 0.5000 mg | ORAL_TABLET | Freq: Every evening | ORAL | 0 refills | Status: DC | PRN

## 2020-09-27 NOTE — Patient Instructions (Signed)
Please follow up as scheduled for your next visit with me: 11/21/2020   We will get you set up for vestibular rehab.  We will call you with an appointment.   If you have any questions or concerns, please don't hesitate to send me a message via MyChart or call the office at [phone]. Thank you for visiting with [place] today! It's our pleasure caring for you.

## 2020-09-27 NOTE — Progress Notes (Signed)
Subjective  CC:  Chief Complaint  Patient presents with   Dizziness    1 week f/u    HPI: Carly Myers is a 75 y.o. female who presents to the office today to address the problems listed above in the chief complaint. See last note.  75 year old with ataxia and vertigo, treated for acute labyrinthitis with Medrol Dosepak.  She is here for follow-up and fortunately is improving.  She feels much better than last week.  No further major episodes of vertigo or ataxia however she still feels slightly off.  Balance is not perfect.  Mild dizziness intermittently.  No headaches, diplopia, dysarthria or paresis.  She does have her brain MRI scheduled for August 25.  She is completing her Medrol Dosepak today.  She has many questions.  She denies chest pain, palpitations, headaches, neck stiffness or fever. PAF on long-term anticoagulants: No palpitations or chest pain.  No bleeding. Requests refill of her Ativan: She takes this for sleep intermittently.  She takes one quarter of a pill.  Assessment  1. Vestibular neuritis, right   2. PAF (paroxysmal atrial fibrillation) ([place])   3. Long term (current) use of anticoagulants   4. Insomnia, psychophysiological      Plan  Vertigo with most likely diagnosis vestibular neuritis/labyrinthitis: Improving.  To complete steroid today.  Refer for vestibular rehab.  Check brain MRI as scheduled to rule out central etiologies.  Fall precaution again discussed. PAF: Blood pressure and heart rate rechecked by me and both are near normal. Insomnia: Managed by intermittent low-dose Ativan.  Last refill was in November.   Follow up: As scheduled 11/21/2020  Orders Placed This Encounter  Procedures   PT vestibular rehab   Meds ordered this encounter  Medications   LORazepam (ATIVAN) 0.5 MG tablet    Sig: Take 1 tablet (0.5 mg total) by mouth at bedtime as needed for sleep.    Dispense:  30 tablet    Refill:  0      I reviewed the patients updated PMH,  FH, and SocHx.    Patient Active Problem List   Diagnosis Date Noted   Essential hypertension 12/28/2019   PAF (paroxysmal atrial fibrillation) ([place]) 12/28/2019   Long term (current) use of anticoagulants 12/28/2019   Mixed hyperlipidemia 03/25/2017   Anxiety state 03/25/2017   Vitamin deficiency 03/25/2017   Osteopenia 03/25/2017   Major depression 03/25/2017   Senile purpura ([place]) 03/25/2017   Allergic rhinitis 03/25/2017   No outpatient medications have been marked as taking for the 09/27/20 encounter (Office Visit) with Leamon Arnt, MD.    Allergies: Patient is allergic to bee venom. Family History: Patient family history is not on file. Social History:  Patient  reports that she quit smoking about 12 years ago. Her smoking use included cigarettes. She has never used smokeless tobacco. She reports that she does not drink alcohol and does not use drugs.  Review of Systems: Constitutional: Negative for fever malaise or anorexia Cardiovascular: negative for chest pain Respiratory: negative for SOB or persistent cough Gastrointestinal: negative for abdominal pain  Objective  Vitals: BP (!) 140/58   Pulse 98 Comment: irreg irreg  Temp 98.1 F (36.7 C)   Ht '5\' 6"'$  (1.676 m)   Wt 122 lb 6.1 oz (55.5 kg)   SpO2 99%   BMI 19.75 kg/m  General: no acute distress , A&Ox3, looks much better HEENT: PEERL, conjunctiva normal, neck is supple Cardiovascular:  RRR without murmur or gallop.  Respiratory:  Good breath sounds bilaterally, CTAB with normal respiratory effort Skin:  Warm, no rashes Neuro: Mild right gaze nystagmus persist, minimal vertigo with head movement.  Cranial nerves II through XII intact, minimal ataxia with finger-to-nose and rapid alternating movement, gait is much more stable.  No longer wide-based.    Commons side effects, risks, benefits, and alternatives for medications and treatment plan prescribed today were discussed, and the patient expressed  understanding of the given instructions. Patient is instructed to call or message via MyChart if he/she has any questions or concerns regarding our treatment plan. No barriers to understanding were identified. We discussed Red Flag symptoms and signs in detail. Patient expressed understanding regarding what to do in case of urgent or emergency type symptoms.  Medication list was reconciled, printed and provided to the patient in AVS. Patient instructions and summary information was reviewed with the patient as documented in the AVS. This note was prepared with assistance of Dragon voice recognition software. Occasional wrong-word or sound-a-like substitutions may have occurred due to the inherent limitations of voice recognition software  This visit occurred during the SARS-CoV-2 public health emergency.  Safety protocols were in place, including screening questions prior to the visit, additional usage of staff PPE, and extensive cleaning of exam room while observing appropriate contact time as indicated for disinfecting solutions.

## 2020-10-16 ENCOUNTER — Other Ambulatory Visit: Payer: Self-pay

## 2020-10-16 ENCOUNTER — Telehealth: Payer: Self-pay

## 2020-10-16 DIAGNOSIS — H8121 Vestibular neuronitis, right ear: Secondary | ICD-10-CM

## 2020-10-16 NOTE — Telephone Encounter (Signed)
Active order for patient. Can you look into this?

## 2020-10-16 NOTE — Telephone Encounter (Signed)
Patient was viewing AVS from last visit 8/4 and it states  Orders Placed This Encounter  Procedures   PT vestibular rehab   And she states shes never heard anything regarding this please advise

## 2020-10-17 DIAGNOSIS — H26491 Other secondary cataract, right eye: Secondary | ICD-10-CM | POA: Diagnosis not present

## 2020-10-18 ENCOUNTER — Ambulatory Visit
Admission: RE | Admit: 2020-10-18 | Discharge: 2020-10-18 | Disposition: A | Discharge status: Home / Self Care | Payer: Medicare Other | Source: Ambulatory Visit | Attending: Family Medicine | Admitting: Family Medicine

## 2020-10-18 DIAGNOSIS — R270 Ataxia, unspecified: Secondary | ICD-10-CM

## 2020-10-18 DIAGNOSIS — R42 Dizziness and giddiness: Secondary | ICD-10-CM | POA: Diagnosis not present

## 2020-10-18 DIAGNOSIS — I480 Paroxysmal atrial fibrillation: Secondary | ICD-10-CM | POA: Diagnosis not present

## 2020-10-18 DIAGNOSIS — R278 Other lack of coordination: Secondary | ICD-10-CM | POA: Diagnosis not present

## 2020-10-18 DIAGNOSIS — Z7901 Long term (current) use of anticoagulants: Secondary | ICD-10-CM

## 2020-10-18 DIAGNOSIS — H5500 Unspecified nystagmus: Secondary | ICD-10-CM | POA: Diagnosis not present

## 2020-10-18 MED ORDER — GADOBENATE DIMEGLUMINE 529 MG/ML IV SOLN
11.0000 mL | Freq: Once | INTRAVENOUS | Status: AC | PRN
  Administered 2020-10-18: 11 mL via INTRAVENOUS

## 2020-10-22 ENCOUNTER — Telehealth: Payer: Self-pay | Admitting: Internal Medicine

## 2020-10-22 ENCOUNTER — Telehealth: Payer: Self-pay

## 2020-10-22 NOTE — Telephone Encounter (Signed)
Left message to call back  

## 2020-10-22 NOTE — Telephone Encounter (Signed)
Patient read results from MRI, and would like a referral to neurologist.

## 2020-10-22 NOTE — Telephone Encounter (Signed)
Patient c/o Palpitations:  High priority if patient c/o lightheadedness, shortness of breath, or chest pain  How long have you had palpitations/irregular HR/ Afib? Are you having the symptoms now? One week/ yes   Are you currently experiencing lightheadedness, SOB or CP? no  Do you have a history of afib (atrial fibrillation) or irregular heart rhythm? yes  Have you checked your BP or HR? (document readings if available): no   Are you experiencing any other symptoms? Vertigo

## 2020-10-23 ENCOUNTER — Other Ambulatory Visit: Payer: Self-pay | Admitting: Physician Assistant

## 2020-10-23 DIAGNOSIS — R9089 Other abnormal findings on diagnostic imaging of central nervous system: Secondary | ICD-10-CM

## 2020-10-23 NOTE — Telephone Encounter (Signed)
Spoke to pt told her referral has been placed for Neurology and I wanted to make sure you were aware cause they sent a message that you were scheduled. Pt said yes she is aware. Told her okay.

## 2020-10-23 NOTE — Telephone Encounter (Signed)
Returned call to Pt.  Per Pt-about 5 weeks ago she started feeling drugged, like she couldn't walk.  She states she also threw up.  Was diagnosed with vertigo.  She saw her PCP and had an MRI ordered.  Pt is worried about MRI results.  Unable to discuss with PCP who is unavailable.  Possibly because of stress related to vertigo/MRI results Pt has had breakthrough afib.    Pt previously would take an extra half tablet of metoprolol as needed which would resolve afib.  It has not been working.   Pt states blood pressure has been higher than normal also.  Pt requesting-referral to neurologist.  Recommendation to treat afib.

## 2020-10-23 NOTE — Telephone Encounter (Signed)
Pt is returning call from 10/22/20. Please call pt's cell phone number [phone]

## 2020-10-23 NOTE — Telephone Encounter (Signed)
Carly Myers okay to seen referral to Neurology?

## 2020-10-24 ENCOUNTER — Ambulatory Visit (HOSPITAL_COMMUNITY): Payer: Medicare Other | Admitting: Nurse Practitioner

## 2020-10-24 NOTE — Telephone Encounter (Signed)
I am sorry she is not feeling well Review of MRI--  no acute findings.  Some small vessel (microvascular ) disease   Keep control of BP Remote stroke (small)   Keep on Xarelto  Refer to neurology [facility])

## 2020-10-24 NOTE — Telephone Encounter (Signed)
Pt has appointment in neuro already.  No referral placed.  Her BP has been 130s-140s / 60s-70s.  She takes Toprol XL 12.5 mg BID.  Yesterday, while she has been dizzy anyway, she went out and push mowed her yard in the sun.  This made the afib worse.  Her HR is WNL but she feels the irregularity.  Dizziness continues. HR is 60s-80s. She states Dr. Harrington Challenger told her to take an extra 1/2 Toprol XL f needed.  She did this yesterday afternoon.  I adv she can take an extra Toprol XL 12.5 mg now, and take whole tab this evening and while she feeling this afib episode she should continue to take 25 mg BID.  Since she is experiencing dizziness, adv to monitor BP and only take the 25 mg as long as SBP >110.  Pt in agreement and grateful for this plan.  She will stay hydrated and stay out of the sun on the hot days and limit outside work.  Will route to Dr. Harrington Challenger for review.  Pt aware that we will call her back if any other recommendations.

## 2020-10-25 NOTE — Telephone Encounter (Signed)
Reviewed    Noted that pt moving yard.   I would avoid outdoor work in heat    Probably exacerbating dizziness Stay in cooler areas, limit time in sun/excess heat Stay hydrated

## 2020-10-26 ENCOUNTER — Ambulatory Visit (INDEPENDENT_AMBULATORY_CARE_PROVIDER_SITE_OTHER): Payer: Medicare Other | Admitting: Neurology

## 2020-10-26 ENCOUNTER — Encounter: Payer: Self-pay | Admitting: Neurology

## 2020-10-26 VITALS — BP 145/73 | HR 56 | Ht 66.0 in | Wt 122.0 lb

## 2020-10-26 DIAGNOSIS — R42 Dizziness and giddiness: Secondary | ICD-10-CM | POA: Diagnosis not present

## 2020-10-26 DIAGNOSIS — R269 Unspecified abnormalities of gait and mobility: Secondary | ICD-10-CM | POA: Diagnosis not present

## 2020-10-26 DIAGNOSIS — I6389 Other cerebral infarction: Secondary | ICD-10-CM | POA: Diagnosis not present

## 2020-10-26 DIAGNOSIS — I679 Cerebrovascular disease, unspecified: Secondary | ICD-10-CM | POA: Diagnosis not present

## 2020-10-26 DIAGNOSIS — H81399 Other peripheral vertigo, unspecified ear: Secondary | ICD-10-CM | POA: Insufficient documentation

## 2020-10-26 NOTE — Progress Notes (Signed)
ASSESSMENT AND PLAN  Carly Myers is a 75 y.o. 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 MRI brain wo October 18 2020 1. No evidence of acute intracranial abnormality. 2. 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   Lab in March 2022: LDL 70, CMP,CBC, Hg 13.1  ECHO in 2019: Left ventricle: The cavity size was normal. Systolic function was normal. The estimated ejection fraction was in the range of 60% to 65%. Wall motion was normal; there were no regional wall motion abnormalities.  US carotid in 2020: less than 40% stenosis of bilateral internal carotid artery.  MEDICAL HISTORY:  Carly Myers is a 75 years old female, seen in request by primary care physician  PA Inda Coke, for evaluation of   I reviewed and summarized the referring note. PMHX. HLD HTN Atrial Fibrillation Shingles Depression  At the end of July 2022, she woke up 1 morning had severe vertigo, unsteady gait, she took Dramamine, slept for 2 days, secondary her symptoms was not better, was seen by primary care physician, was diagnosed with vertigo, given meclizine, which also make her drowsy for few days  A week later, her symptoms has much  improved, but with sudden movement, she still felt unsteady, she denies hearing loss, denies lateralized motor deficit,  I personally reviewed MRI of the brain with without contrast October 18, 2020, no acute intracranial abnormality, evidence of small small with mild infarction in the paramedian left cerebellar  She also complains of anxiety, primary care physician has ordered physical therapy  PHYSICAL EXAM:   There were no vitals filed for this visit. Not recorded     There is no height or weight on file to calculate BMI.  PHYSICAL EXAMNIATION:  Gen: NAD, conversant, well nourised, well groomed                     Cardiovascular: Regular rate rhythm, no peripheral edema, warm, nontender. Eyes: Conjunctivae clear without exudates or hemorrhage Neck: Supple, no carotid bruits. Pulmonary: Clear to auscultation bilaterally   NEUROLOGICAL EXAM:  MENTAL STATUS: Speech:    Speech is normal; fluent and spontaneous with normal comprehension.  Cognition:     Orientation to time, place and person     Normal recent and remote memory     Normal Attention span and concentration     Normal Language, naming, repeating,spontaneous speech     Fund of knowledge   CRANIAL NERVES: CN II: Visual fields are full to confrontation. Pupils are round equal and briskly reactive to light. CN III, IV, VI: extraocular movement are normal. No ptosis. CN V: Facial sensation is intact to light touch CN VII: Face is symmetric with normal eye closure  CN VIII: Hearing is normal to causal conversation. CN IX, X: Phonation is normal. CN XI: Head  turning and shoulder shrug are intact  MOTOR: There is no pronator drift of out-stretched arms. Muscle bulk and tone are normal. Muscle strength is normal.  REFLEXES: Reflexes are 1 and symmetric at the biceps, triceps, knees, and ankles. Plantar responses are flexor.  SENSORY: Intact to light touch, pinprick and vibratory sensation are intact in fingers and  toes.  COORDINATION: There is no trunk or limb dysmetria noted.  GAIT/STANCE: He needs push-up to get up from seated position, mildly unsteady, mild difficulty perform tandem walking    REVIEW OF SYSTEMS:  Full 14 system review of systems performed and notable only for as above All other review of systems were negative.   ALLERGIES: Allergies  Allergen Reactions   Bee Venom     HOME MEDICATIONS: Current Outpatient Medications  Medication Sig Dispense Refill   Calcium Carb-Cholecalciferol (CALCIUM + D3) 600-200 MG-UNIT TABS Take 1 tablet by mouth daily.     Cholecalciferol (VITAMIN D) 2000 units CAPS Take 2,000 Units by mouth daily.     EPINEPHrine 0.3 mg/0.3 mL IJ SOAJ injection Inject 0.3 mg into the muscle once.     Krill Oil 1000 MG CAPS Take 1 capsule by mouth daily.     LORazepam (ATIVAN) 0.5 MG tablet Take 1 tablet (0.5 mg total) by mouth at bedtime as needed for sleep. 30 tablet 0   Magnesium Hydroxide (MAGNESIA PO) Take 1 tablet by mouth daily.     methylPREDNISolone (MEDROL DOSEPAK) 4 MG TBPK tablet Take as directed 21 each 0   metoprolol succinate (TOPROL-XL) 25 MG 24 hr tablet TAKE ONE-HALF (1/2) TABLET TWICE A DAY 90 tablet 2   rivaroxaban (XARELTO) 20 MG TABS tablet TAKE 1 TABLET DAILY WITH SUPPER 90 tablet 3   rosuvastatin (CRESTOR) 40 MG tablet TAKE 1 TABLET DAILY 90 tablet 2   No current facility-administered medications for this visit.    PAST MEDICAL HISTORY: Past Medical History:  Diagnosis Date   Allergic rhinitis    Anaphylactic reaction to bee sting    Anxiety state    unspecified   Arthritis    Ortho Dr. Berenice Primas   Disorder of bone    unspecified   Essential hypertension 12/28/2019   Hypercholesteremia    migrated   Hyperlipemia    Long term (current) use of anticoagulants 12/28/2019   PAF   Low back pain    Major depression    in complete remission   Osteopenia    Osteoporosis    PAF (paroxysmal atrial fibrillation) ([place]) 12/28/2019    Post-menopausal    Senile purpura ([place])    Shingles    Sinusitis    UTI (urinary tract infection)    Vitamin D deficiency     PAST SURGICAL HISTORY: No past surgical history on file.  FAMILY HISTORY: No family history on file.  SOCIAL HISTORY: Social History   Socioeconomic History   Marital status: Widowed    Spouse name: Not on file   Number of children: 0   Years of education: Not on file   Highest education level: Not on file  Occupational History   Not on file  Tobacco Use   Smoking status: Former    Types: Cigarettes    Quit date: 02/25/2008    Years since quitting: 12.6   Smokeless tobacco: Never  Vaping Use   Vaping Use: Never used  Substance and Sexual Activity   Alcohol use: Never   Drug use: Never   Sexual activity: Not Currently  Birth control/protection: Post-menopausal  Other Topics Concern   Not on file  Social History Narrative   Not on file   Social Determinants of Health   Financial Resource Strain: Not on file  Food Insecurity: Not on file  Transportation Needs: Not on file  Physical Activity: Not on file  Stress: Not on file  Social Connections: Not on file  Intimate Partner Violence: Not on file      Marcial Pacas, M.D. Ph.D.  [facility] [address] [place], [place] [address] Ph: [phone] Fax: [phone]  CC:  Inda Coke, [place],  [place] [address]  Leamon Arnt, MD

## 2020-10-26 NOTE — Telephone Encounter (Signed)
Patient is scheduled for 9/6

## 2020-10-29 NOTE — Progress Notes (Signed)
No acute findings. Seeing neuro for ongoing vertigo

## 2020-10-30 ENCOUNTER — Ambulatory Visit: Payer: Medicare Other | Attending: Family Medicine

## 2020-10-30 ENCOUNTER — Other Ambulatory Visit: Payer: Self-pay

## 2020-10-30 DIAGNOSIS — R42 Dizziness and giddiness: Secondary | ICD-10-CM | POA: Insufficient documentation

## 2020-10-30 DIAGNOSIS — R2689 Other abnormalities of gait and mobility: Secondary | ICD-10-CM | POA: Insufficient documentation

## 2020-10-30 DIAGNOSIS — R2681 Unsteadiness on feet: Secondary | ICD-10-CM | POA: Diagnosis not present

## 2020-10-30 NOTE — Therapy (Signed)
[facility] [address] [place], [place], [address] Phone: [phone]   Fax:  [phone]  Physical Therapy Evaluation  Patient Details  Name: Carly Myers MRN: [id number] Date of Birth: [birth year] Referring Provider (PT): Billey Chang, MD   Encounter Date: 10/30/2020   PT End of Session - 10/30/20 1536     Visit Number 1    Number of Visits 13    Date for PT Re-Evaluation 12/14/20    Authorization Type Medicare A + B; BCBS Supplemental    Progress Note Due on Visit 10    PT Start Time [id number]    PT Stop Time 1610    PT Time Calculation (min) 40 min    Activity Tolerance Patient tolerated treatment well    Behavior During Therapy [facility] for tasks assessed/performed             Past Medical History:  Diagnosis Date   Allergic rhinitis    Anaphylactic reaction to bee sting    Anxiety state    unspecified   Arthritis    Ortho Dr. Berenice Primas   Disorder of bone    unspecified   Essential hypertension 12/28/2019   Hypercholesteremia    migrated   Hyperlipemia    Long term (current) use of anticoagulants 12/28/2019   PAF   Low back pain    Major depression    in complete remission   Osteopenia    Osteoporosis    PAF (paroxysmal atrial fibrillation) ([place]) 12/28/2019   Post-menopausal    Senile purpura ([place])    Shingles    Sinusitis    UTI (urinary tract infection)    Vitamin D deficiency     Past Surgical History:  Procedure Laterality Date   NO PAST SURGERIES      There were no vitals filed for this visit.    Subjective Assessment - 10/30/20 1533     Subjective Patient reports a couple weeks back woke up very dizzy and off balance. She took Meclizine for it. Reports she felt like she was drunk constantly. Patient reports a mild pressure in the forehead. Patient reports that sometimes when she rolls in bed feels the dizziness. Reports she feels the sensation with quick head/body movements. Patient reports  she initially had blurry vision, denies hearing changes. No falls, but having to be very careful. Has had mild HA that comes/goes.    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    Currently in Pain? No/denies                [facility] PT Assessment - 10/30/20 0001       Assessment   Medical Diagnosis Vestibular Neuritis    Referring Provider (PT) Billey Chang, MD    Onset Date/Surgical Date 09/24/20    Prior Therapy No Vestibular PT Prior      Precautions   Precautions Fall      Balance Screen   Has the patient fallen in the past 6 months No    Has the patient had a decrease in activity level because of a fear of falling?  No    Is the patient reluctant to leave their home because of a fear of falling?  No      Home Environment  Living Environment Private residence    Living Arrangements Alone    Available Help at Discharge Family;Friend(s)    Type of [place] Two level    Alternate Level Stairs-Number of Steps 15    Alternate Level Stairs-Rails Left    Home Equipment Walker - 2 wheels   used the RW when dizziness was acute   Additional Comments takes care of 11 dogs within her home      Prior Function   Level of Independence Independent    Vocation --   Judges Dog Competitions     Cognition   Overall Cognitive Status Within Functional Limits for tasks assessed      Observation/Other Assessments   Focus on Therapeutic Outcomes (FOTO)  DPS: 52% DFS: 49.2%      Sensation   Light Touch Appears Intact      Coordination   Gross Motor Movements are Fluid and Coordinated Yes      Posture/Postural Control   Posture/Postural Control Postural limitations    Postural Limitations Rounded Shoulders;Forward head      ROM /  Strength   AROM / PROM / Strength Strength      Strength   Overall Strength Within functional limits for tasks performed      Transfers   Transfers Sit to Stand;Stand to Sit    Sit to Stand 6: Modified independent (Device/Increase time)    Stand to Sit 6: Modified independent (Device/Increase time)      Ambulation/Gait   Ambulation/Gait Yes    Ambulation/Gait Assistance 5: Supervision    Ambulation/Gait Assistance Details mild unsteadiness with ambulation after DVA    Ambulation Distance (Feet) 50 Feet    Assistive device None    Gait Pattern Wide base of support    Ambulation Surface Level;Indoor      High Level Balance   High Level Balance Comments Completed M-CTSIB: situation 1 and 2 for full 30 seconds. situation 3: 20 seconds. situation 4: < 5 seconds.                    Vestibular Assessment - 10/30/20 0001       Symptom Behavior   Subjective history of current problem see subjective    Type of Dizziness  Imbalance;Blurred vision;Unsteady with head/body turns;"Funny feeling in head"    Frequency of Dizziness daily    Duration of Dizziness for the duration of movement    Symptom Nature Motion provoked;Intermittent    Aggravating Factors Turning body quickly;Turning head quickly;Looking up to the ceiling    Relieving Factors Slow movements    Progression of Symptoms Better    History of similar episodes None      Oculomotor Exam   Oculomotor Alignment Normal    Ocular ROM WNL    Spontaneous Absent    Gaze-induced  Absent    Smooth Pursuits Intact    Saccades Intact      Oculomotor Exam-Fixation Suppressed    Left Head Impulse Positive    Right Head Impulse Negative      Vestibulo-Ocular Reflex   VOR 1 Head Only (x 1 viewing) difficulty maintaining focus; mild dizziness    VOR Cancellation Normal      Visual Acuity   Static 9    Dynamic 6   mild dizziness     Positional Testing   Dix-Hallpike Dix-Hallpike Right;Dix-Hallpike Left       Dix-Hallpike Right   Dix-Hallpike Right Duration 0  Dix-Hallpike Right Symptoms No nystagmus      Dix-Hallpike Left   Dix-Hallpike Left Duration 0    Dix-Hallpike Left Symptoms No nystagmus      Positional Sensitivities   Sit to Supine Mild dizziness    Supine to Left Side Lightheadedness    Supine to Right Side No dizziness    Supine to Sitting Lightheadedness    Right Hallpike No dizziness    Up from Right Hallpike No dizziness    Up from Left Hallpike No dizziness    Nose to Right Knee No dizziness    Right Knee to Sitting Lightedness    Nose to Left Knee No dizziness    Left Knee to Sitting Lightheadedness    Head Turning x 5 Mild dizziness    Head Nodding x 5 Mild dizziness    Pivot Right in Standing No dizziness    Pivot Left in Standing Mild dizziness    Rolling Right Lightheadedness    Rolling Left Lightheadedness             Objective measurements completed on examination: See above findings.     PT Education - 10/30/20 1540     Education Details Educated on [employer]) Educated Patient    Methods Explanation    Comprehension Verbalized understanding              PT Short Term Goals - 10/30/20 1613       PT SHORT TERM GOAL #1   Title Patient will be independent with initial HEP for balance/vestibular (ALL STGs Due: 11/23/20)    Baseline no HEP established    Time 3    Period Weeks    Status New    Target Date 11/23/20      PT SHORT TERM GOAL #2   Title Patient will be able to hold situation 3 of M-CTSIB for >/= 30 seconds    Baseline 20 seconds    Time 3    Period Weeks    Status New      PT SHORT TERM GOAL #3   Title FGA to be assessed and LTG to be set    Baseline TBA    Time 3    Period Weeks    Status New               PT Long Term Goals - 10/30/20 1614       PT LONG TERM GOAL #1   Title Patient will be independent with final vestibular/balance HEP (All LTGs Due: 12/14/20)    Baseline no HEP  established    Time 6    Period Weeks    Status New    Target Date 12/14/20      PT LONG TERM GOAL #2   Title Patient will demo </= 2 line difference on DVA to indicative improved VOR function    Baseline 3 line difference    Time 6    Period Weeks    Status New      PT LONG TERM GOAL #3   Title LTG to be set for FGA    Baseline TBA    Time 6    Period Weeks    Status New      PT LONG TERM GOAL #4   Title Patient will report </= 1/5 on all components of MSQ to demo improved tolerance for functional movement    Baseline see flowsheet    Time 6  Period Weeks    Status New      PT LONG TERM GOAL #5   Title Patient will be able to hold situation 4 of M-CTSIB for >/= 20 seconds    Baseline <5 seconds    Time 6    Period Weeks    Status New      Additional Long Term Goals   Additional Long Term Goals Yes      PT LONG TERM GOAL #6   Title Patient will improve FOTO (DPS) to >/= 60%    Baseline 52%    Time 6    Period Weeks    Status New                    Plan - 10/30/20 1610     Clinical Impression Statement Patient is a 75 y.o. female referred to Neuro OPPT services for Vestibular Neuritis. PMH significant for the following: Arthritis, HLD, LBP, Depression, Osteoporosis, PAF, HTN. Patient presents with the following impairments upon evaluation: positive L HIT test and abnormal DVA indicating impaired VOR, increased motion senstivity, and decreased balance with score on M-CTSIB leading to patient at increased risk for falls. Patient will benefit from further balance assesment with FGA at next session. Patient will benefit from skilled PT services to address impairments and reduce vertiginous symptoms and demo reduced fall risk.    Personal Factors and Comorbidities Comorbidity 3+    Comorbidities Arthritis, HLD, LBP, Depression, Osteoporosis, PAF, HTN    Examination-Activity Limitations Bed Mobility;Bend;Locomotion Level;Stairs;Caring for Others     Examination-Participation Restrictions Cleaning;Community Activity;Yard Work    Stability/Clinical Decision Making Stable/Uncomplicated    Designer, jewellery Low    Rehab Potential Good    PT Frequency 2x / week    PT Duration 6 weeks    PT Treatment/Interventions ADLs/Self Care Home Management;Canalith Repostioning;Cryotherapy;Moist Heat;Gait training;DME Instruction;Functional mobility training;Stair training;Therapeutic exercise;Balance training;Therapeutic activities;Neuromuscular re-education;Patient/family education;Manual techniques;Dry needling;Passive range of motion;Vestibular;Joint Manipulations    PT Next Visit Plan Assess FGA and update goals. Initaite HEP focused on balance and VOR    Consulted and Agree with Plan of Care Patient             Patient will benefit from skilled therapeutic intervention in order to improve the following deficits and impairments:  Decreased balance, Decreased activity tolerance, Dizziness, Postural dysfunction, Difficulty walking  Visit Diagnosis: Unsteadiness on feet  Dizziness and giddiness  Other abnormalities of gait and mobility     Problem List Patient Active Problem List   Diagnosis Date Noted   Vertigo 10/26/2020   Gait abnormality 10/26/2020   Cerebral vascular disease 10/26/2020   Essential hypertension 12/28/2019   PAF (paroxysmal atrial fibrillation) ([place]) 12/28/2019   Long term (current) use of anticoagulants 12/28/2019   Mixed hyperlipidemia 03/25/2017   Anxiety state 03/25/2017   Vitamin deficiency 03/25/2017   Osteopenia 03/25/2017   Major depression 03/25/2017   Senile purpura ([place]) 03/25/2017   Allergic rhinitis 03/25/2017    Jones Bales, PT, DPT 10/30/2020, 6:47 PM  [facility] [address] [place], [place], [address] Phone: [phone]   Fax:  [phone]  Name: Carmela Mcelhenney MRN: [id number] Date of Birth: 21-Nov-1945

## 2020-11-02 ENCOUNTER — Telehealth: Payer: Self-pay | Admitting: Neurology

## 2020-11-02 NOTE — Telephone Encounter (Signed)
medicare/bcbs supp no auth require. Sent message to Butch Penny to reach out to the patient to schedule.

## 2020-11-06 ENCOUNTER — Other Ambulatory Visit: Payer: Self-pay

## 2020-11-06 ENCOUNTER — Ambulatory Visit: Payer: Medicare Other

## 2020-11-06 DIAGNOSIS — R2681 Unsteadiness on feet: Secondary | ICD-10-CM

## 2020-11-06 DIAGNOSIS — R2689 Other abnormalities of gait and mobility: Secondary | ICD-10-CM

## 2020-11-06 DIAGNOSIS — R42 Dizziness and giddiness: Secondary | ICD-10-CM

## 2020-11-06 NOTE — Therapy (Signed)
[facility] [address] [place], [place], [address] Phone: [phone]   Fax:  [phone]  Physical Therapy Treatment  Patient Details  Name: Carly Myers MRN: [id number] Date of Birth: 10-09-1945 Referring Provider (PT): Billey Chang, MD   Encounter Date: 11/06/2020   PT End of Session - 11/06/20 1104     Visit Number 2    Number of Visits 13    Date for PT Re-Evaluation 12/14/20    Authorization Type Medicare A + B; BCBS Supplemental    Progress Note Due on Visit 10    PT Start Time 1102    PT Stop Time 1142    PT Time Calculation (min) 40 min    Activity Tolerance Patient tolerated treatment well    Behavior During Therapy [facility] for tasks assessed/performed             Past Medical History:  Diagnosis Date   Allergic rhinitis    Anaphylactic reaction to bee sting    Anxiety state    unspecified   Arthritis    Ortho Dr. Berenice Primas   Disorder of bone    unspecified   Essential hypertension 12/28/2019   Hypercholesteremia    migrated   Hyperlipemia    Long term (current) use of anticoagulants 12/28/2019   PAF   Low back pain    Major depression    in complete remission   Osteopenia    Osteoporosis    PAF (paroxysmal atrial fibrillation) ([place]) 12/28/2019   Post-menopausal    Senile purpura ([place])    Shingles    Sinusitis    UTI (urinary tract infection)    Vitamin D deficiency     Past Surgical History:  Procedure Laterality Date   NO PAST SURGERIES      There were no vitals filed for this visit.   Subjective Assessment - 11/06/20 1105     Subjective Reports she has been feeling about the same, did have one good day. Reports she notices a correlation between how much sleep she gets and the dizziness.    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    Currently in Pain? No/denies                [facility] PT Assessment - 11/06/20 0001       Functional Gait  Assessment   Gait assessed  Yes    Gait Level Surface Walks 20 ft in less than 7 sec but greater than 5.5 sec, uses assistive device, slower speed, mild gait deviations, or deviates 6-10 in outside of the 12 in walkway width.    Change in Gait Speed Able to smoothly change walking speed without loss of balance or gait deviation. Deviate no more than 6 in outside of the 12 in walkway width.    Gait with Horizontal Head Turns Performs head turns smoothly with slight change in gait velocity (eg, minor disruption to smooth gait path), deviates 6-10 in outside 12 in walkway width, or uses an assistive device.    Gait with Vertical Head Turns Performs task with slight change in gait velocity (eg, minor disruption to smooth gait path), deviates 6 - 10 in outside 12 in walkway width or uses assistive device    Gait and Pivot  Turn Pivot turns safely in greater than 3 sec and stops with no loss of balance, or pivot turns safely within 3 sec and stops with mild imbalance, requires small steps to catch balance.    Step Over Obstacle Is able to step over 2 stacked shoe boxes taped together (9 in total height) without changing gait speed. No evidence of imbalance.    Gait with Narrow Base of Support Ambulates 4-7 steps.    Gait with Eyes Closed Walks 20 ft, slow speed, abnormal gait pattern, evidence for imbalance, deviates 10-15 in outside 12 in walkway width. Requires more than 9 sec to ambulate 20 ft.    Ambulating Backwards Walks 20 ft, uses assistive device, slower speed, mild gait deviations, deviates 6-10 in outside 12 in walkway width.    Steps Alternating feet, must use rail.    Total Score 20    FGA comment: 20/30              Vestibular Treatment/Exercise - 11/06/20 0001        Vestibular Treatment/Exercise   Vestibular Treatment Provided Gaze    Gaze Exercises X1 Viewing Horizontal;X1 Viewing Vertical      X1 Viewing Horizontal   Foot Position standing feet apart    Reps 2    Comments x 30 seconds, x 45 seconds   mild dizziness     X1 Viewing Vertical   Foot Position standing feet apart    Reps 2    Comments x 30 seconds, x 45 seconds   mild dizziness            Gaze Stabilization: Standing Feet Apart    Feet shoulder width apart, keeping eyes on target on wall 3 feet away, tilt head down 15-30 and move head side to side for 60 seconds. Repeat while moving head up and down for 60 seconds. Do 2-3 sessions per day.  Copyright  VHI. All rights reserved.   Gaze Stabilization: Tip Card  1.Target must remain in focus, not blurry, and appear stationary while head is in motion. 2.Perform exercises with small head movements (45 to either side of midline). 3.Increase speed of head motion so long as target is in focus. 4.If you wear eyeglasses, be sure you can see target through lens (therapist will give specific instructions for bifocal / progressive lenses). 5.These exercises may provoke dizziness or nausea. Work through these symptoms. If too dizzy, slow head movement slightly. Rest between each exercise. 6.Exercises demand concentration; avoid distractions. 7.For safety, perform standing exercises close to a counter, wall, corner, or next to someone.  Copyright  VHI. All rights reserved.    Balance Exercises - 11/06/20 0001       Balance Exercises: Standing   Standing Eyes Opened Wide (BOA);Head turns;Foam/compliant surface;Limitations    Standing Eyes Opened Limitations completed horiz/vertical head turns x 10 reps each direction.    Standing Eyes Closed Wide (BOA);Head turns;Foam/compliant surface;3 reps;30 secs;Limitations    Standing Eyes Closed Limitations on airex without UE support, mild postural sway    Tandem Gait Forward;Intermittent  upper extremity support;3 reps;Limitations    Tandem Gait Limitations x 3 laps down and back at countertop.            Initiated HEP:  Access Code: [email or web address] URL: [email or web address] Date: 11/06/2020 Prepared by: Baldomero Lamy  Exercises Standing Balance with Eyes Closed on Foam - 1 x daily - 5 x weekly - 1 sets - 3 reps - 30 seconds hold Standing with Head Rotation on  Pillow - 1 x daily - 5 x weekly - 2 sets - 10 reps Standing with Head Nod on Pillow - 1 x daily - 5 x weekly - 2 sets - 10 reps Tandem Walking with Counter Support - 1 x daily - 5 x weekly - 1 sets - 3-4 reps      PT Education - 11/06/20 1125     Education Details HEP Provided    Person(s) Educated Patient    Methods Explanation;Demonstration;Handout    Comprehension Verbalized understanding;Returned demonstration              PT Short Term Goals - 10/30/20 1613       PT SHORT TERM GOAL #1   Title Patient will be independent with initial HEP for balance/vestibular (ALL STGs Due: 11/23/20)    Baseline no HEP established    Time 3    Period Weeks    Status New    Target Date 11/23/20      PT SHORT TERM GOAL #2   Title Patient will be able to hold situation 3 of M-CTSIB for >/= 30 seconds    Baseline 20 seconds    Time 3    Period Weeks    Status New      PT SHORT TERM GOAL #3   Title FGA to be assessed and LTG to be set    Baseline TBA    Time 3    Period Weeks    Status New               PT Long Term Goals - 10/30/20 1614       PT LONG TERM GOAL #1   Title Patient will be independent with final vestibular/balance HEP (All LTGs Due: 12/14/20)    Baseline no HEP established    Time 6    Period Weeks    Status New    Target Date 12/14/20      PT LONG TERM GOAL #2   Title Patient will demo </= 2 line difference on DVA to indicative improved VOR function    Baseline 3 line difference    Time 6    Period Weeks    Status New      PT LONG TERM GOAL #3   Title  LTG to be set for FGA    Baseline TBA    Time 6    Period Weeks    Status New      PT LONG TERM GOAL #4   Title Patient will report </= 1/5 on all components of MSQ to demo improved tolerance for functional movement    Baseline see flowsheet    Time 6    Period Weeks    Status New      PT LONG TERM GOAL #5   Title Patient will be able to hold situation 4 of M-CTSIB for >/= 20 seconds    Baseline <5 seconds    Time 6    Period Weeks    Status New      Additional Long Term Goals   Additional Long Term Goals Yes      PT LONG TERM GOAL #6   Title Patient will improve FOTO (DPS) to >/= 60%    Baseline 52%    Time 6    Period Weeks    Status New                   Plan - 11/06/20 1143  Clinical Impression Statement Completed further balance assesment with patient scoring 20/30 today demonstrating medium fall risk. Most challenge noted with head turns and eyes closed. Rest of session spent establishing HEP focused on VOR and corner balance exercises. paitent tolerating well but require intermittent cues for proper completion. WIll continue to progress toward all LTGs.    Personal Factors and Comorbidities Comorbidity 3+    Comorbidities Arthritis, HLD, LBP, Depression, Osteoporosis, PAF, HTN    Examination-Activity Limitations Bed Mobility;Bend;Locomotion Level;Stairs;Caring for Others    Examination-Participation Restrictions Cleaning;Community Activity;Yard Work    Stability/Clinical Decision Making Stable/Uncomplicated    Rehab Potential Good    PT Frequency 2x / week    PT Duration 6 weeks    PT Treatment/Interventions ADLs/Self Care Home Management;Canalith Repostioning;Cryotherapy;Moist Heat;Gait training;DME Instruction;Functional mobility training;Stair training;Therapeutic exercise;Balance training;Therapeutic activities;Neuromuscular re-education;Patient/family education;Manual techniques;Dry needling;Passive range of motion;Vestibular;Joint Manipulations     PT Next Visit Plan How was HEP? continue balance exercises. habituation to 180 turns/head movement.    Consulted and Agree with Plan of Care Patient             Patient will benefit from skilled therapeutic intervention in order to improve the following deficits and impairments:  Decreased balance, Decreased activity tolerance, Dizziness, Postural dysfunction, Difficulty walking  Visit Diagnosis: Unsteadiness on feet  Dizziness and giddiness  Other abnormalities of gait and mobility     Problem List Patient Active Problem List   Diagnosis Date Noted   Vertigo 10/26/2020   Gait abnormality 10/26/2020   Cerebral vascular disease 10/26/2020   Essential hypertension 12/28/2019   PAF (paroxysmal atrial fibrillation) ([place]) 12/28/2019   Long term (current) use of anticoagulants 12/28/2019   Mixed hyperlipidemia 03/25/2017   Anxiety state 03/25/2017   Vitamin deficiency 03/25/2017   Osteopenia 03/25/2017   Major depression 03/25/2017   Senile purpura ([place]) 03/25/2017   Allergic rhinitis 03/25/2017    Jones Bales, PT, DPT 11/06/2020, 11:44 AM  [facility] [address] [place], [place], [address] Phone: [phone]   Fax:  [phone]  Name: Natividad Hiemstra MRN: [id number] Date of Birth: 12/17/1945

## 2020-11-06 NOTE — Patient Instructions (Addendum)
Gaze Stabilization: Standing Feet Apart    Feet shoulder width apart, keeping eyes on target on wall 3 feet away, tilt head down 15-30 and move head side to side for 60 seconds. Repeat while moving head up and down for 60 seconds. Do 2-3 sessions per day.  Copyright  VHI. All rights reserved.   Gaze Stabilization: Tip Card  1.Target must remain in focus, not blurry, and appear stationary while head is in motion. 2.Perform exercises with small head movements (45 to either side of midline). 3.Increase speed of head motion so long as target is in focus. 4.If you wear eyeglasses, be sure you can see target through lens (therapist will give specific instructions for bifocal / progressive lenses). 5.These exercises may provoke dizziness or nausea. Work through these symptoms. If too dizzy, slow head movement slightly. Rest between each exercise. 6.Exercises demand concentration; avoid distractions. 7.For safety, perform standing exercises close to a counter, wall, corner, or next to someone.  Copyright  VHI. All rights reserved.     Access Code: [email or web address] URL: [email or web address] Date: 11/06/2020 Prepared by: Baldomero Lamy  Exercises Standing Balance with Eyes Closed on Foam - 1 x daily - 5 x weekly - 1 sets - 3 reps - 30 seconds hold Standing with Head Rotation on Pillow - 1 x daily - 5 x weekly - 2 sets - 10 reps Standing with Head Nod on Pillow - 1 x daily - 5 x weekly - 2 sets - 10 reps Tandem Walking with Counter Support - 1 x daily - 5 x weekly - 1 sets - 3-4 reps

## 2020-11-07 ENCOUNTER — Ambulatory Visit (HOSPITAL_COMMUNITY)
Admission: RE | Admit: 2020-11-07 | Discharge: 2020-11-07 | Disposition: A | Discharge status: Home / Self Care | Payer: Medicare Other | Source: Ambulatory Visit | Attending: Neurology | Admitting: Neurology

## 2020-11-07 ENCOUNTER — Ambulatory Visit (HOSPITAL_BASED_OUTPATIENT_CLINIC_OR_DEPARTMENT_OTHER)
Admission: RE | Admit: 2020-11-07 | Discharge: 2020-11-07 | Disposition: A | Discharge status: Home / Self Care | Payer: Medicare Other | Source: Ambulatory Visit | Attending: Neurology | Admitting: Neurology

## 2020-11-07 DIAGNOSIS — R42 Dizziness and giddiness: Secondary | ICD-10-CM

## 2020-11-07 DIAGNOSIS — I10 Essential (primary) hypertension: Secondary | ICD-10-CM | POA: Insufficient documentation

## 2020-11-07 DIAGNOSIS — E785 Hyperlipidemia, unspecified: Secondary | ICD-10-CM | POA: Insufficient documentation

## 2020-11-07 DIAGNOSIS — R269 Unspecified abnormalities of gait and mobility: Secondary | ICD-10-CM

## 2020-11-07 DIAGNOSIS — I679 Cerebrovascular disease, unspecified: Secondary | ICD-10-CM

## 2020-11-07 DIAGNOSIS — I6389 Other cerebral infarction: Secondary | ICD-10-CM

## 2020-11-07 NOTE — Progress Notes (Signed)
  Echocardiogram 2D Echocardiogram has been performed.  Carly Myers 11/07/2020, 9:41 AM

## 2020-11-07 NOTE — Progress Notes (Deleted)
Lower extremity venous has been completed.   Preliminary results in CV Proc.   Carly Myers 11/07/2020 9:35 AM

## 2020-11-07 NOTE — Progress Notes (Signed)
Carotid duplex bilateral study completed.   Please see CV Proc for preliminary results.   Franceska Strahm, RDMS, RVT  

## 2020-11-08 ENCOUNTER — Ambulatory Visit: Payer: Medicare Other

## 2020-11-08 ENCOUNTER — Other Ambulatory Visit: Payer: Self-pay

## 2020-11-08 DIAGNOSIS — R2689 Other abnormalities of gait and mobility: Secondary | ICD-10-CM | POA: Diagnosis not present

## 2020-11-08 DIAGNOSIS — R2681 Unsteadiness on feet: Secondary | ICD-10-CM | POA: Diagnosis not present

## 2020-11-08 DIAGNOSIS — R42 Dizziness and giddiness: Secondary | ICD-10-CM

## 2020-11-08 LAB — ECHOCARDIOGRAM COMPLETE
Area-P 1/2: 2.93 cm2
S' Lateral: 2.7 cm
Single Plane A4C EF: 49.9 %

## 2020-11-08 NOTE — Therapy (Signed)
[facility] [address] [place], [place], [address] Phone: [phone]   Fax:  [phone]  Physical Therapy Treatment  Patient Details  Name: Carly Myers MRN: [id number] Date of Birth: 1945-12-10 Referring Provider (PT): Billey Chang, MD   Encounter Date: 11/08/2020   PT End of Session - 11/08/20 1152     Visit Number 3    Number of Visits 13    Date for PT Re-Evaluation 12/14/20    Authorization Type Medicare A + B; BCBS Supplemental    Progress Note Due on Visit 10    PT Start Time 1147    PT Stop Time 1227    PT Time Calculation (min) 40 min    Activity Tolerance Patient tolerated treatment well    Behavior During Therapy [facility] for tasks assessed/performed             Past Medical History:  Diagnosis Date   Allergic rhinitis    Anaphylactic reaction to bee sting    Anxiety state    unspecified   Arthritis    Ortho Dr. Berenice Primas   Disorder of bone    unspecified   Essential hypertension 12/28/2019   Hypercholesteremia    migrated   Hyperlipemia    Long term (current) use of anticoagulants 12/28/2019   PAF   Low back pain    Major depression    in complete remission   Osteopenia    Osteoporosis    PAF (paroxysmal atrial fibrillation) ([place]) 12/28/2019   Post-menopausal    Senile purpura ([place])    Shingles    Sinusitis    UTI (urinary tract infection)    Vitamin D deficiency     Past Surgical History:  Procedure Laterality Date   NO PAST SURGERIES      There were no vitals filed for this visit.   Subjective Assessment - 11/08/20 1149     Subjective Patient reports felt good after last visit, but then has some dizziness with exercises. No other new changes.    Pertinent History Arthritis, HLD, LBP, Depression, Osteoporosis, PAF, HTN    Limitations Standing;Walking    Diagnostic tests MRI of the brain on October 18, 2020, no acute abnormality, moderate small vessel disease, evidence of remote  infarction in the left paramedian cerebellar.    Currently in Pain? Yes    Pain Score 5     Pain Location Head    Pain Orientation Anterior    Pain Descriptors / Indicators Nagging;Headache    Pain Type Acute pain    Pain Onset Today    Pain Frequency Intermittent              [facility] Adult PT Treatment/Exercise - 11/08/20 0001       Transfers   Transfers Sit to Stand;Stand to Sit    Number of Reps 10 reps;2 sets   completed sit <> stands with BLE placed on airex x 10 reps. Completed 2 sets, with rest break between.     Ambulation/Gait   Ambulation/Gait Yes    Ambulation/Gait Assistance 5: Supervision    Ambulation/Gait Assistance Details throughout therapy gym with activities    Assistive device None    Gait Pattern Wide base of support    Ambulation Surface Level;Indoor      High Level Balance   High Level Balance Activities Head turns    High Level Balance Comments Completed ambulation 3 x 115' working on ambulation with horizontal/vertical head turns. Slowly progressing speed of  head movement.             Vestibular Treatment/Exercise - 11/08/20 0001       Vestibular Treatment/Exercise   Vestibular Treatment Provided Gaze    Gaze Exercises X1 Viewing Horizontal;X1 Viewing Vertical      X1 Viewing Horizontal   Foot Position standing feet apart firm > standing feet apart together    Reps 2    Comments x 60 seconds      X1 Viewing Vertical   Foot Position standing feet apart firm > standing feet apart together    Reps 2    Comments x 60 seconds. PT eduating patient how to use timer on apple watch for improved completion vs. counting.                Balance Exercises - 11/08/20 0001       Balance Exercises: Standing   Standing Eyes Opened Wide (BOA);Head turns;Foam/compliant surface;Limitations    Standing Eyes Opened Limitations completed horiz/vertical head turns x 10 reps each direction. more sway noted with horiz > vertical.    Standing Eyes  Closed Wide (BOA);Head turns;Foam/compliant surface;3 reps;30 secs;Limitations    Standing Eyes Closed Limitations on airex without UE support, mild postural sway, EC 3 x 30 seconds. Then with EC completed horizontal/vertical head turns x 10 reps. increased sway with addition of head movement, CGA. icnreased challenge with vertical > horizontal.    SLS Eyes open;Solid surface;3 reps;Limitations    SLS Limitations completd 10 seconds x 3 reps on LLE, then on RLE completed 3 x 5-10 seconds. Increased challenge with SLS on RLE.    Marching Foam/compliant surface;Static;Limitations    Marching Limitations completed alternating slow marching with no UE support on airex, x 10 reps bilat. slow pace to promote SLS                  PT Short Term Goals - 10/30/20 1613       PT SHORT TERM GOAL #1   Title Patient will be independent with initial HEP for balance/vestibular (ALL STGs Due: 11/23/20)    Baseline no HEP established    Time 3    Period Weeks    Status New    Target Date 11/23/20      PT SHORT TERM GOAL #2   Title Patient will be able to hold situation 3 of M-CTSIB for >/= 30 seconds    Baseline 20 seconds    Time 3    Period Weeks    Status New      PT SHORT TERM GOAL #3   Title FGA to be assessed and LTG to be set    Baseline TBA    Time 3    Period Weeks    Status New               PT Long Term Goals - 10/30/20 1614       PT LONG TERM GOAL #1   Title Patient will be independent with final vestibular/balance HEP (All LTGs Due: 12/14/20)    Baseline no HEP established    Time 6    Period Weeks    Status New    Target Date 12/14/20      PT LONG TERM GOAL #2   Title Patient will demo </= 2 line difference on DVA to indicative improved VOR function    Baseline 3 line difference    Time 6    Period Weeks    Status New  PT LONG TERM GOAL #3   Title LTG to be set for FGA    Baseline TBA    Time 6    Period Weeks    Status New      PT LONG TERM  GOAL #4   Title Patient will report </= 1/5 on all components of MSQ to demo improved tolerance for functional movement    Baseline see flowsheet    Time 6    Period Weeks    Status New      PT LONG TERM GOAL #5   Title Patient will be able to hold situation 4 of M-CTSIB for >/= 20 seconds    Baseline <5 seconds    Time 6    Period Weeks    Status New      Additional Long Term Goals   Additional Long Term Goals Yes      PT LONG TERM GOAL #6   Title Patient will improve FOTO (DPS) to >/= 60%    Baseline 52%    Time 6    Period Weeks    Status New                   Plan - 11/08/20 1227     Clinical Impression Statement Reviewed VOR, progressing to narrow BOS in session with increased postural sway. No instances of dizziness during today's session. Continued balance activities on complaint surfaces. Most challenge noted with SLS on RLE and with eyes closed. Will continue to progress toawrd all LTGs.    Personal Factors and Comorbidities Comorbidity 3+    Comorbidities Arthritis, HLD, LBP, Depression, Osteoporosis, PAF, HTN    Examination-Activity Limitations Bed Mobility;Bend;Locomotion Level;Stairs;Caring for Others    Examination-Participation Restrictions Cleaning;Community Activity;Yard Work    Stability/Clinical Decision Making Stable/Uncomplicated    Rehab Potential Good    PT Frequency 2x / week    PT Duration 6 weeks    PT Treatment/Interventions ADLs/Self Care Home Management;Canalith Repostioning;Cryotherapy;Moist Heat;Gait training;DME Instruction;Functional mobility training;Stair training;Therapeutic exercise;Balance training;Therapeutic activities;Neuromuscular re-education;Patient/family education;Manual techniques;Dry needling;Passive range of motion;Vestibular;Joint Manipulations    PT Next Visit Plan Continue VOR progression. Balance on Incline. Rockerboard. Challenge Vestibular Input. Habituation to 180 turns/head movement.    Consulted and Agree with  Plan of Care Patient             Patient will benefit from skilled therapeutic intervention in order to improve the following deficits and impairments:  Decreased balance, Decreased activity tolerance, Dizziness, Postural dysfunction, Difficulty walking  Visit Diagnosis: Unsteadiness on feet  Dizziness and giddiness  Other abnormalities of gait and mobility     Problem List Patient Active Problem List   Diagnosis Date Noted   Vertigo 10/26/2020   Gait abnormality 10/26/2020   Cerebral vascular disease 10/26/2020   Essential hypertension 12/28/2019   PAF (paroxysmal atrial fibrillation) ([place]) 12/28/2019   Long term (current) use of anticoagulants 12/28/2019   Mixed hyperlipidemia 03/25/2017   Anxiety state 03/25/2017   Vitamin deficiency 03/25/2017   Osteopenia 03/25/2017   Major depression 03/25/2017   Senile purpura ([place]) 03/25/2017   Allergic rhinitis 03/25/2017    Jones Bales, PT, DPT 11/08/2020, 12:29 PM  [facility] [address] [place], [place], [address] Phone: [phone]   Fax:  [phone]  Name: Carly Myers MRN: [id number] Date of Birth: November 10, 1945

## 2020-11-13 ENCOUNTER — Ambulatory Visit: Payer: Medicare Other | Admitting: Physical Therapy

## 2020-11-13 ENCOUNTER — Other Ambulatory Visit: Payer: Self-pay

## 2020-11-13 DIAGNOSIS — R2689 Other abnormalities of gait and mobility: Secondary | ICD-10-CM

## 2020-11-13 DIAGNOSIS — R2681 Unsteadiness on feet: Secondary | ICD-10-CM

## 2020-11-13 DIAGNOSIS — R42 Dizziness and giddiness: Secondary | ICD-10-CM | POA: Diagnosis not present

## 2020-11-14 ENCOUNTER — Ambulatory Visit (INDEPENDENT_AMBULATORY_CARE_PROVIDER_SITE_OTHER): Payer: Medicare Other | Admitting: Physician Assistant

## 2020-11-14 ENCOUNTER — Encounter: Payer: Self-pay | Admitting: Physician Assistant

## 2020-11-14 VITALS — BP 130/70 | HR 54 | Temp 98.0°F | Ht 66.0 in | Wt 122.2 lb

## 2020-11-14 DIAGNOSIS — H9209 Otalgia, unspecified ear: Secondary | ICD-10-CM | POA: Diagnosis not present

## 2020-11-14 DIAGNOSIS — H6123 Impacted cerumen, bilateral: Secondary | ICD-10-CM | POA: Diagnosis not present

## 2020-11-14 MED ORDER — AMOXICILLIN 500 MG PO CAPS: 500.0000 mg | ORAL_CAPSULE | Freq: Three times a day (TID) | ORAL | 0 refills | Status: DC

## 2020-11-14 NOTE — Patient Instructions (Signed)
It was great to see you!  Start oral antibiotic, amoxicillin. Take this as prescribed.   May use cotton ball soaked in mineral oil into ear 10-20 min per week if having recurrent ear wax accumulation. May use dilute hydrogen peroxide (equal parts this and water) and place a few drops into ear every 2 weeks to help break down wax buildup.  Come back Friday if you want a re-check.  Take care,  Inda Coke PA-C

## 2020-11-14 NOTE — Therapy (Signed)
[facility] [address] [place], [place], [address] Phone: [phone]   Fax:  [phone]  Physical Therapy Treatment  Patient Details  Name: Carly Myers MRN: [id number] Date of Birth: 10-09-1945 Referring Provider (PT): Billey Chang, MD   Encounter Date: 11/13/2020   PT End of Session - 11/14/20 1527     Visit Number 4    Number of Visits 13    Date for PT Re-Evaluation 12/14/20    Authorization Type Medicare A + B; BCBS Supplemental    Progress Note Due on Visit 10    PT Start Time 1146    PT Stop Time 1230    PT Time Calculation (min) 44 min    Activity Tolerance Patient tolerated treatment well    Behavior During Therapy [facility] for tasks assessed/performed             Past Medical History:  Diagnosis Date   Allergic rhinitis    Anaphylactic reaction to bee sting    Anxiety state    unspecified   Arthritis    Ortho Dr. Berenice Primas   Disorder of bone    unspecified   Essential hypertension 12/28/2019   Hypercholesteremia    migrated   Hyperlipemia    Long term (current) use of anticoagulants 12/28/2019   PAF   Low back pain    Major depression    in complete remission   Osteopenia    Osteoporosis    PAF (paroxysmal atrial fibrillation) ([place]) 12/28/2019   Post-menopausal    Senile purpura ([place])    Shingles    Sinusitis    UTI (urinary tract infection)    Vitamin D deficiency     Past Surgical History:  Procedure Laterality Date   NO PAST SURGERIES      There were no vitals filed for this visit.   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    Currently in Pain? --   pt has intermittent earache when she turns her head   Pain Location Ear    Pain Orientation Left    Pain Type Acute pain    Pain Onset In the past 7 days    Pain Frequency Intermittent                                Vestibular Treatment/Exercise - 11/14/20 0001       Vestibular Treatment/Exercise   Vestibular Treatment Provided Gaze      X1 Viewing Horizontal   Foot Position bil. stance on floor    Time --   60 secs, 30 secs 2nd rep standing on 2 pillows   Reps 2   different surfaces with each rep   Comments 60 secs standing on floor; 30 secs standing on 2 pillows; target on plain background with each rep      X1 Viewing Vertical   Foot Position bil. stance on floor    Time --   60  secs, 30 secs 2nd rep   Reps 2    Comments 60 secs standing on floor; 30 secs standing on 2 pillows; target on plain background for each rep                Balance Exercises - 11/14/20 0001       Balance Exercises: Standing   Standing Eyes Opened Narrow base of support (BOS);Head turns;Foam/compliant surface;5 reps   horizontal & vertical head turns   Standing Eyes Closed Narrow base of support (BOS);Head turns;Foam/compliant surface;5 reps   horizontal & vertical head turns   Standing Eyes Closed Limitations standing on 2 pillows in corner    Rockerboard Anterior/posterior;Head turns;EO;EC;Other reps (comment)   40 reps total; EO, EC without head turns:  EC with horizontal and vertical head turns 10 reps each with UE support on // bars   Marching Foam/compliant surface;Solid surface;Head turns;Static;10 reps   10 reps standing on floor; progressed to marching on compliant surface with EO with head turns, with EC 10 reps: then added head turns horizontally & vertically with EC with CGA to min assist - with use of walls prn balance recovery   Other Standing Exercises  Pt performed stepping down from pillows to floor with head turns - 10 reps each leg - to improve SLS on each leg with standing on compliant surface    Other Standing Exercises Comments Pt performed marching on incline in place with EO, then with EC 10 reps each; marching with horizontal head turn with EO; marching with EC but NO head turn added with this exercise due to difficulty with maintaining balance                PT Education - 11/14/20 1524     Education Details instructed pt to progress x1 viewing exercise to standing on pillow - but keep target on plain background - due to pt having more difficulty with balance than with visual function as no symptoms provoked with x1 viewing    Person(s) Educated Patient    Methods Explanation    Comprehension Verbalized understanding;Returned demonstration              PT Short Term Goals - 11/14/20 1531       PT SHORT TERM GOAL #1   Title Patient will be independent with initial HEP for balance/vestibular (ALL STGs Due: 11/23/20)    Baseline no HEP established    Time 3    Period Weeks    Status New    Target Date 11/23/20      PT SHORT TERM GOAL #2   Title Patient will be able to hold situation 3 of M-CTSIB for >/= 30 seconds    Baseline 20 seconds    Time 3    Period Weeks    Status New      PT SHORT TERM GOAL #3   Title FGA to be assessed and LTG to be set    Baseline TBA    Time 3    Period Weeks    Status New               PT Long Term Goals - 11/14/20 1531       PT LONG TERM GOAL #1   Title Patient will be independent with final vestibular/balance HEP (All LTGs Due: 12/14/20)    Baseline no HEP established    Time 6    Period Weeks    Status New  PT LONG TERM GOAL #2   Title Patient will demo </= 2 line difference on DVA to indicative improved VOR function    Baseline 3 line difference    Time 6    Period Weeks    Status New      PT LONG TERM GOAL #3   Title LTG to be set for FGA     Baseline TBA    Time 6    Period Weeks    Status New      PT LONG TERM GOAL #4   Title Patient will report </= 1/5 on all components of MSQ to demo improved tolerance for functional movement    Baseline see flowsheet    Time 6    Period Weeks    Status New      PT LONG TERM GOAL #5   Title Patient will be able to hold situation 4 of M-CTSIB for >/= 20 seconds    Baseline <5 seconds    Time 6    Period Weeks    Status New      PT LONG TERM GOAL #6   Title Patient will improve FOTO (DPS) to >/= 60%    Baseline 52%    Time 6    Period Weeks    Status New                   Plan - 11/14/20 1527     Clinical Impression Statement Pt able to perform x1 viewing for 60 secs standing on floor with target on plain background with no increase in symptoms; pt has more difficulty maintaining balance on compliant surfaces so this exercise was progressed to standing on compliant surface with target remaining on plain background.  Pt has decreased vestibular input in maintaining balance as evidenced by unsteadiness and postural sway with LOB at times when standing with EC on compliant surfaces.  Will cont with POC to progress towards LTG's.    Personal Factors and Comorbidities Comorbidity 3+    Comorbidities Arthritis, HLD, LBP, Depression, Osteoporosis, PAF, HTN    Examination-Activity Limitations Bed Mobility;Bend;Locomotion Level;Stairs;Caring for Others    Examination-Participation Restrictions Cleaning;Community Activity;Yard Work    Stability/Clinical Decision Making Stable/Uncomplicated    Rehab Potential Good    PT Frequency 2x / week    PT Duration 6 weeks    PT Treatment/Interventions ADLs/Self Care Home Management;Canalith Repostioning;Cryotherapy;Moist Heat;Gait training;DME Instruction;Functional mobility training;Stair training;Therapeutic exercise;Balance training;Therapeutic activities;Neuromuscular re-education;Patient/family education;Manual techniques;Dry  needling;Passive range of motion;Vestibular;Joint Manipulations    PT Next Visit Plan Check VOR progression (added standing on compliant surface with target remaining on plain background);  Balance on Incline. Rockerboard. Challenge Vestibular Input. Habituation to 180 turns/head movement.    Consulted and Agree with Plan of Care Patient             Patient will benefit from skilled therapeutic intervention in order to improve the following deficits and impairments:  Decreased balance, Decreased activity tolerance, Dizziness, Postural dysfunction, Difficulty walking  Visit Diagnosis: Unsteadiness on feet  Dizziness and giddiness  Other abnormalities of gait and mobility     Problem List Patient Active Problem List   Diagnosis Date Noted   Vertigo 10/26/2020   Gait abnormality 10/26/2020   Cerebral vascular disease 10/26/2020   Essential hypertension 12/28/2019   PAF (paroxysmal atrial fibrillation) ([place]) 12/28/2019   Long term (current) use of anticoagulants 12/28/2019   Mixed hyperlipidemia 03/25/2017   Anxiety state 03/25/2017   Vitamin deficiency 03/25/2017  Osteopenia 03/25/2017   Major depression 03/25/2017   Senile purpura ([place]) 03/25/2017   Allergic rhinitis 03/25/2017    DildayJenness Corner, PT 11/14/2020, 3:33 PM  [facility] [address] [place], [place], [address] Phone: [phone]   Fax:  [phone]  Name: Delta Pichon MRN: [id number] Date of Birth: September 28, 1945

## 2020-11-14 NOTE — Progress Notes (Signed)
Carly Myers is a 75 y.o. female here for a new problem.  I acted as a Education administrator for [employer], PA-C Anselmo Pickler, LPN  History of Present Illness:   Chief Complaint  Patient presents with   Otalgia    HPI  Otalgia Pt c/o left ear ache since Sunday. Denies drainage, no fever or chills. Pt has been taking Aspirin with some relief as well as expired amoxicillin that she has at home. She endorses decreased hearing bilaterally. Has been going to vestibular rehab for dizziness.    Past Medical History:  Diagnosis Date   Allergic rhinitis    Anaphylactic reaction to bee sting    Anxiety state    unspecified   Arthritis    Ortho Dr. Berenice Primas   Disorder of bone    unspecified   Essential hypertension 12/28/2019   Hypercholesteremia    migrated   Hyperlipemia    Long term (current) use of anticoagulants 12/28/2019   PAF   Low back pain    Major depression    in complete remission   Osteopenia    Osteoporosis    PAF (paroxysmal atrial fibrillation) ([place]) 12/28/2019   Post-menopausal    Senile purpura ([place])    Shingles    Sinusitis    UTI (urinary tract infection)    Vitamin D deficiency      Social History   Tobacco Use   Smoking status: Former    Types: Cigarettes    Quit date: 02/25/2008    Years since quitting: 12.7   Smokeless tobacco: Never  Vaping Use   Vaping Use: Never used  Substance Use Topics   Alcohol use: Yes    Comment: wine-1 drink nightly w/ dinner   Drug use: Never    Past Surgical History:  Procedure Laterality Date   NO PAST SURGERIES      History reviewed. No pertinent family history.  Allergies  Allergen Reactions   Bee Venom     Current Medications:   Current Outpatient Medications:    amoxicillin (AMOXIL) 500 MG capsule, Take 1 capsule (500 mg total) by mouth 3 (three) times daily., Disp: 30 capsule, Rfl: 0   Calcium Carb-Cholecalciferol (CALCIUM + D3) 600-200 MG-UNIT TABS, Take 1 tablet by mouth daily., Disp: , Rfl:     Cholecalciferol (VITAMIN D) 2000 units CAPS, Take 2,000 Units by mouth daily., Disp: , Rfl:    Cyanocobalamin (B-12 PO), Take by mouth., Disp: , Rfl:    EPINEPHrine 0.3 mg/0.3 mL IJ SOAJ injection, Inject 0.3 mg into the muscle once., Disp: , Rfl:    Krill Oil 1000 MG CAPS, Take 1 capsule by mouth daily., Disp: , Rfl:    LORazepam (ATIVAN) 0.5 MG tablet, Take 1 tablet (0.5 mg total) by mouth at bedtime as needed for sleep., Disp: 30 tablet, Rfl: 0   Magnesium Hydroxide (MAGNESIA PO), Take 1 tablet by mouth daily., Disp: , Rfl:    metoprolol succinate (TOPROL-XL) 25 MG 24 hr tablet, TAKE ONE-HALF (1/2) TABLET TWICE A DAY, Disp: 90 tablet, Rfl: 2   rivaroxaban (XARELTO) 20 MG TABS tablet, TAKE 1 TABLET DAILY WITH SUPPER, Disp: 90 tablet, Rfl: 3   rosuvastatin (CRESTOR) 40 MG tablet, TAKE 1 TABLET DAILY, Disp: 90 tablet, Rfl: 2   Review of Systems:   ROS Negative unless otherwise specified per HPI.  Vitals:   Vitals:   11/14/20 1601  BP: 130/70  Pulse: (!) 54  Temp: 98 F (36.7 C)  TempSrc: Temporal  SpO2: 97%  Weight: 122 lb 4 oz (55.5 kg)  Height: 5\' 6"  (1.676 m)     Body mass index is 19.73 kg/m.  Physical Exam:   Physical Exam Vitals and nursing note reviewed.  Constitutional:      General: She is not in acute distress.    Appearance: She is well-developed. She is not ill-appearing or toxic-appearing.  HENT:     Head: Normocephalic and atraumatic.     Right Ear: Tympanic membrane, ear canal and external ear normal. There is impacted cerumen. Tympanic membrane is not erythematous, retracted or bulging.     Left Ear: Tympanic membrane, ear canal and external ear normal. There is impacted cerumen. Tympanic membrane is not erythematous, retracted or bulging.     Nose: Nose normal.     Right Sinus: No maxillary sinus tenderness or frontal sinus tenderness.     Left Sinus: No maxillary sinus tenderness or frontal sinus tenderness.     Mouth/Throat:     Pharynx: Uvula midline.  No posterior oropharyngeal erythema.  Eyes:     General: Lids are normal.     Conjunctiva/sclera: Conjunctivae normal.  Neck:     Trachea: Trachea normal.  Cardiovascular:     Rate and Rhythm: Normal rate and regular rhythm.     Pulses: Normal pulses.     Heart sounds: Normal heart sounds, S1 normal and S2 normal.  Pulmonary:     Effort: Pulmonary effort is normal.     Breath sounds: Normal breath sounds. No decreased breath sounds, wheezing, rhonchi or rales.  Lymphadenopathy:     Cervical: No cervical adenopathy.  Skin:    General: Skin is warm and dry.  Neurological:     Mental Status: She is alert.     GCS: GCS eye subscore is 4. GCS verbal subscore is 5. GCS motor subscore is 6.  Psychiatric:        Speech: Speech normal.        Behavior: Behavior normal. Behavior is cooperative.   Ceruminosis is noted.  Wax is removed by syringing and manual debridement.   Assessment and Plan:   Otalgia Unable to visualize left TM after lavage as this was only partially successful, however she did report improvement of some pain after lavage attempted R TM quite erythematous Will treat with oral amoxicillin 500 mg TID x 10 days Follow-up if worsening/new sx  Bilateral impacted cerumen Instructions for home care to prevent wax buildup are given. Given that L ear lavage only partially successful, did discuss with patient that if she would like to return for a repeat trial, she can at her convenience.  CMA or LPN served as scribe during this visit. History, Physical, and Plan performed by medical provider. The above documentation has been reviewed and is accurate and complete.  Inda Coke, PA-C

## 2020-11-16 ENCOUNTER — Other Ambulatory Visit: Payer: Self-pay

## 2020-11-16 ENCOUNTER — Encounter: Payer: Self-pay | Admitting: Physical Therapy

## 2020-11-16 ENCOUNTER — Ambulatory Visit: Payer: Medicare Other | Admitting: Physical Therapy

## 2020-11-16 DIAGNOSIS — R2681 Unsteadiness on feet: Secondary | ICD-10-CM | POA: Diagnosis not present

## 2020-11-16 DIAGNOSIS — R2689 Other abnormalities of gait and mobility: Secondary | ICD-10-CM

## 2020-11-16 DIAGNOSIS — R42 Dizziness and giddiness: Secondary | ICD-10-CM

## 2020-11-16 NOTE — Therapy (Addendum)
[facility] [address] [place], [place], [address] Phone: [phone]   Fax:  [phone]  Physical Therapy Treatment  Patient Details  Name: Carly Myers MRN: [id number] Date of Birth: 02/23/1946 Referring Provider (PT): Billey Chang, MD   Encounter Date: 11/16/2020   PT End of Session - 11/16/20 1444     Visit Number 5    Number of Visits 13    Date for PT Re-Evaluation 12/14/20    Authorization Type Medicare A + B; BCBS Supplemental    Progress Note Due on Visit 10    PT Start Time 1404    PT Stop Time 1444    PT Time Calculation (min) 40 min    Activity Tolerance Patient tolerated treatment well    Behavior During Therapy [facility] for tasks assessed/performed             Past Medical History:  Diagnosis Date   Allergic rhinitis    Anaphylactic reaction to bee sting    Anxiety state    unspecified   Arthritis    Ortho Dr. Berenice Primas   Disorder of bone    unspecified   Essential hypertension 12/28/2019   Hypercholesteremia    migrated   Hyperlipemia    Long term (current) use of anticoagulants 12/28/2019   PAF   Low back pain    Major depression    in complete remission   Osteopenia    Osteoporosis    PAF (paroxysmal atrial fibrillation) ([place]) 12/28/2019   Post-menopausal    Senile purpura ([place])    Shingles    Sinusitis    UTI (urinary tract infection)    Vitamin D deficiency     Past Surgical History:  Procedure Laterality Date   NO PAST SURGERIES      There were no vitals filed for this visit.   Subjective Assessment - 11/16/20 1406     Subjective Feeling better today. No falls.    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    Currently in Pain? No/denies     Pain Onset In the past 7 days                                Vestibular Treatment/Exercise - 11/16/20 1410       X1 Viewing Horizontal   Foot Position feet apart on 2 pillows    Reps 2    Comments 30 seconds, mild dizziness      X1 Viewing Vertical   Foot Position feet apart on 2 pillows    Reps 2    Comments 30 seconds, mild dizziness                Balance Exercises - 11/16/20 1415       Balance Exercises: Standing   Standing Eyes Opened Narrow base of support (BOS);Foam/compliant surface    Standing Eyes Opened Limitations feet hip width: x10 reps head turns, x10 reps head nods, narrow BOS: x5 reps head turns, x5 reps head nods - incr difficulty with narrow BOS and needing UE support    Standing Eyes Closed Wide (BOA);Foam/compliant surface;3 reps;30 secs    Standing Eyes Closed Limitations on air ex, wide BOS> feet hip width  SLS Eyes open;Solid surface;3 reps;Limitations    SLS Limitations on level ground: alternating toe taps to cone x10 reps bilat, then forward/cross body cone tap x6 reps bilat, min guard for balance. on single pillow: alternating toe taps to cone x10 reps bilat, incr difficulty on RLE    Rockerboard Anterior/posterior;Lateral;Limitations    Rockerboard Limitations lateral: weight shifting x10 reps with 3 second holds at end range, A/P: alternating posterior stepping x10 reps bilat with UE support    Tandem Gait Forward;Intermittent upper extremity support;3 reps;Limitations    Tandem Gait Limitations down and back x1 rep, then x3 reps adding in head turns, x3 reps head nods, needing intermittent UE support                  PT Short Term Goals - 11/14/20 1531       PT SHORT TERM GOAL #1   Title Patient will be independent with initial HEP for balance/vestibular (ALL STGs Due: 11/23/20)    Baseline no HEP established    Time 3    Period Weeks    Status New    Target Date 11/23/20      PT SHORT TERM GOAL #2    Title Patient will be able to hold situation 3 of M-CTSIB for >/= 30 seconds    Baseline 20 seconds    Time 3    Period Weeks    Status New      PT SHORT TERM GOAL #3   Title FGA to be assessed and LTG to be set    Baseline TBA    Time 3    Period Weeks    Status New               PT Long Term Goals - 11/14/20 1531       PT LONG TERM GOAL #1   Title Patient will be independent with final vestibular/balance HEP (All LTGs Due: 12/14/20)    Baseline no HEP established    Time 6    Period Weeks    Status New      PT LONG TERM GOAL #2   Title Patient will demo </= 2 line difference on DVA to indicative improved VOR function    Baseline 3 line difference    Time 6    Period Weeks    Status New      PT LONG TERM GOAL #3   Title LTG to be set for FGA    Baseline TBA    Time 6    Period Weeks    Status New      PT LONG TERM GOAL #4   Title Patient will report </= 1/5 on all components of MSQ to demo improved tolerance for functional movement    Baseline see flowsheet    Time 6    Period Weeks    Status New      PT LONG TERM GOAL #5   Title Patient will be able to hold situation 4 of M-CTSIB for >/= 20 seconds    Baseline <5 seconds    Time 6    Period Weeks    Status New      PT LONG TERM GOAL #6   Title Patient will improve FOTO (DPS) to >/= 60%    Baseline 52%    Time 6    Period Weeks    Status New  Plan - 11/16/20 1628     Clinical Impression Statement Continued to work on balance with incr vestibular input on compliant surfaces with eyes closed and with head motions. Pt challenged by narrow BOS with head nods with eyes open, needing min A at times for balance. Pt challenged more with SLS tasks while standing on RLE, esp when crossing midline. Will continue to progress towards LTGs.    Personal Factors and Comorbidities Comorbidity 3+    Comorbidities Arthritis, HLD, LBP, Depression, Osteoporosis, PAF, HTN     Examination-Activity Limitations Bed Mobility;Bend;Locomotion Level;Stairs;Caring for Others    Examination-Participation Restrictions Cleaning;Community Activity;Yard Work    Stability/Clinical Decision Making Stable/Uncomplicated    Rehab Potential Good    PT Frequency 2x / week    PT Duration 6 weeks    PT Treatment/Interventions ADLs/Self Care Home Management;Canalith Repostioning;Cryotherapy;Moist Heat;Gait training;DME Instruction;Functional mobility training;Stair training;Therapeutic exercise;Balance training;Therapeutic activities;Neuromuscular re-education;Patient/family education;Manual techniques;Dry needling;Passive range of motion;Vestibular;Joint Manipulations    PT Next Visit Plan Check VOR progression (added standing on compliant surface with target remaining on plain background);  Balance on Incline. Rockerboard. Challenge Vestibular Input. Habituation to 180 turns/head movement.    Consulted and Agree with Plan of Care Patient             Patient will benefit from skilled therapeutic intervention in order to improve the following deficits and impairments:  Decreased balance, Decreased activity tolerance, Dizziness, Postural dysfunction, Difficulty walking  Visit Diagnosis: Unsteadiness on feet  Dizziness and giddiness  Other abnormalities of gait and mobility     Problem List Patient Active Problem List   Diagnosis Date Noted   Vertigo 10/26/2020   Gait abnormality 10/26/2020   Cerebral vascular disease 10/26/2020   Essential hypertension 12/28/2019   PAF (paroxysmal atrial fibrillation) ([place]) 12/28/2019   Long term (current) use of anticoagulants 12/28/2019   Mixed hyperlipidemia 03/25/2017   Anxiety state 03/25/2017   Vitamin deficiency 03/25/2017   Osteopenia 03/25/2017   Major depression 03/25/2017   Senile purpura ([place]) 03/25/2017   Allergic rhinitis 03/25/2017    Arliss Journey, PT, DPT  11/16/2020, 4:29 PM  [facility] [address] [place], [place], [address] Phone: [phone]   Fax:  [phone]  Name: Carly Myers MRN: [id number] Date of Birth: 1945-04-05

## 2020-11-20 ENCOUNTER — Ambulatory Visit: Payer: Medicare Other

## 2020-11-20 ENCOUNTER — Other Ambulatory Visit: Payer: Self-pay

## 2020-11-20 DIAGNOSIS — R2681 Unsteadiness on feet: Secondary | ICD-10-CM

## 2020-11-20 DIAGNOSIS — R42 Dizziness and giddiness: Secondary | ICD-10-CM

## 2020-11-20 DIAGNOSIS — R2689 Other abnormalities of gait and mobility: Secondary | ICD-10-CM | POA: Diagnosis not present

## 2020-11-20 NOTE — Patient Instructions (Signed)
Access Code: [email or web address] URL: [email or web address] Date: 11/20/2020 Prepared by: Baldomero Lamy  Exercises Standing Balance with Eyes Closed on Foam - 1 x daily - 5 x weekly - 1 sets - 3 reps - 30 seconds hold Standing with Head Rotation on Pillow - 1 x daily - 5 x weekly - 2 sets - 10 reps Standing with Head Nod on Pillow - 1 x daily - 5 x weekly - 2 sets - 10 reps Tandem Walking with Counter Support - 1 x daily - 5 x weekly - 1 sets - 3-4 reps Walking with Head Rotation - 1 x daily - 5 x weekly - 1 sets - 3-4 reps

## 2020-11-20 NOTE — Therapy (Signed)
[facility] [address] [place], [place], [address] Phone: [phone]   Fax:  [phone]  Physical Therapy Treatment  Patient Details  Name: Carly Myers MRN: [id number] Date of Birth: 08/09/1945 Referring Provider (PT): Billey Chang, MD   Encounter Date: 11/20/2020   PT End of Session - 11/20/20 1449     Visit Number 6    Number of Visits 13    Date for PT Re-Evaluation 12/14/20    Authorization Type Medicare A + B; BCBS Supplemental    Progress Note Due on Visit 10    PT Start Time 1446    PT Stop Time 1528    PT Time Calculation (min) 42 min    Equipment Utilized During Treatment Gait belt    Activity Tolerance Patient tolerated treatment well    Behavior During Therapy WFL for tasks assessed/performed             Past Medical History:  Diagnosis Date   Allergic rhinitis    Anaphylactic reaction to bee sting    Anxiety state    unspecified   Arthritis    Ortho Dr. Berenice Primas   Disorder of bone    unspecified   Essential hypertension 12/28/2019   Hypercholesteremia    migrated   Hyperlipemia    Long term (current) use of anticoagulants 12/28/2019   PAF   Low back pain    Major depression    in complete remission   Osteopenia    Osteoporosis    PAF (paroxysmal atrial fibrillation) ([place]) 12/28/2019   Post-menopausal    Senile purpura ([place])    Shingles    Sinusitis    UTI (urinary tract infection)    Vitamin D deficiency     Past Surgical History:  Procedure Laterality Date   NO PAST SURGERIES      There were no vitals filed for this visit.   Subjective Assessment - 11/20/20 1449     Subjective Patient reports that since having her ears cleaned out that since she feels that the hearing has been worse. Also reports she has an inner ear infection and since then the balance has been off as well. Played golf this morning, but felt off balance.    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    Currently in Pain? Yes    Pain Score 3     Pain Location Ear    Pain Orientation Left    Pain Descriptors / Indicators Aching;Nagging    Pain Type Acute pain    Pain Onset In the past 7 days             [facility] Adult PT Treatment/Exercise - 11/20/20 0001       Ambulation/Gait   Ambulation/Gait Yes    Ambulation/Gait Assistance 5: Supervision    Ambulation/Gait Assistance Details outdoor on unlevel surfaces with high level balance    Assistive device None    Gait Pattern Wide base of support    Ambulation Surface Level;Indoor;Unlevel;Outdoor;Grass      High Level Balance   High Level Balance Activities Backward walking;Head turns;Marching forwards    High Level Balance Comments Outdoors on grass unlevel surfaces: completed 2 x 50' of the following head turns, backwards walking, and marching forwards. Most challenge  noted with vertical hea dturns and backwards walking. Cues for step length with walking backwards.      Neuro Re-ed    Neuro Re-ed Details  Completed M-CTSIB: able to hold situation 1: 30 seconds, situation 2: 25 seconds, situation 3: 30 seconds, situation 4: < 5 seconds. Reviewed Medbridge HEP and Updated (See Below).              Balance Exercises - 11/20/20 0001       Balance Exercises: Standing   Standing Eyes Opened Narrow base of support (BOS);Foam/compliant surface   horizontal/vertical head turns x 10 reps. mild dizziness with head turns.   Tandem Stance Eyes open;2 reps;25 secs;Limitations    Tandem Stance Time alternating foot postion, no UE support on firm surface. CGA. then completed EO and horizontal head turns with 1/2 tandem stance x 10 reps.    SLS with Vectors Foam/compliant surface;Limitations    SLS  with Vectors Limitations standing on airex completed alternating toe taps to cones x 10 reps bilaterally, working on improved SLS.            Reviewed and Updated HEP:   Access Code: [email or web address] URL: [email or web address] Date: 11/20/2020 Prepared by: Baldomero Lamy  Exercises Standing Balance with Eyes Closed on Foam - 1 x daily - 5 x weekly - 1 sets - 3 reps - 30 seconds hold Standing with Head Rotation on Pillow - 1 x daily - 5 x weekly - 2 sets - 10 reps Standing with Head Nod on Pillow - 1 x daily - 5 x weekly - 2 sets - 10 reps Tandem Walking with Counter Support - 1 x daily - 5 x weekly - 1 sets - 3-4 reps Walking with Head Rotation - 1 x daily - 5 x weekly - 1 sets - 3-4 reps     PT Education - 11/20/20 1453     Education Details progress toward STGs; HEP Update    Person(s) Educated Patient    Methods Explanation;Demonstration;Handout    Comprehension Verbalized understanding;Returned demonstration              PT Short Term Goals - 11/20/20 1454       PT SHORT TERM GOAL #1   Title Patient will be independent with initial HEP for balance/vestibular (ALL STGs Due: 11/23/20)    Baseline no HEP established; reports independence with HEP    Time 3    Period Weeks    Status Achieved    Target Date 11/23/20      PT SHORT TERM GOAL #2   Title Patient will be able to hold situation 3 of M-CTSIB for >/= 30 seconds    Baseline 20 seconds; 30 seconds    Time 3    Period Weeks    Status Achieved      PT SHORT TERM GOAL #3   Title FGA to be assessed and LTG to be set    Baseline TBA; 20/30    Time 3    Period Weeks    Status Achieved               PT Long Term Goals - 11/20/20 1501       PT LONG TERM GOAL #1   Title Patient will be independent with final vestibular/balance HEP (All LTGs Due: 12/14/20)    Baseline no HEP established    Time 6    Period Weeks    Status New      PT LONG TERM GOAL #  2   Title Patient will demo </= 2 line  difference on DVA to indicative improved VOR function    Baseline 3 line difference    Time 6    Period Weeks    Status New      PT LONG TERM GOAL #3   Title Patient will improve FGA to >/= 24 to demo reduced fall risk    Baseline 20/30    Time 6    Period Weeks    Status New      PT LONG TERM GOAL #4   Title Patient will report </= 1/5 on all components of MSQ to demo improved tolerance for functional movement    Baseline see flowsheet    Time 6    Period Weeks    Status New      PT LONG TERM GOAL #5   Title Patient will be able to hold situation 4 of M-CTSIB for >/= 20 seconds    Baseline <5 seconds    Time 6    Period Weeks    Status New      PT LONG TERM GOAL #6   Title Patient will improve FOTO (DPS) to >/= 60%    Baseline 52%    Time 6    Period Weeks    Status New                   Plan - 11/20/20 1529     Clinical Impression Statement Today's skilled PT session focused on assesment of progress toward all STG. Patient able to meet all STGs demonstrating progress with PT services and improved balance. Continued session focused on high level balance outdoors on unlevel surfaces, complaint surfaces and SLS activities. Will continue to benefit from skilled PT services to progress toward LTGs.    Personal Factors and Comorbidities Comorbidity 3+    Comorbidities Arthritis, HLD, LBP, Depression, Osteoporosis, PAF, HTN    Examination-Activity Limitations Bed Mobility;Bend;Locomotion Level;Stairs;Caring for Others    Examination-Participation Restrictions Cleaning;Community Activity;Yard Work    Stability/Clinical Decision Making Stable/Uncomplicated    Rehab Potential Good    PT Frequency 2x / week    PT Duration 6 weeks    PT Treatment/Interventions ADLs/Self Care Home Management;Canalith Repostioning;Cryotherapy;Moist Heat;Gait training;DME Instruction;Functional mobility training;Stair training;Therapeutic exercise;Balance training;Therapeutic  activities;Neuromuscular re-education;Patient/family education;Manual techniques;Dry needling;Passive range of motion;Vestibular;Joint Manipulations    PT Next Visit Plan How was gait with head turns? Continue VOR progression (added standing on compliant surface with target remaining on plain background);  Balance on Incline. Rockerboard. Challenge Vestibular Input. Habituation to 180 turns/head movement.    Consulted and Agree with Plan of Care Patient             Patient will benefit from skilled therapeutic intervention in order to improve the following deficits and impairments:  Decreased balance, Decreased activity tolerance, Dizziness, Postural dysfunction, Difficulty walking  Visit Diagnosis: Unsteadiness on feet  Dizziness and giddiness  Other abnormalities of gait and mobility     Problem List Patient Active Problem List   Diagnosis Date Noted   Vertigo 10/26/2020   Gait abnormality 10/26/2020   Cerebral vascular disease 10/26/2020   Essential hypertension 12/28/2019   PAF (paroxysmal atrial fibrillation) ([place]) 12/28/2019   Long term (current) use of anticoagulants 12/28/2019   Mixed hyperlipidemia 03/25/2017   Anxiety state 03/25/2017   Vitamin deficiency 03/25/2017   Osteopenia 03/25/2017   Major depression 03/25/2017   Senile purpura ([place]) 03/25/2017   Allergic rhinitis 03/25/2017  Jones Bales, PT, DPT 11/20/2020, 3:30 PM  [facility] [address] [place], [place], [address] Phone: [phone]   Fax:  [phone]  Name: Carly Myers MRN: [id number] Date of Birth: 1945-05-10

## 2020-11-21 ENCOUNTER — Ambulatory Visit (INDEPENDENT_AMBULATORY_CARE_PROVIDER_SITE_OTHER): Payer: Medicare Other | Admitting: Family Medicine

## 2020-11-21 ENCOUNTER — Encounter: Payer: Self-pay | Admitting: Family Medicine

## 2020-11-21 VITALS — BP 140/84 | HR 62 | Temp 98.0°F | Ht 66.0 in | Wt 122.2 lb

## 2020-11-21 DIAGNOSIS — R42 Dizziness and giddiness: Secondary | ICD-10-CM

## 2020-11-21 DIAGNOSIS — H6122 Impacted cerumen, left ear: Secondary | ICD-10-CM

## 2020-11-21 DIAGNOSIS — F411 Generalized anxiety disorder: Secondary | ICD-10-CM

## 2020-11-21 DIAGNOSIS — I10 Essential (primary) hypertension: Secondary | ICD-10-CM | POA: Diagnosis not present

## 2020-11-21 DIAGNOSIS — I679 Cerebrovascular disease, unspecified: Secondary | ICD-10-CM

## 2020-11-21 DIAGNOSIS — H8120 Vestibular neuronitis, unspecified ear: Secondary | ICD-10-CM | POA: Diagnosis not present

## 2020-11-21 MED ORDER — DIAZEPAM 5 MG PO TABS: 2.5000 mg | ORAL_TABLET | Freq: Two times a day (BID) | ORAL | 1 refills | Status: DC | PRN

## 2020-11-21 NOTE — Patient Instructions (Addendum)
Please return in 2-4 weeks for ear recheck and irrigation.  Use Debrox wax softener drops twice a day for the next 2 weeks to soften the ear wax. This will help [place] remove it.   I have placed a referral to ENT to evaluate and treat you for your vertigo (possible vestibular neuritis).  Try using the valium to help with your symptoms. Do not take with ativan.   If you have any questions or concerns, please don't hesitate to send me a message via MyChart or call the office at [phone]. Thank you for visiting with [place] today! It's our pleasure caring for you.   Earwax Buildup, Adult The ears produce a substance called earwax that helps keep bacteria out of the ear and protects the skin in the ear canal. Occasionally, earwax can build up in the ear and cause discomfort or hearing loss. What are the causes? This condition is caused by a buildup of earwax. Ear canals are self-cleaning. Ear wax is made in the outer part of the ear canal and generally falls out in small amounts over time. When the self-cleaning mechanism is not working, earwax builds up and can cause decreased hearing and discomfort. Attempting to clean ears with cotton swabs can push the earwax deep into the ear canal and cause decreased hearing and pain. What increases the risk? This condition is more likely to develop in people who: Clean their ears often with cotton swabs. Pick at their ears. Use earplugs or in-ear headphones often, or wear hearing aids. The following factors may also make you more likely to develop this condition: Being female. Being of older age. Naturally producing more earwax. Having narrow ear canals. Having earwax that is overly thick or sticky. Having excess hair in the ear canal. Having eczema. Being dehydrated. What are the signs or symptoms? Symptoms of this condition include: Reduced or muffled hearing. A feeling of fullness in the ear or feeling that the ear is plugged. Fluid coming from the  ear. Ear pain or an itchy ear. Ringing in the ear. Coughing. Balance problems. An obvious piece of earwax that can be seen inside the ear canal. How is this diagnosed? This condition may be diagnosed based on: Your symptoms. Your medical history. An ear exam. During the exam, your health care provider will look into your ear with an instrument called an otoscope. You may have tests, including a hearing test. How is this treated? This condition may be treated by: Using ear drops to soften the earwax. Having the earwax removed by a health care provider. The health care provider may: Flush the ear with water. Use an instrument that has a loop on the end (curette). Use a suction device. Having surgery to remove the wax buildup. This may be done in severe cases. Follow these instructions at home:  Take over-the-counter and prescription medicines only as told by your health care provider. Do not put any objects, including cotton swabs, into your ear. You can clean the opening of your ear canal with a washcloth or facial tissue. Follow instructions from your health care provider about cleaning your ears. Do not overclean your ears. Drink enough fluid to keep your urine pale yellow. This will help to thin the earwax. Keep all follow-up visits as told. If earwax builds up in your ears often or if you use hearing aids, consider seeing your health care provider for routine, preventive ear cleanings. Ask your health care provider how often you should schedule your cleanings. If  you have hearing aids, clean them according to instructions from the manufacturer and your health care provider. Contact a health care provider if: You have ear pain. You develop a fever. You have pus or other fluid coming from your ear. You have hearing loss. You have ringing in your ears that does not go away. You feel like the room is spinning (vertigo). Your symptoms do not improve with treatment. Get help right  away if: You have bleeding from the affected ear. You have severe ear pain. Summary Earwax can build up in the ear and cause discomfort or hearing loss. The most common symptoms of this condition include reduced or muffled hearing, a feeling of fullness in the ear, or feeling that the ear is plugged. This condition may be diagnosed based on your symptoms, your medical history, and an ear exam. This condition may be treated by using ear drops to soften the earwax or by having the earwax removed by a health care provider. Do not put any objects, including cotton swabs, into your ear. You can clean the opening of your ear canal with a washcloth or facial tissue. This information is not intended to replace advice given to you by your health care provider. Make sure you discuss any questions you have with your health care provider. Document Revised: 05/31/2019 Document Reviewed: 05/31/2019 Elsevier Patient Education  [place].

## 2020-11-21 NOTE — Progress Notes (Signed)
Subjective  CC:  Chief Complaint  Patient presents with   Hypertension   Hyperlipidemia   Depression    HPI: Carly Myers is a 75 y.o. female who presents to the office today to address the problems listed above in the chief complaint. Hypertension f/u: Control is good . Pt reports she is doing well. taking medications as instructed, no medication side effects noted, no TIAs, no chest pain on exertion, no dyspnea on exertion, no swelling of ankles. She denies adverse effects from his BP medications. Compliance with medication is good.  Vertigo: ? Vestibular neuritis; neuro eval "can't r/o" a posterior circulation stroke. However, sxs persist. PT w/ some initial improvement in gait but now worse again since ear irrigation. No new sxs or paresis.  Cerumen impaction: can't hear out of left ear. Reviewed recent note.   Assessment  1. Vertigo   2. Cerebral vascular disease   3. Essential hypertension   4. Anxiety state   5. Impacted cerumen, left ear   6. Vestibular neuronitis, unspecified laterality      Plan   Hypertension f/u: BP control is fairly well controlled. Metoprol xl 12.5 daily with low heart rate. Will monitor may need additional med vertigo f/u: trial of valium, refer to ent. CVA w/u was negative: MRI< echo, carotids.  MRI did show microvascular changes.  Impacted cerumen, hard: start debrox and recheck for irrigation in 2-4 weeks.   Education regarding management of these chronic disease states was given. Management strategies discussed on successive visits include dietary and exercise recommendations, goals of achieving and maintaining IBW, and lifestyle modifications aiming for adequate sleep and minimizing stressors.   Follow up: 2-4 weeks for ear  Orders Placed This Encounter  Procedures   Ambulatory referral to ENT   Meds ordered this encounter  Medications   diazepam (VALIUM) 5 MG tablet    Sig: Take 0.5-1 tablets (2.5-5 mg total) by mouth 2 (two) times  daily as needed (vertigo).    Dispense:  30 tablet    Refill:  1      BP Readings from Last 3 Encounters:  11/21/20 140/84  11/14/20 130/70  10/26/20 (!) 145/73   Wt Readings from Last 3 Encounters:  11/21/20 122 lb 3.2 oz (55.4 kg)  11/14/20 122 lb 4 oz (55.5 kg)  10/26/20 122 lb (55.3 kg)    Lab Results  Component Value Date   CHOL 140 05/16/2020   CHOL 143 03/28/2019   CHOL 158 03/29/2018   Lab Results  Component Value Date   HDL 58.90 05/16/2020   HDL 63 03/28/2019   HDL 61 03/29/2018   Lab Results  Component Value Date   LDLCALC 70 05/16/2020   LDLCALC 66 03/28/2019   LDLCALC 81 03/29/2018   Lab Results  Component Value Date   TRIG 57.0 05/16/2020   TRIG 69 03/28/2019   TRIG 80 03/29/2018   Lab Results  Component Value Date   CHOLHDL 2 05/16/2020   CHOLHDL 2.3 03/28/2019   CHOLHDL 2.6 03/29/2018   No results found for: LDLDIRECT Lab Results  Component Value Date   CREATININE 0.78 05/16/2020   BUN 16 05/16/2020   NA 137 05/16/2020   K 4.0 05/16/2020   CL 103 05/16/2020   CO2 27 05/16/2020    The ASCVD Risk score (Arnett DK, et al., 2019) failed to calculate for the following reasons:   Unable to determine if patient is Non-Hispanic African American  I reviewed the patients updated PMH, FH, and  SocHx.    Patient Active Problem List   Diagnosis Date Noted   Vertigo 10/26/2020   Gait abnormality 10/26/2020   Cerebral vascular disease 10/26/2020   Essential hypertension 12/28/2019   PAF (paroxysmal atrial fibrillation) ([place]) 12/28/2019   Long term (current) use of anticoagulants 12/28/2019   Mixed hyperlipidemia 03/25/2017   Anxiety state 03/25/2017   Vitamin deficiency 03/25/2017   Osteopenia 03/25/2017   Major depression 03/25/2017   Senile purpura ([place]) 03/25/2017   Allergic rhinitis 03/25/2017    Allergies: Bee venom  Social History: Patient  reports that she quit smoking about 12 years ago. Her smoking use included cigarettes.  She has never used smokeless tobacco. She reports current alcohol use. She reports that she does not use drugs.  Current Meds  Medication Sig   Calcium Carb-Cholecalciferol (CALCIUM + D3) 600-200 MG-UNIT TABS Take 1 tablet by mouth daily.   Cholecalciferol (VITAMIN D) 2000 units CAPS Take 2,000 Units by mouth daily.   Cyanocobalamin (B-12 PO) Take by mouth.   diazepam (VALIUM) 5 MG tablet Take 0.5-1 tablets (2.5-5 mg total) by mouth 2 (two) times daily as needed (vertigo).   EPINEPHrine 0.3 mg/0.3 mL IJ SOAJ injection Inject 0.3 mg into the muscle once.   Krill Oil 1000 MG CAPS Take 1 capsule by mouth daily.   Magnesium Hydroxide (MAGNESIA PO) Take 1 tablet by mouth daily.   metoprolol succinate (TOPROL-XL) 25 MG 24 hr tablet TAKE ONE-HALF (1/2) TABLET TWICE A DAY   rivaroxaban (XARELTO) 20 MG TABS tablet TAKE 1 TABLET DAILY WITH SUPPER   rosuvastatin (CRESTOR) 40 MG tablet TAKE 1 TABLET DAILY   [DISCONTINUED] LORazepam (ATIVAN) 0.5 MG tablet Take 1 tablet (0.5 mg total) by mouth at bedtime as needed for sleep.    Review of Systems: Cardiovascular: negative for chest pain, palpitations, leg swelling, orthopnea Respiratory: negative for SOB, wheezing or persistent cough Gastrointestinal: negative for abdominal pain Genitourinary: negative for dysuria or gross hematuria  Objective  Vitals: BP 140/84   Pulse 62   Temp 98 F (36.7 C) (Temporal)   Ht 5\' 6"  (1.676 m)   Wt 122 lb 3.2 oz (55.4 kg)   SpO2 99%   BMI 19.72 kg/m  General: no acute distress  Psych:  Alert and oriented, normal mood and affect HEENT:  Normocephalic, atraumatic, supple neck , left cerumen impaction present Cardiovascular:  RRR without murmur. no edema Respiratory:  Good breath sounds bilaterally, CTAB with normal respiratory effort Skin:  Warm, no rashes Neurologic:   Mental status is normal, unsteady gait [place] side effects, risks, benefits, and alternatives for medications and treatment plan prescribed  today were discussed, and the patient expressed understanding of the given instructions. Patient is instructed to call or message via MyChart if he/she has any questions or concerns regarding our treatment plan. No barriers to understanding were identified. We discussed Red Flag symptoms and signs in detail. Patient expressed understanding regarding what to do in case of urgent or emergency type symptoms.  Medication list was reconciled, printed and provided to the patient in AVS. Patient instructions and summary information was reviewed with the patient as documented in the AVS. This note was prepared with assistance of Dragon voice recognition software. Occasional wrong-word or sound-a-like substitutions may have occurred due to the inherent limitations of voice recognition software  This visit occurred during the SARS-CoV-2 public health emergency.  Safety protocols were in place, including screening questions prior to the visit, additional usage of staff PPE, and extensive cleaning  of exam room while observing appropriate contact time as indicated for disinfecting solutions.

## 2020-11-22 ENCOUNTER — Other Ambulatory Visit: Payer: Self-pay

## 2020-11-22 ENCOUNTER — Ambulatory Visit: Payer: Medicare Other | Admitting: Physical Therapy

## 2020-11-22 DIAGNOSIS — R42 Dizziness and giddiness: Secondary | ICD-10-CM

## 2020-11-22 DIAGNOSIS — R2681 Unsteadiness on feet: Secondary | ICD-10-CM | POA: Diagnosis not present

## 2020-11-22 DIAGNOSIS — R2689 Other abnormalities of gait and mobility: Secondary | ICD-10-CM

## 2020-11-22 NOTE — Therapy (Signed)
[facility] [address] [place], [place], [address] Phone: [phone]   Fax:  [phone]  Physical Therapy Treatment  Patient Details  Name: Carly Myers MRN: [id number] Date of Birth: 1945-07-01 Referring Provider (PT): Billey Chang, MD   Encounter Date: 11/22/2020   PT End of Session - 11/22/20 1410     Visit Number 7    Number of Visits 13    Date for PT Re-Evaluation 12/14/20    Authorization Type Medicare A + B; BCBS Supplemental    Progress Note Due on Visit 10    PT Start Time 6160    PT Stop Time 1448    PT Time Calculation (min) 46 min    Activity Tolerance Patient tolerated treatment well    Behavior During Therapy [facility] for tasks assessed/performed;Anxious             Past Medical History:  Diagnosis Date   Allergic rhinitis    Anaphylactic reaction to bee sting    Anxiety state    unspecified   Arthritis    Ortho Dr. Berenice Primas   Disorder of bone    unspecified   Essential hypertension 12/28/2019   Hypercholesteremia    migrated   Hyperlipemia    Long term (current) use of anticoagulants 12/28/2019   PAF   Low back pain    Major depression    in complete remission   Osteopenia    Osteoporosis    PAF (paroxysmal atrial fibrillation) ([place]) 12/28/2019   Post-menopausal    Senile purpura ([place])    Shingles    Sinusitis    UTI (urinary tract infection)    Vitamin D deficiency     Past Surgical History:  Procedure Laterality Date   NO PAST SURGERIES      There were no vitals filed for this visit.   Subjective Assessment - 11/22/20 1411     Subjective Goes to see ENT in November - hopes he has "the cure" for this.  Still feels very off balance and feels like she isn't making progress.    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    Currently in Pain? Yes    Pain Onset In the past 7 days                 Vestibular Treatment/Exercise - 11/22/20 1413       Vestibular Treatment/Exercise   Vestibular Treatment Provided Gaze      X1 Viewing Horizontal   Foot Position feet apart, solid surface    Reps 3    Comments 30 seconds > 60 seconds with focus on static standing stability and moving head separate from body      X1 Viewing Vertical   Foot Position feet apart, solid surface    Reps 3    Comments 30 seconds > 60 seconds.                Balance Exercises - 11/22/20 1430       Balance Exercises: Standing   Standing Eyes Opened Narrow base of support (BOS);Head turns;Solid surface;5 reps;Limitations    Standing Eyes Opened Limitations feet together on solid surface holding balance during alternating UE raises x 5 each side and then reaching across midline to tap the  wall with head turns x 10.  Second set added in looking up with reaching up.  Then performed same exercise, 2 sets x 10 reps with EC    Standing Eyes Closed Narrow base of support (BOS);Head turns;Solid surface;5 reps;Limitations    Standing Eyes Closed Limitations see above    SLS Eyes open;Solid surface;Intermittent upper extremity support;10 secs;5 reps                PT Education - 11/22/20 1551     Education Details Educated pt on purpose of ENT visit for more objective testing but also discussed that vestibular rehab is the main treatment for vestibular impairments but the testing may give her more information about overall vestibular function; changes to HEP    Person(s) Educated Patient    Methods Explanation;Demonstration;Handout    Comprehension Verbalized understanding;Returned demonstration              PT Short Term Goals - 11/20/20 1454       PT SHORT TERM GOAL #1   Title Patient will be independent with initial HEP for  balance/vestibular (ALL STGs Due: 11/23/20)    Baseline no HEP established; reports independence with HEP    Time 3    Period Weeks    Status Achieved    Target Date 11/23/20      PT SHORT TERM GOAL #2   Title Patient will be able to hold situation 3 of M-CTSIB for >/= 30 seconds    Baseline 20 seconds; 30 seconds    Time 3    Period Weeks    Status Achieved      PT SHORT TERM GOAL #3   Title FGA to be assessed and LTG to be set    Baseline TBA; 20/30    Time 3    Period Weeks    Status Achieved               PT Long Term Goals - 11/20/20 1501       PT LONG TERM GOAL #1   Title Patient will be independent with final vestibular/balance HEP (All LTGs Due: 12/14/20)    Baseline no HEP established    Time 6    Period Weeks    Status New      PT LONG TERM GOAL #2   Title Patient will demo </= 2 line difference on DVA to indicative improved VOR function    Baseline 3 line difference    Time 6    Period Weeks    Status New      PT LONG TERM GOAL #3   Title Patient will improve FGA to >/= 24 to demo reduced fall risk    Baseline 20/30    Time 6    Period Weeks    Status New      PT LONG TERM GOAL #4   Title Patient will report </= 1/5 on all components of MSQ to demo improved tolerance for functional movement    Baseline see flowsheet    Time 6    Period Weeks    Status New      PT LONG TERM GOAL #5   Title Patient will be able to hold situation 4 of M-CTSIB for >/= 20 seconds    Baseline <5 seconds    Time 6    Period Weeks    Status New      PT LONG TERM GOAL #6   Title Patient will improve FOTO (DPS) to >/=  60%    Baseline 52%    Time 6    Period Weeks    Status New                   Plan - 11/22/20 1453     Clinical Impression Statement Continued to provide pt with education regarding purpose of visit with ENT, role of vestibular system and purpose of exercises.  Continued to update and progress VOR; pt noted to have greater difficulty  maintaining balance with static stand and less difficulty when performing more dynamic movement.  Continued to provide pt with static standing balance training wtih narrow BOS, EO and EC.  Added SLS to HEP.  Will continue to address and progress towards LTG.    Personal Factors and Comorbidities Comorbidity 3+    Comorbidities Arthritis, HLD, LBP, Depression, Osteoporosis, PAF, HTN    Examination-Activity Limitations Bed Mobility;Bend;Locomotion Level;Stairs;Caring for Others    Examination-Participation Restrictions Cleaning;Community Activity;Yard Work    Stability/Clinical Decision Making Stable/Uncomplicated    Rehab Potential Good    PT Frequency 2x / week    PT Duration 6 weeks    PT Treatment/Interventions ADLs/Self Care Home Management;Canalith Repostioning;Cryotherapy;Moist Heat;Gait training;DME Instruction;Functional mobility training;Stair training;Therapeutic exercise;Balance training;Therapeutic activities;Neuromuscular re-education;Patient/family education;Manual techniques;Dry needling;Passive range of motion;Vestibular;Joint Manipulations    PT Next Visit Plan I increased time on VOR but had to go back to solid surface - progress back to compliant.  She has a much harder time with static balance - keep focusing on SLS and static balance on variety of surfaces.  Balance on Incline. Rockerboard. Challenge Vestibular Input. Habituation to 180 turns/head movement.    Consulted and Agree with Plan of Care Patient             Patient will benefit from skilled therapeutic intervention in order to improve the following deficits and impairments:  Decreased balance, Decreased activity tolerance, Dizziness, Postural dysfunction, Difficulty walking  Visit Diagnosis: Unsteadiness on feet  Dizziness and giddiness  Other abnormalities of gait and mobility     Problem List Patient Active Problem List   Diagnosis Date Noted   Vertigo 10/26/2020   Gait abnormality 10/26/2020    Cerebral vascular disease 10/26/2020   Essential hypertension 12/28/2019   PAF (paroxysmal atrial fibrillation) ([place]) 12/28/2019   Long term (current) use of anticoagulants 12/28/2019   Mixed hyperlipidemia 03/25/2017   Anxiety state 03/25/2017   Vitamin deficiency 03/25/2017   Osteopenia 03/25/2017   Major depression 03/25/2017   Senile purpura ([place]) 03/25/2017   Allergic rhinitis 03/25/2017    Rico Junker, PT, DPT 11/22/20    3:54 PM    [facility] [address] [place], [place], [address] Phone: [phone]   Fax:  [phone]  Name: Elaiza Shoberg MRN: [id number] Date of Birth: [birth year]

## 2020-11-22 NOTE — Patient Instructions (Addendum)
Gaze Stabilization: Standing Feet Apart    Feet shoulder width apart, keeping eyes on target on wall 3 feet away, tilt head down 15-30 and move head side to side for 60 seconds. Repeat while moving head up and down for 60 seconds. Do 2-3 sessions per day.  Access Code: [email or web address] URL: [email or web address] Date: 11/22/2020 Prepared by: Misty Stanley  Exercises Standing Balance with Eyes Closed on Foam - 1 x daily - 5 x weekly - 1 sets - 3 reps - 30 seconds hold Standing with Head Rotation on Pillow - 1 x daily - 5 x weekly - 2 sets - 10 reps Standing with Head Nod on Pillow - 1 x daily - 5 x weekly - 2 sets - 10 reps Tandem Walking with Counter Support - 1 x daily - 5 x weekly - 1 sets - 3-4 reps Walking with Head Rotation - 1 x daily - 5 x weekly - 1 sets - 3-4 reps Single Leg Stance with Support - 1 x daily - 7 x weekly - 3 sets - 10 second hold

## 2020-11-27 ENCOUNTER — Ambulatory Visit: Payer: Medicare Other

## 2020-11-29 ENCOUNTER — Other Ambulatory Visit: Payer: Self-pay

## 2020-11-29 ENCOUNTER — Ambulatory Visit: Payer: Medicare Other | Attending: Family Medicine | Admitting: Physical Therapy

## 2020-11-29 DIAGNOSIS — R42 Dizziness and giddiness: Secondary | ICD-10-CM | POA: Diagnosis not present

## 2020-11-29 DIAGNOSIS — R2689 Other abnormalities of gait and mobility: Secondary | ICD-10-CM | POA: Insufficient documentation

## 2020-11-29 DIAGNOSIS — R2681 Unsteadiness on feet: Secondary | ICD-10-CM | POA: Diagnosis not present

## 2020-11-30 NOTE — Therapy (Signed)
[facility] [address] [place], [place], [address] Phone: [phone]   Fax:  [phone]  Physical Therapy Treatment  Patient Details  Name: Carly Myers MRN: [id number] Date of Birth: 03-25-1945 Referring Provider (PT): Billey Chang, MD   Encounter Date: 11/29/2020   PT End of Session - 11/30/20 0853     Visit Number 8    Number of Visits 13    Date for PT Re-Evaluation 12/14/20    Authorization Type Medicare A + B; BCBS Supplemental    Progress Note Due on Visit 10    PT Start Time 5284    PT Stop Time 1528    PT Time Calculation (min) 43 min    Equipment Utilized During Treatment Gait belt    Activity Tolerance Patient tolerated treatment well    Behavior During Therapy WFL for tasks assessed/performed             Past Medical History:  Diagnosis Date   Allergic rhinitis    Anaphylactic reaction to bee sting    Anxiety state    unspecified   Arthritis    Ortho Dr. Berenice Primas   Disorder of bone    unspecified   Essential hypertension 12/28/2019   Hypercholesteremia    migrated   Hyperlipemia    Long term (current) use of anticoagulants 12/28/2019   PAF   Low back pain    Major depression    in complete remission   Osteopenia    Osteoporosis    PAF (paroxysmal atrial fibrillation) ([place]) 12/28/2019   Post-menopausal    Senile purpura ([place])    Shingles    Sinusitis    UTI (urinary tract infection)    Vitamin D deficiency     Past Surgical History:  Procedure Laterality Date   NO PAST SURGERIES      There were no vitals filed for this visit.   Subjective Assessment - 11/29/20 1449     Subjective Pt states her hearing has gotten worse (Lt ear) since they cleaned her ears out; has ENT appt in Nov.    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    Currently in Pain? No/denies    Pain Onset In the past 7 days                                    Balance Exercises - 11/30/20 0001       Balance Exercises: Standing   Standing Eyes Opened Wide (BOA);Head turns;Solid surface;Other reps (comment)   horizontal and vertical head turns - standing on incline and then on decline; 5 reps EO each direction, 5 reps EC each direction on incline/decline   Standing Eyes Closed Wide (BOA);Head turns;Solid surface;Other reps (comment);Foam/compliant surface   on incline and decline on ramp- performed horizontal & vertical head turns with EC; pt performed standing with feet PARTIALLY together on 2 pillows in corner - UE support on chair in front   Stepping Strategy Anterior;Other reps (comment);5 reps;Posterior   pt performed stepping forward/backward on incline and then on decline with CGA to min assist with EO 5 reps each LE   Rockerboard Anterior/posterior;Head turns;EO;EC;10 reps   10 reps with EO;  10 reps with EC with minimal UE support on // bars   Gait with Head Turns Forward;2 reps   35' x 4 reps; 2 reps with horizontal head turns and 2 reps with vertical head turns - unsteadiness occurred with both but pt able to recover independently - cues to perform head turns slowly for increased ease and for less LOB   Marching Foam/compliant surface;Head turns;Static   performed standing on 2 pillows in corner           NeuroRe-ed:  Pt stood on inverted Bosu inside // bars to have UE support- performed weight shifts Anterior/posteriorly and then laterally 10 reps each Performed 10 squats on Bosu with bil UE support - legs tremored significantly initially, but less tremors noted As exercises progressed on Bosu      PT Education - 11/30/20 0851     Education Details instructed pt to add standing with feet closer together (but not completely  together) with EC with UE support on chair - add this ex. to HEP - no head turns at this time- try to hold for 10- 15 secs    Person(s) Educated Patient    Methods Explanation    Comprehension Verbalized understanding;Returned demonstration              PT Short Term Goals - 11/30/20 0900       PT SHORT TERM GOAL #1   Title Patient will be independent with initial HEP for balance/vestibular (ALL STGs Due: 11/23/20)    Baseline no HEP established; reports independence with HEP    Time 3    Period Weeks    Status Achieved    Target Date 11/23/20      PT SHORT TERM GOAL #2   Title Patient will be able to hold situation 3 of M-CTSIB for >/= 30 seconds    Baseline 20 seconds; 30 seconds    Time 3    Period Weeks    Status Achieved      PT SHORT TERM GOAL #3   Title FGA to be assessed and LTG to be set    Baseline TBA; 20/30    Time 3    Period Weeks    Status Achieved               PT Long Term Goals - 11/30/20 0901       PT LONG TERM GOAL #1   Title Patient will be independent with final vestibular/balance HEP (All LTGs Due: 12/14/20)    Baseline no HEP established    Time 6    Period Weeks    Status New      PT LONG TERM GOAL #2   Title Patient will demo </= 2 line difference on DVA to indicative improved VOR function    Baseline 3 line difference    Time 6    Period Weeks    Status New      PT LONG TERM GOAL #3   Title Patient will improve FGA to >/= 24 to demo reduced fall risk    Baseline 20/30    Time 6    Period Weeks    Status New      PT LONG TERM GOAL #4   Title Patient will report </= 1/5 on all components of MSQ to demo improved tolerance for functional movement    Baseline see flowsheet    Time 6    Period Weeks    Status New  PT LONG TERM GOAL #5   Title Patient will be able to hold situation 4 of M-CTSIB for >/= 20 seconds    Baseline <5 seconds    Time 6    Period Weeks    Status New      PT LONG TERM GOAL #6   Title  Patient will improve FOTO (DPS) to >/= 60%    Baseline 52%    Time 6    Period Weeks    Status New                   Plan - 11/29/20 1453     Clinical Impression Statement Pt demonstrates slight improvement in balance with more steadiness noted with static standing and with maintaining balance on compliant surfaces, however, pt reports she does not feel that she is making progress.  LE's tremored when initially standing on inverted BOSU inside // bars but less tremors noted with practice, repetition and UE support.  Pt continues to have unsteadiness with amb. with head turns with > unsteadiness noted with horizontal than with vertical head turns.  Cont with POC.    Personal Factors and Comorbidities Comorbidity 3+    Comorbidities Arthritis, HLD, LBP, Depression, Osteoporosis, PAF, HTN    Examination-Activity Limitations Bed Mobility;Bend;Locomotion Level;Stairs;Caring for Others    Examination-Participation Restrictions Cleaning;Community Activity;Yard Work    Stability/Clinical Decision Making Stable/Uncomplicated    Rehab Potential Good    PT Frequency 2x / week    PT Duration 6 weeks    PT Treatment/Interventions ADLs/Self Care Home Management;Canalith Repostioning;Cryotherapy;Moist Heat;Gait training;DME Instruction;Functional mobility training;Stair training;Therapeutic exercise;Balance training;Therapeutic activities;Neuromuscular re-education;Patient/family education;Manual techniques;Dry needling;Passive range of motion;Vestibular;Joint Manipulations    PT Next Visit Plan Cont dynamic gait and balance with vestibular input required - check if pt tried standing on pillows with feet partially together with EC at home (was instructed to have UE support with this exercise at this time for safety)    Consulted and Agree with Plan of Care Patient             Patient will benefit from skilled therapeutic intervention in order to improve the following deficits and impairments:   Decreased balance, Decreased activity tolerance, Dizziness, Postural dysfunction, Difficulty walking  Visit Diagnosis: Unsteadiness on feet  Other abnormalities of gait and mobility     Problem List Patient Active Problem List   Diagnosis Date Noted   Vertigo 10/26/2020   Gait abnormality 10/26/2020   Cerebral vascular disease 10/26/2020   Essential hypertension 12/28/2019   PAF (paroxysmal atrial fibrillation) ([place]) 12/28/2019   Long term (current) use of anticoagulants 12/28/2019   Mixed hyperlipidemia 03/25/2017   Anxiety state 03/25/2017   Vitamin deficiency 03/25/2017   Osteopenia 03/25/2017   Major depression 03/25/2017   Senile purpura ([place]) 03/25/2017   Allergic rhinitis 03/25/2017    Alda Lea, PT 11/30/2020, 9:02 AM  [facility] [address] [place], [place], [address] Phone: [phone]   Fax:  [phone]  Name: Carly Myers MRN: [id number] Date of Birth: 1946-02-14

## 2020-12-04 ENCOUNTER — Ambulatory Visit: Payer: Medicare Other

## 2020-12-06 ENCOUNTER — Other Ambulatory Visit: Payer: Self-pay

## 2020-12-06 ENCOUNTER — Ambulatory Visit: Payer: Medicare Other | Admitting: Physical Therapy

## 2020-12-06 DIAGNOSIS — R2689 Other abnormalities of gait and mobility: Secondary | ICD-10-CM | POA: Diagnosis not present

## 2020-12-06 DIAGNOSIS — R2681 Unsteadiness on feet: Secondary | ICD-10-CM

## 2020-12-06 DIAGNOSIS — R42 Dizziness and giddiness: Secondary | ICD-10-CM

## 2020-12-06 NOTE — Therapy (Signed)
[facility] [address] [place], [place], [address] Phone: [phone]   Fax:  [phone]  Physical Therapy Treatment  Patient Details  Name: Carly Myers MRN: [id number] Date of Birth: 25-Jun-1945 Referring Provider (PT): Billey Chang, MD   Encounter Date: 12/06/2020   PT End of Session - 12/06/20 1630     Visit Number 9    Number of Visits 13    Date for PT Re-Evaluation 12/14/20    Authorization Type Medicare A + B; BCBS Supplemental    Progress Note Due on Visit 10    PT Start Time 1402    PT Stop Time 1446    PT Time Calculation (min) 44 min    Equipment Utilized During Treatment Other (comment)   harness vest used on SOT   Activity Tolerance Patient tolerated treatment well    Behavior During Therapy [facility] for tasks assessed/performed             Past Medical History:  Diagnosis Date   Allergic rhinitis    Anaphylactic reaction to bee sting    Anxiety state    unspecified   Arthritis    Ortho Dr. Berenice Primas   Disorder of bone    unspecified   Essential hypertension 12/28/2019   Hypercholesteremia    migrated   Hyperlipemia    Long term (current) use of anticoagulants 12/28/2019   PAF   Low back pain    Major depression    in complete remission   Osteopenia    Osteoporosis    PAF (paroxysmal atrial fibrillation) ([place]) 12/28/2019   Post-menopausal    Senile purpura ([place])    Shingles    Sinusitis    UTI (urinary tract infection)    Vitamin D deficiency     Past Surgical History:  Procedure Laterality Date   NO PAST SURGERIES      There were no vitals filed for this visit.   Subjective Assessment - 12/06/20 1359     Subjective Pt states she still has decreased hearing in her left ear; states no change in status since last week.  Has appt with Dr. Benjamine Mola in November. has appt with Dr. 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    Currently in Pain? No/denies    Pain Onset --               Sensory Organization Test - composite score 42/100 (N= 62/100)  Somatosensory and visual inputs WNL's; Vestibular input 2/100 with N= 50/100  Condition 1 - all 3 trials WNL's Condition 2 - all 3 trials WNL's Condition 3 - trials 1 & 2 below N:  trial 3 WNL's Condition 4 - all 3 trials WNL's Condition 5 - FALL on all 3 trials Condition 6 - FALL on all 3 trials  COG - slightly biased to Rt side on conditions 5 & 6                 [facility] Adult PT Treatment/Exercise - 12/06/20 1423       High Level Balance   High Level Balance Activities Sudden stops;Head turns;Turns   2 reps turning to Rt side/Lt side  Balance Exercises - 12/06/20 0001       Balance Exercises: Standing   Standing Eyes Opened Head turns;Solid surface;Other reps (comment);Narrow base of support (BOS)   horizontal and vertical head turns - standing on incline and then on decline; 5 reps EO each direction, 5 reps EC each direction on incline/decline   Standing Eyes Closed Wide (BOA);Head turns;Solid surface;Other reps (comment);Foam/compliant surface   on incline and decline on ramp- performed horizontal & vertical head turns with EC; pt performed standing with feet PARTIALLY together on 2 pillows in corner - UE support on chair in front   Rockerboard Anterior/posterior;Head turns;EO;EC;10 reps   10 reps with EO; 20 reps with EC with min UE support on // bars   Gait with Head Turns Forward;1 rep   25'   Marching Foam/compliant surface;Head turns;Static   performed standing on 2 pillows in corner   Other Standing Exercises Pt performed trunk rotations standing on 2 pillows in corner - straight across with EO and  then with EC 5 reps; diagonals "X" patterns - with EO 5 reps each; did not attempt with EC    Other Standing Exercises Comments Pt performed stepping down to floor alternating feet standing on 2 pillows - 5 reps without head turns; 5 reps with head turns with CGA                  PT Short Term Goals - 12/06/20 1636       PT SHORT TERM GOAL #1   Title Patient will be independent with initial HEP for balance/vestibular (ALL STGs Due: 11/23/20)    Baseline no HEP established; reports independence with HEP    Time 3    Period Weeks    Status Achieved    Target Date 11/23/20      PT SHORT TERM GOAL #2   Title Patient will be able to hold situation 3 of M-CTSIB for >/= 30 seconds    Baseline 20 seconds; 30 seconds    Time 3    Period Weeks    Status Achieved      PT SHORT TERM GOAL #3   Title FGA to be assessed and LTG to be set    Baseline TBA; 20/30    Time 3    Period Weeks    Status Achieved               PT Long Term Goals - 12/06/20 1636       PT LONG TERM GOAL #1   Title Patient will be independent with final vestibular/balance HEP (All LTGs Due: 12/14/20)    Baseline no HEP established    Time 6    Period Weeks    Status New      PT LONG TERM GOAL #2   Title Patient will demo </= 2 line difference on DVA to indicative improved VOR function    Baseline 3 line difference    Time 6    Period Weeks    Status New      PT LONG TERM GOAL #3   Title Patient will improve FGA to >/= 24 to demo reduced fall risk    Baseline 20/30    Time 6    Period Weeks    Status New      PT LONG TERM GOAL #4   Title Patient will report </= 1/5 on all components of MSQ to demo improved tolerance for functional movement    Baseline see flowsheet  Time 6    Period Weeks    Status New      PT LONG TERM GOAL #5   Title Patient will be able to hold situation 4 of M-CTSIB for >/= 20 seconds    Baseline <5 seconds    Time 6    Period Weeks    Status New      PT  LONG TERM GOAL #6   Title Patient will improve FOTO (DPS) to >/= 60%    Baseline 52%    Time 6    Period Weeks    Status New                   Plan - 12/06/20 1631     Clinical Impression Statement Pt has very minimal vestibular input in maintaining balance per SOT (SOT was performed in today's session per pt's request);  composite score 42/100 with N= 62/100.  Somatosensory and visual inputs WNL's.  Pt has difficulty maintaining balance with EC with standing on compliant surface, indicative of vestibular hypofunction.  Cont with POC.    Personal Factors and Comorbidities Comorbidity 3+    Comorbidities Arthritis, HLD, LBP, Depression, Osteoporosis, PAF, HTN    Examination-Activity Limitations Bed Mobility;Bend;Locomotion Level;Stairs;Caring for Others    Examination-Participation Restrictions Cleaning;Community Activity;Yard Work    Stability/Clinical Decision Making Stable/Uncomplicated    Rehab Potential Good    PT Frequency 2x / week    PT Duration 6 weeks    PT Treatment/Interventions ADLs/Self Care Home Management;Canalith Repostioning;Cryotherapy;Moist Heat;Gait training;DME Instruction;Functional mobility training;Stair training;Therapeutic exercise;Balance training;Therapeutic activities;Neuromuscular re-education;Patient/family education;Manual techniques;Dry needling;Passive range of motion;Vestibular;Joint Manipulations    PT Next Visit Plan Cont dynamic gait and balance with vestibular input required - pt has 1 week of appts scheduled - determine D/C vs. renewal    Consulted and Agree with Plan of Care Patient             Patient will benefit from skilled therapeutic intervention in order to improve the following deficits and impairments:  Decreased balance, Decreased activity tolerance, Dizziness, Postural dysfunction, Difficulty walking  Visit Diagnosis: Unsteadiness on feet  Dizziness and giddiness  Other abnormalities of gait and  mobility     Problem List Patient Active Problem List   Diagnosis Date Noted   Vertigo 10/26/2020   Gait abnormality 10/26/2020   Cerebral vascular disease 10/26/2020   Essential hypertension 12/28/2019   PAF (paroxysmal atrial fibrillation) ([place]) 12/28/2019   Long term (current) use of anticoagulants 12/28/2019   Mixed hyperlipidemia 03/25/2017   Anxiety state 03/25/2017   Vitamin deficiency 03/25/2017   Osteopenia 03/25/2017   Major depression 03/25/2017   Senile purpura ([place]) 03/25/2017   Allergic rhinitis 03/25/2017    Alda Lea, PT 12/06/2020, 4:38 PM  [facility] [address] [place], [place], [address] Phone: [phone]   Fax:  [phone]  Name: Carly Myers MRN: [id number] Date of Birth: 05/21/1945

## 2020-12-11 ENCOUNTER — Ambulatory Visit: Payer: Medicare Other

## 2020-12-12 ENCOUNTER — Encounter: Payer: Self-pay | Admitting: Family Medicine

## 2020-12-12 ENCOUNTER — Ambulatory Visit (INDEPENDENT_AMBULATORY_CARE_PROVIDER_SITE_OTHER): Payer: Medicare Other | Admitting: Family Medicine

## 2020-12-12 ENCOUNTER — Other Ambulatory Visit: Payer: Self-pay

## 2020-12-12 VITALS — BP 128/88 | HR 58 | Temp 98.6°F | Ht 66.0 in | Wt 122.8 lb

## 2020-12-12 DIAGNOSIS — H6122 Impacted cerumen, left ear: Secondary | ICD-10-CM

## 2020-12-12 DIAGNOSIS — R42 Dizziness and giddiness: Secondary | ICD-10-CM | POA: Diagnosis not present

## 2020-12-12 NOTE — Progress Notes (Signed)
Subjective  CC:  Chief Complaint  Patient presents with   Ear Fullness    Left ear, still clogged   Dizziness    HPI: Carly Myers is a 75 y.o. female who presents to the office today to address the problems listed above in the chief complaint. F/u left cerumen impaction. Has been using debrox: "feels worse" and "can't hear".  Vertigo: to see Dr. Benjamine Mola Nov 15; will get vestibular testing and f/u on cerumen? ? Vestibular neuritis vs CVA vs menieres etc ...   Assessment  1. Vertigo   2. Impacted cerumen, left ear      Plan  vertigo:  for further eval with ent, no change but no worsening. Ongoing PT Cerumen impaction cleared with irrigation  Follow up: march 2022 for cpe  Visit date not found  No orders of the defined types were placed in this encounter.  No orders of the defined types were placed in this encounter.     I reviewed the patients updated PMH, FH, and SocHx.    Patient Active Problem List   Diagnosis Date Noted   Vertigo 10/26/2020   Gait abnormality 10/26/2020   Cerebral vascular disease 10/26/2020   Essential hypertension 12/28/2019   PAF (paroxysmal atrial fibrillation) ([place]) 12/28/2019   Long term (current) use of anticoagulants 12/28/2019   Mixed hyperlipidemia 03/25/2017   Anxiety state 03/25/2017   Vitamin deficiency 03/25/2017   Osteopenia 03/25/2017   Major depression 03/25/2017   Senile purpura ([place]) 03/25/2017   Allergic rhinitis 03/25/2017   Current Meds  Medication Sig   Calcium Carb-Cholecalciferol (CALCIUM + D3) 600-200 MG-UNIT TABS Take 1 tablet by mouth daily.   Cholecalciferol (VITAMIN D) 2000 units CAPS Take 2,000 Units by mouth daily.   Cyanocobalamin (B-12 PO) Take by mouth.   diazepam (VALIUM) 5 MG tablet Take 0.5-1 tablets (2.5-5 mg total) by mouth 2 (two) times daily as needed (vertigo).   EPINEPHrine 0.3 mg/0.3 mL IJ SOAJ injection Inject 0.3 mg into the muscle once.   Krill Oil 1000 MG CAPS Take 1 capsule by mouth daily.    Magnesium Hydroxide (MAGNESIA PO) Take 1 tablet by mouth daily.   metoprolol succinate (TOPROL-XL) 25 MG 24 hr tablet TAKE ONE-HALF (1/2) TABLET TWICE A DAY   rivaroxaban (XARELTO) 20 MG TABS tablet TAKE 1 TABLET DAILY WITH SUPPER   rosuvastatin (CRESTOR) 40 MG tablet TAKE 1 TABLET DAILY    Allergies: Patient is allergic to bee venom. Family History: Patient family history is not on file. Social History:  Patient  reports that she quit smoking about 12 years ago. Her smoking use included cigarettes. She has never used smokeless tobacco. She reports current alcohol use. She reports that she does not use drugs.  Review of Systems: Constitutional: Negative for fever malaise or anorexia Cardiovascular: negative for chest pain Respiratory: negative for SOB or persistent cough Gastrointestinal: negative for abdominal pain  Objective  Vitals: BP 128/88   Pulse (!) 58   Temp 98.6 F (37 C) (Temporal)   Ht 5\' 6"  (1.676 m)   Wt 122 lb 12.8 oz (55.7 kg)   SpO2 98%   BMI 19.82 kg/m  General: no acute distress , A&Ox3 Left TM obstructed with cerumen; after irrigation: red canal, Tm visualized   Commons side effects, risks, benefits, and alternatives for medications and treatment plan prescribed today were discussed, and the patient expressed understanding of the given instructions. Patient is instructed to call or message via MyChart if he/she has any  questions or concerns regarding our treatment plan. No barriers to understanding were identified. We discussed Red Flag symptoms and signs in detail. Patient expressed understanding regarding what to do in case of urgent or emergency type symptoms.  Medication list was reconciled, printed and provided to the patient in AVS. Patient instructions and summary information was reviewed with the patient as documented in the AVS. This note was prepared with assistance of Dragon voice recognition software. Occasional wrong-word or sound-a-like  substitutions may have occurred due to the inherent limitations of voice recognition software  This visit occurred during the SARS-CoV-2 public health emergency.  Safety protocols were in place, including screening questions prior to the visit, additional usage of staff PPE, and extensive cleaning of exam room while observing appropriate contact time as indicated for disinfecting solutions.

## 2020-12-13 ENCOUNTER — Encounter: Payer: Self-pay | Admitting: Physical Therapy

## 2020-12-13 ENCOUNTER — Ambulatory Visit: Payer: Medicare Other | Admitting: Physical Therapy

## 2020-12-13 DIAGNOSIS — R2689 Other abnormalities of gait and mobility: Secondary | ICD-10-CM | POA: Diagnosis not present

## 2020-12-13 DIAGNOSIS — R2681 Unsteadiness on feet: Secondary | ICD-10-CM | POA: Diagnosis not present

## 2020-12-13 DIAGNOSIS — R42 Dizziness and giddiness: Secondary | ICD-10-CM

## 2020-12-13 NOTE — Therapy (Signed)
[facility] [address] [place], [place], [address] Phone: [phone]   Fax:  [phone]  Physical Therapy Treatment and 10th Visit Progress Note  Patient Details  Name: Carly Myers MRN: [id number] Date of Birth: 05-03-1945 Referring Provider (PT): Billey Chang, MD   Encounter Date: 12/13/2020  Progress Note Reporting Period 10/30/2020 to 12/13/2020  See note below for Objective Data and Assessment of Progress/Goals.     PT End of Session - 12/13/20 1538     Visit Number 10    Number of Visits 13    Date for PT Re-Evaluation 12/14/20    Authorization Type Medicare A + B; BCBS Supplemental    Progress Note Due on Visit 20    PT Start Time 1450    PT Stop Time 1533    PT Time Calculation (min) 43 min    Activity Tolerance Patient tolerated treatment well    Behavior During Therapy WFL for tasks assessed/performed             Past Medical History:  Diagnosis Date   Allergic rhinitis    Anaphylactic reaction to bee sting    Anxiety state    unspecified   Arthritis    Ortho Dr. Berenice Primas   Disorder of bone    unspecified   Essential hypertension 12/28/2019   Hypercholesteremia    migrated   Hyperlipemia    Long term (current) use of anticoagulants 12/28/2019   PAF   Low back pain    Major depression    in complete remission   Osteopenia    Osteoporosis    PAF (paroxysmal atrial fibrillation) ([place]) 12/28/2019   Post-menopausal    Senile purpura ([place])    Shingles    Sinusitis    UTI (urinary tract infection)    Vitamin D deficiency     Past Surgical History:  Procedure Laterality Date   NO PAST SURGERIES      There were no vitals filed for this visit.   Subjective Assessment - 12/13/20 1452     Subjective L ear was irrigated; can hear better, "which makes my vestibular better."  Dizziness is better but is still planning on seeing Dr. Benjamine Mola in November.  Flared up after performing SOT and  took days for it to settle down.    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    Currently in Pain? No/denies                [facility] PT Assessment - 12/13/20 1455       Assessment   Medical Diagnosis Vestibular Neuritis    Referring Provider (PT) Billey Chang, MD    Onset Date/Surgical Date 09/24/20    Prior Therapy No Vestibular PT Prior      Precautions   Precautions Fall      Prior Function   Level of Independence Independent      Observation/Other Assessments   Focus on Therapeutic Outcomes (FOTO)  DPS: 60% and DFS: 63.7      Functional Gait  Assessment   Gait assessed  Yes    Gait Level Surface Walks 20 ft in less than 5.5 sec, no assistive devices, good speed, no evidence for imbalance, normal gait pattern, deviates no more than 6 in outside  of the 12 in walkway width.    Change in Gait Speed Able to smoothly change walking speed without loss of balance or gait deviation. Deviate no more than 6 in outside of the 12 in walkway width.    Gait with Horizontal Head Turns Performs head turns smoothly with no change in gait. Deviates no more than 6 in outside 12 in walkway width    Gait with Vertical Head Turns Performs head turns with no change in gait. Deviates no more than 6 in outside 12 in walkway width.    Gait and Pivot Turn Pivot turns safely within 3 sec and stops quickly with no loss of balance.    Step Over Obstacle Is able to step over 2 stacked shoe boxes taped together (9 in total height) without changing gait speed. No evidence of imbalance.    Gait with Narrow Base of Support Ambulates 7-9 steps.    Gait with Eyes Closed Walks 20 ft, slow speed, abnormal gait pattern, evidence for imbalance, deviates 10-15 in outside 12 in  walkway width. Requires more than 9 sec to ambulate 20 ft.    Ambulating Backwards Walks 20 ft, uses assistive device, slower speed, mild gait deviations, deviates 6-10 in outside 12 in walkway width.    Steps Alternating feet, must use rail.   due to knee pain   Total Score 25    FGA comment: 25/30 increased from 20/30                 Vestibular Assessment - 12/13/20 1503       Visual Acuity   Static 7    Dynamic 4   3 line difference, no dizziness     Balancemaster   Balancemaster Comment MCTSIB:  30 seconds condition 1, 2, 3 - significant sway with EC.  Condition 4: 3-4 seconds but unable to maintain      Positional Sensitivities   Sit to Supine No dizziness    Supine to Left Side No dizziness    Supine to Right Side No dizziness    Supine to Sitting No dizziness    Nose to Right Knee No dizziness    Right Knee to Sitting No dizziness    Nose to Left Knee No dizziness    Left Knee to Sitting No dizziness    Head Turning x 5 No dizziness    Head Nodding x 5 No dizziness    Pivot Right in Standing No dizziness    Pivot Left in Standing No dizziness    Rolling Right No dizziness    Rolling Left No dizziness                PT Education - 12/13/20 1537     Education Details progress towards goals, areas of ongoing impairment, will go on hold until pt sees ENT and then will decide whether or not to D/C or continue therapy    Person(s) Educated Patient    Methods Explanation    Comprehension Verbalized understanding              PT Short Term Goals - 12/06/20 1636       PT SHORT TERM GOAL #1   Title Patient will be independent with initial HEP for balance/vestibular (ALL STGs Due: 11/23/20)    Baseline no HEP established; reports independence with HEP    Time 3    Period Weeks    Status Achieved    Target Date 11/23/20  PT SHORT TERM GOAL #2   Title Patient will be able to hold situation 3 of M-CTSIB for >/= 30 seconds    Baseline 20 seconds;  30 seconds    Time 3    Period Weeks    Status Achieved      PT SHORT TERM GOAL #3   Title FGA to be assessed and LTG to be set    Baseline TBA; 20/30    Time 3    Period Weeks    Status Achieved               PT Long Term Goals - 12/13/20 1524       PT LONG TERM GOAL #1   Title Patient will be independent with final vestibular/balance HEP (All LTGs Due: 12/14/20)    Baseline 2x/week    Time 6    Period Weeks    Status Partially Met      PT LONG TERM GOAL #2   Title Patient will demo </= 2 line difference on DVA to indicative improved VOR function    Baseline 3 line difference    Time 6    Period Weeks    Status Not Met      PT LONG TERM GOAL #3   Title Patient will improve FGA to >/= 24 to demo reduced fall risk    Baseline 20/30 > 25/30    Time 6    Period Weeks    Status Achieved      PT LONG TERM GOAL #4   Title Patient will report </= 1/5 on all components of MSQ to demo improved tolerance for functional movement    Baseline 0/5 symptoms    Time 6    Period Weeks    Status Achieved      PT LONG TERM GOAL #5   Title Patient will be able to hold situation 4 of M-CTSIB for >/= 20 seconds    Baseline <5 seconds > 5 seconds    Time 6    Period Weeks    Status Not Met      PT LONG TERM GOAL #6   Title Patient will improve FOTO (DPS) to >/= 60%    Baseline 52% > 60%    Time 6    Period Weeks    Status Achieved                Plan - 12/13/20 1539     Clinical Impression Statement Performed assessment of patient's progress towards LTG.  Pt is making steady progress and has met 3/5 LTG.  Pt demonstrates full resolution of motion sensitivity, has met FOTO goal, and demonstrates improved dynamic balance during gait and demonstrates lower falls risk.  Pt did not meet DVA goal or MCTSIB goal and continues to present with 3 line difference, impaired VOR gain and significant difficulty maintaining balance and midline orientation when vision is removed.   Pt would benefit from continued skilled PT services to address unmet goals and ongoing impairments but pt would like to complete ENT assessment and then determine if she will need to return to therapy or not.  Pt will be on hold until after ENT assessment.    Personal Factors and Comorbidities Comorbidity 3+    Comorbidities Arthritis, HLD, LBP, Depression, Osteoporosis, PAF, HTN    Examination-Activity Limitations Bed Mobility;Bend;Locomotion Level;Stairs;Caring for Others    Examination-Participation Restrictions Cleaning;Community Activity;Yard Work    Stability/Clinical Decision Making Stable/Uncomplicated    Rehab Potential Good  PT Frequency 2x / week    PT Duration 6 weeks    PT Treatment/Interventions ADLs/Self Care Home Management;Canalith Repostioning;Cryotherapy;Moist Heat;Gait training;DME Instruction;Functional mobility training;Stair training;Therapeutic exercise;Balance training;Therapeutic activities;Neuromuscular re-education;Patient/family education;Manual techniques;Dry needling;Passive range of motion;Vestibular;Joint Manipulations    PT Next Visit Plan On hold until after Dr. Benjamine Mola appt, renew or D/C?    Consulted and Agree with Plan of Care Patient             Patient will benefit from skilled therapeutic intervention in order to improve the following deficits and impairments:  Decreased balance, Decreased activity tolerance, Dizziness, Postural dysfunction, Difficulty walking  Visit Diagnosis: Unsteadiness on feet  Dizziness and giddiness  Other abnormalities of gait and mobility     Problem List Patient Active Problem List   Diagnosis Date Noted   Vertigo 10/26/2020   Gait abnormality 10/26/2020   Cerebral vascular disease 10/26/2020   Essential hypertension 12/28/2019   PAF (paroxysmal atrial fibrillation) ([place]) 12/28/2019   Long term (current) use of anticoagulants 12/28/2019   Mixed hyperlipidemia 03/25/2017   Anxiety state 03/25/2017   Vitamin  deficiency 03/25/2017   Osteopenia 03/25/2017   Major depression 03/25/2017   Senile purpura ([place]) 03/25/2017   Allergic rhinitis 03/25/2017   Rico Junker, PT, DPT 12/13/20    3:52 PM    [facility] [address] [place], [place], [address] Phone: [phone]   Fax:  [phone]  Name: Carly Myers MRN: [id number] Date of Birth: 1945/08/31

## 2020-12-27 ENCOUNTER — Other Ambulatory Visit: Payer: Self-pay | Admitting: Nurse Practitioner

## 2020-12-27 MED ORDER — METOPROLOL SUCCINATE ER 25 MG PO TB24: 25.0000 mg | ORAL_TABLET | Freq: Two times a day (BID) | ORAL | 3 refills | Status: DC

## 2021-01-02 DIAGNOSIS — L819 Disorder of pigmentation, unspecified: Secondary | ICD-10-CM | POA: Diagnosis not present

## 2021-01-02 DIAGNOSIS — D692 Other nonthrombocytopenic purpura: Secondary | ICD-10-CM | POA: Diagnosis not present

## 2021-01-02 DIAGNOSIS — D1801 Hemangioma of skin and subcutaneous tissue: Secondary | ICD-10-CM | POA: Diagnosis not present

## 2021-01-02 DIAGNOSIS — Z85828 Personal history of other malignant neoplasm of skin: Secondary | ICD-10-CM | POA: Diagnosis not present

## 2021-01-02 DIAGNOSIS — L821 Other seborrheic keratosis: Secondary | ICD-10-CM | POA: Diagnosis not present

## 2021-01-08 DIAGNOSIS — H838X2 Other specified diseases of left inner ear: Secondary | ICD-10-CM | POA: Diagnosis not present

## 2021-01-08 DIAGNOSIS — H9042 Sensorineural hearing loss, unilateral, left ear, with unrestricted hearing on the contralateral side: Secondary | ICD-10-CM | POA: Diagnosis not present

## 2021-01-08 DIAGNOSIS — R42 Dizziness and giddiness: Secondary | ICD-10-CM | POA: Diagnosis not present

## 2021-01-10 DIAGNOSIS — M25562 Pain in left knee: Secondary | ICD-10-CM | POA: Diagnosis not present

## 2021-01-15 DIAGNOSIS — R42 Dizziness and giddiness: Secondary | ICD-10-CM | POA: Diagnosis not present

## 2021-01-17 ENCOUNTER — Other Ambulatory Visit: Payer: Self-pay | Admitting: Internal Medicine

## 2021-01-17 DIAGNOSIS — Z20828 Contact with and (suspected) exposure to other viral communicable diseases: Secondary | ICD-10-CM | POA: Diagnosis not present

## 2021-01-23 DIAGNOSIS — R42 Dizziness and giddiness: Secondary | ICD-10-CM | POA: Diagnosis not present

## 2021-01-23 DIAGNOSIS — H9042 Sensorineural hearing loss, unilateral, left ear, with unrestricted hearing on the contralateral side: Secondary | ICD-10-CM | POA: Diagnosis not present

## 2021-01-23 DIAGNOSIS — H832X2 Labyrinthine dysfunction, left ear: Secondary | ICD-10-CM | POA: Diagnosis not present

## 2021-01-24 DIAGNOSIS — M1712 Unilateral primary osteoarthritis, left knee: Secondary | ICD-10-CM | POA: Diagnosis not present

## 2021-01-31 ENCOUNTER — Ambulatory Visit: Payer: Medicare Other | Attending: Family Medicine

## 2021-01-31 ENCOUNTER — Other Ambulatory Visit: Payer: Self-pay

## 2021-01-31 DIAGNOSIS — R2681 Unsteadiness on feet: Secondary | ICD-10-CM | POA: Insufficient documentation

## 2021-01-31 DIAGNOSIS — M1712 Unilateral primary osteoarthritis, left knee: Secondary | ICD-10-CM | POA: Diagnosis not present

## 2021-01-31 DIAGNOSIS — R2689 Other abnormalities of gait and mobility: Secondary | ICD-10-CM | POA: Diagnosis not present

## 2021-01-31 DIAGNOSIS — R42 Dizziness and giddiness: Secondary | ICD-10-CM | POA: Insufficient documentation

## 2021-01-31 NOTE — Therapy (Signed)
[facility] [address] [place], [place], [address] Phone: [phone]   Fax:  [phone]  Physical Therapy Treatment/Re-Certification  Patient Details  Name: Carly Myers MRN: [id number] Date of Birth: 01/05/1946 Referring Provider (PT): Billey Chang, MD   Encounter Date: 01/31/2021   PT End of Session - 01/31/21 1013     Visit Number 11    Number of Visits 19    Date for PT Re-Evaluation 03/29/21    Authorization Type Medicare A + B; BCBS Supplemental    Progress Note Due on Visit 20    PT Start Time 1015    PT Stop Time 1057    PT Time Calculation (min) 42 min    Activity Tolerance Patient tolerated treatment well    Behavior During Therapy [facility] for tasks assessed/performed             Past Medical History:  Diagnosis Date   Allergic rhinitis    Anaphylactic reaction to bee sting    Anxiety state    unspecified   Arthritis    Ortho Dr. Berenice Primas   Disorder of bone    unspecified   Essential hypertension 12/28/2019   Hypercholesteremia    migrated   Hyperlipemia    Long term (current) use of anticoagulants 12/28/2019   PAF   Low back pain    Major depression    in complete remission   Osteopenia    Osteoporosis    PAF (paroxysmal atrial fibrillation) ([place]) 12/28/2019   Post-menopausal    Senile purpura ([place])    Shingles    Sinusitis    UTI (urinary tract infection)    Vitamin D deficiency     Past Surgical History:  Procedure Laterality Date   NO PAST SURGERIES      There were no vitals filed for this visit.   Subjective Assessment - 01/31/21 1016     Subjective Patient reports after the testing at ENT she was miserable. Reports it took a couple days for it to come down. Patient reports she still has instances when she feels off balanced. ENT results should decreased hearing in the L ear, as well as a hypofunction.    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    Currently in Pain? No/denies                [facility] PT Assessment - 01/31/21 0001       Assessment   Medical Diagnosis Vestibular Neuritis    Referring Provider (PT) Billey Chang, MD    Onset Date/Surgical Date 09/24/20      Observation/Other Assessments   Focus on Therapeutic Outcomes (FOTO)  DPS: 60% and DFS: 63.7      Functional Gait  Assessment   Gait assessed  Yes    Gait Level Surface Walks 20 ft in less than 5.5 sec, no assistive devices, good speed, no evidence for imbalance, normal gait pattern, deviates no more than 6 in outside of the 12 in walkway width.    Change in Gait Speed Able to smoothly change walking speed without loss of balance or gait deviation. Deviate no more than 6 in outside of the 12 in walkway width.    Gait with Horizontal Head Turns Performs head turns smoothly with  slight change in gait velocity (eg, minor disruption to smooth gait path), deviates 6-10 in outside 12 in walkway width, or uses an assistive device.    Gait with Vertical Head Turns Performs head turns with no change in gait. Deviates no more than 6 in outside 12 in walkway width.    Gait and Pivot Turn Pivot turns safely within 3 sec and stops quickly with no loss of balance.    Step Over Obstacle Is able to step over 2 stacked shoe boxes taped together (9 in total height) without changing gait speed. No evidence of imbalance.    Gait with Narrow Base of Support Ambulates 7-9 steps.    Gait with Eyes Closed Walks 20 ft, slow speed, abnormal gait pattern, evidence for imbalance, deviates 10-15 in outside 12 in walkway width. Requires more than 9 sec to ambulate 20 ft.    Ambulating Backwards Walks 20 ft, uses assistive device, slower speed, mild gait deviations, deviates 6-10 in  outside 12 in walkway width.    Steps Alternating feet, must use rail.    Total Score 24    FGA comment: 24/30              Vestibular Assessment - 01/31/21 0001       Visual Acuity   Static 10    Dynamic 6      Positional Sensitivities   Positional Sensitivities Comments denies dizziness with any bed mobility/positions              [facility] Adult PT Treatment/Exercise - 01/31/21 0001       Ambulation/Gait   Ambulation/Gait Yes    Ambulation/Gait Assistance 5: Supervision    Ambulation/Gait Assistance Details indoor on level surfaces    Assistive device None    Gait Pattern Wide base of support    Ambulation Surface Level;Indoor    Stairs Yes    Stairs Assistance 5: Supervision    Stairs Assistance Details (indicate cue type and reason) completed without rails patient require supervision, with rails patient able to complete independently. mild unsteadiness without UE support.    Stair Management Technique No rails;Two rails;Alternating pattern;Forwards    Number of Stairs 8    Height of Stairs 6      Neuro Re-ed    Neuro Re-ed Details  Completed M-CTSIB: able to hold situation 1: 30 seconds, situation 2: 20 seconds, situation 3: 30 seconds, situation 4: 12 seconds.             Vestibular Treatment/Exercise - 01/31/21 0001       Vestibular Treatment/Exercise   Vestibular Treatment Provided Gaze    Gaze Exercises X1 Viewing Horizontal;X1 Viewing Vertical      X1 Viewing Horizontal   Foot Position feet apart, solid surface    Reps 2    Comments 60 seconds; progressing speed of head movement      X1 Viewing Vertical   Foot Position feet apart, solid surface    Reps 2    Comments 60 seconds; progressing speed of head movement              Gaze Stabilization: Standing Feet Apart    Feet shoulder width apart, keeping eyes on target on wall 3 feet away, tilt head down 15-30 and move head side to side for 60 seconds. Repeat while moving head up and  down for 60 seconds. Do 2-3 sessions per day.  Access Code: [email or web address] URL: [email or web address] Date: 11/22/2020 Prepared by: Misty Stanley   Exercises  Standing Balance with Eyes Closed on Foam - 1 x daily - 5 x weekly - 1 sets - 3 reps - 30 seconds hold Standing with Head Rotation on Pillow - 1 x daily - 5 x weekly - 2 sets - 10 reps Standing with Head Nod on Pillow - 1 x daily - 5 x weekly - 2 sets - 10 reps Tandem Walking with Counter Support - 1 x daily - 5 x weekly - 1 sets - 3-4 reps Walking with Head Rotation - 1 x daily - 5 x weekly - 1 sets - 3-4 reps Single Leg Stance with Support - 1 x daily - 7 x weekly - 3 sets - 10 second hold      PT Education - 01/31/21 1100     Education Details Updated POC; Review VOR    Person(s) Educated Patient    Methods Explanation    Comprehension Verbalized understanding              PT Short Term Goals - 01/31/21 1207       PT SHORT TERM GOAL #1   Title Patient will improve situation 2 of M-CTSIB to >/= 25 seconds    Baseline 20 seconds    Time 3    Period Weeks    Status New               PT Long Term Goals - 01/31/21 1204       PT LONG TERM GOAL #1   Title Patient will be independent with final vestibular/balance HEP (All LTGs Due: 03/29/21)    Baseline will continue to benefit from progressive HEP    Time 8    Period Weeks    Status On-going      PT LONG TERM GOAL #2   Title Patient will demo </= 2 line difference on DVA to indicative improved VOR function    Baseline 3 line difference; 4 line difference    Time 8    Period Weeks    Status On-going      PT LONG TERM GOAL #3   Title Patient will improve FGA to >/= 26/30 to demo reduced fall risk    Baseline 20/30 > 25/30; 24/30    Time 8    Period Weeks    Status Revised      PT LONG TERM GOAL #4   Title Patient will be able to ambulate > /= 115 ft with scanning environement and no LOB noted    Baseline unsteadiness/LOB noted with  horizontal head turns    Time 8    Period Weeks    Status New      PT LONG TERM GOAL #5   Title Patient will be able to hold situation 4 of M-CTSIB for >/= 15 seconds    Baseline <5 seconds > 5 seconds;  12 seconds    Time 8    Period Weeks    Status New                 Plan - 01/31/21 1054     Clinical Impression Statement Patient returns after being on Hold for PT servies to see ENT. 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 Reassess patient current function with no significant change in patient balance since last scheduled visit. Patient scored 24/30 on FGA demonstrating mild fall risk. Patient continue to demo difficulty with eyes closed and head  turns. Patient demo continued challenge with VOR with 4 line difference on DVA. Patient will continue to benefit from skilled PT services to address impairments and improve stability with functional activities.    Personal Factors and Comorbidities Comorbidity 3+    Comorbidities Arthritis, HLD, LBP, Depression, Osteoporosis, PAF, HTN    Examination-Activity Limitations Bed Mobility;Bend;Locomotion Level;Stairs;Caring for Others    Examination-Participation Restrictions Cleaning;Community Activity;Yard Work    Stability/Clinical Decision Making Stable/Uncomplicated    Rehab Potential Good    PT Frequency 1x / week    PT Duration 8 weeks    PT Treatment/Interventions ADLs/Self Care Home Management;Canalith Repostioning;Cryotherapy;Moist Heat;Gait training;DME Instruction;Functional mobility training;Stair training;Therapeutic exercise;Balance training;Therapeutic activities;Neuromuscular re-education;Patient/family education;Manual techniques;Dry needling;Passive range of motion;Vestibular;Joint Manipulations    PT Next Visit Plan review HEP/VOR and progress as tolerated. Continued activties working on eyes closed/head turns, dynamic gait.    Consulted and Agree with  Plan of Care Patient             Patient will benefit from skilled therapeutic intervention in order to improve the following deficits and impairments:  Decreased balance, Decreased activity tolerance, Dizziness, Postural dysfunction, Difficulty walking  Visit Diagnosis: Unsteadiness on feet  Dizziness and giddiness  Other abnormalities of gait and mobility     Problem List Patient Active Problem List   Diagnosis Date Noted   Vertigo 10/26/2020   Gait abnormality 10/26/2020   Cerebral vascular disease 10/26/2020   Essential hypertension 12/28/2019   PAF (paroxysmal atrial fibrillation) ([place]) 12/28/2019   Long term (current) use of anticoagulants 12/28/2019   Mixed hyperlipidemia 03/25/2017   Anxiety state 03/25/2017   Vitamin deficiency 03/25/2017   Osteopenia 03/25/2017   Major depression 03/25/2017   Senile purpura ([place]) 03/25/2017   Allergic rhinitis 03/25/2017    Jones Bales, PT, DPT 01/31/2021, 12:12 PM  [facility] [address] [place], [place], [address] Phone: [phone]   Fax:  [phone]  Name: Carly Myers MRN: [id number] Date of Birth: 17-Oct-1945

## 2021-02-05 ENCOUNTER — Telehealth: Payer: Self-pay | Admitting: Family Medicine

## 2021-02-05 NOTE — Progress Notes (Signed)
°  Care Management   Follow Up Note   02/05/2021 Name: [name] MRN: [id number] DOB: Apr 10, 1945   Referred by: Leamon Arnt, MD Reason for referral : No chief complaint on file.   An unsuccessful telephone outreach was attempted today. The patient was referred to the case management team for assistance with care management and care coordination.   Follow Up Plan: The care management team will reach out to the patient again over the next 7 days.   Carly Myers

## 2021-02-05 NOTE — Chronic Care Management (AMB) (Signed)
Care Management  Note   02/05/2021 Name: Teaghan Melrose MRN: [id number] DOB: Jul 13, 1945  [place] Sarinana is a 75 y.o. year old female who is a primary care patient of Leamon Arnt, MD. The care management team was consulted for assistance with chronic disease management and care coordination needs.   Ms. Utke was given information about Care Management services today including:  CCM service includes personalized support from designated clinical staff supervised by the physician, including individualized plan of care and coordination with other care providers 24/7 contact phone numbers for assistance for urgent and routine care needs. Service will only be billed when office clinical staff spend 20 minutes or more in a month to coordinate care. Only one practitioner may furnish and bill the service in a calendar month. The patient may stop CCM services at amy time (effective at the end of the month) by phone call to the office staff. The patient will be responsible for cost sharing (co-pay) or up to 20% of the service fee (after annual deductible is met)  Patient agreed to services and verbal consent obtained.  Follow up plan:   An initial telephone outreach has been scheduled for: 03/12/21 $RemoveBefo'@[email or web address]$   Secundino Ginger [employer]

## 2021-02-07 DIAGNOSIS — M1712 Unilateral primary osteoarthritis, left knee: Secondary | ICD-10-CM | POA: Diagnosis not present

## 2021-02-07 DIAGNOSIS — M19072 Primary osteoarthritis, left ankle and foot: Secondary | ICD-10-CM | POA: Diagnosis not present

## 2021-02-14 ENCOUNTER — Ambulatory Visit: Payer: Medicare Other

## 2021-02-14 ENCOUNTER — Other Ambulatory Visit: Payer: Self-pay

## 2021-02-14 DIAGNOSIS — R2681 Unsteadiness on feet: Secondary | ICD-10-CM

## 2021-02-14 DIAGNOSIS — R42 Dizziness and giddiness: Secondary | ICD-10-CM

## 2021-02-14 DIAGNOSIS — R2689 Other abnormalities of gait and mobility: Secondary | ICD-10-CM | POA: Diagnosis not present

## 2021-02-14 NOTE — Patient Instructions (Signed)
Access Code: [email or web address] URL: [email or web address] Date: 02/14/2021 Prepared by: Baldomero Lamy  Exercises Standing Balance with Eyes Closed on Foam - 1 x daily - 5 x weekly - 1 sets - 3 reps - 30 seconds hold Romberg Stance on Foam Pad with Head Rotation - 1 x daily - 5 x weekly - 2 sets - 10 reps Romberg Stance with Head Nods on Foam Pad - 1 x daily - 5 x weekly - 2 sets - 10 reps Tandem Stance - 1 x daily - 5 x weekly - 1 sets - 3 reps - 30 seconds hold Single Leg Stance with Support - 1 x daily - 7 x weekly - 3 sets - 10 second hold Tandem Walking with Counter Support - 1 x daily - 5 x weekly - 1 sets - 3-4 reps Walking with Head Rotation - 1 x daily - 5 x weekly - 1 sets - 3-4 reps

## 2021-02-14 NOTE — Therapy (Signed)
[facility] [address] [place], [place], [address] Phone: [phone]   Fax:  [phone]  Physical Therapy Treatment  Patient Details  Name: Carly Myers MRN: [id number] Date of Birth: [birth year] Referring Provider (PT): Billey Chang, MD   Encounter Date: 02/14/2021   PT End of Session - 02/14/21 1454     Visit Number 12    Number of Visits 19    Date for PT Re-Evaluation 03/29/21    Authorization Type Medicare A + B; BCBS Supplemental    Progress Note Due on Visit 20    PT Start Time 1447    PT Stop Time 1529    PT Time Calculation (min) 42 min    Activity Tolerance Patient tolerated treatment well    Behavior During Therapy [facility] for tasks assessed/performed             Past Medical History:  Diagnosis Date   Allergic rhinitis    Anaphylactic reaction to bee sting    Anxiety state    unspecified   Arthritis    Ortho Dr. Berenice Primas   Disorder of bone    unspecified   Essential hypertension 12/28/2019   Hypercholesteremia    migrated   Hyperlipemia    Long term (current) use of anticoagulants 12/28/2019   PAF   Low back pain    Major depression    in complete remission   Osteopenia    Osteoporosis    PAF (paroxysmal atrial fibrillation) ([place]) 12/28/2019   Post-menopausal    Senile purpura ([place])    Shingles    Sinusitis    UTI (urinary tract infection)    Vitamin D deficiency     Past Surgical History:  Procedure Laterality Date   NO PAST SURGERIES      There were no vitals filed for this visit.   Subjective Assessment - 02/14/21 1451     Subjective Patient reports that she has been doing the exercises at the end of the day. No other new changes/complaints. Reports she is worried her pain is going to go out.    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    Currently in Pain? No/denies               Vestibular Treatment/Exercise - 02/14/21 0001       Vestibular Treatment/Exercise   Vestibular Treatment Provided Gaze    Gaze Exercises X1 Viewing Horizontal;X1 Viewing Vertical      X1 Viewing Horizontal   Foot Position feet apart, solid surface > feet together, solid surface    Reps 2    Comments x 60 seconds, x 30 seconds with feet together. demo on how to use metronome to stay on beat      X1 Viewing Vertical   Foot Position feet apart, solid surface > feet together, solid surface    Reps 2    Comments x 60 seconds, x 30 seconds with feet together.            Reviewed and updated entire HEP. Completed the following exercises during session, bolded are new additions/progressions:   Access Code: [email or web address] URL: [email or web address] Date: 02/14/2021 Prepared by: Baldomero Lamy   Exercises Standing Balance with Eyes Closed on Foam - 1 x daily - 5  x weekly - 1 sets - 3 reps - 30 seconds hold Romberg Stance on Foam Pad with Head Rotation - 1 x daily - 5 x weekly - 2 sets - 10 reps Romberg Stance with Head Nods on Foam Pad - 1 x daily - 5 x weekly - 2 sets - 10 reps Tandem Stance - 1 x daily - 5 x weekly - 1 sets - 3 reps - 30 seconds hold Single Leg Stance with Support - 1 x daily - 7 x weekly - 3 sets - 10 second hold Tandem Walking with Counter Support - 1 x daily - 5 x weekly - 1 sets - 3-4 reps Walking with Head Rotation - 1 x daily - 5 x weekly - 1 sets - 3-4 reps       PT Education - 02/14/21 1531     Education Details HEP Updated (see medbridge program details)    Person(s) Educated Patient    Methods Explanation;Demonstration;Handout    Comprehension Returned demonstration;Verbalized understanding              PT Short Term Goals - 01/31/21 1207       PT SHORT TERM GOAL #1    Title Patient will improve situation 2 of M-CTSIB to >/= 25 seconds    Baseline 20 seconds    Time 3    Period Weeks    Status New               PT Long Term Goals - 01/31/21 1204       PT LONG TERM GOAL #1   Title Patient will be independent with final vestibular/balance HEP (All LTGs Due: 03/29/21)    Baseline will continue to benefit from progressive HEP    Time 8    Period Weeks    Status On-going      PT LONG TERM GOAL #2   Title Patient will demo </= 2 line difference on DVA to indicative improved VOR function    Baseline 3 line difference; 4 line difference    Time 8    Period Weeks    Status On-going      PT LONG TERM GOAL #3   Title Patient will improve FGA to >/= 26/30 to demo reduced fall risk    Baseline 20/30 > 25/30; 24/30    Time 8    Period Weeks    Status Revised      PT LONG TERM GOAL #4   Title Patient will be able to ambulate > /= 115 ft with scanning environement and no LOB noted    Baseline unsteadiness/LOB noted with horizontal head turns    Time 8    Period Weeks    Status New      PT LONG TERM GOAL #5   Title Patient will be able to hold situation 4 of M-CTSIB for >/= 15 seconds    Baseline <5 seconds > 5 seconds;  12 seconds    Time 8    Period Weeks    Status New                   Plan - 02/14/21 1534     Clinical Impression Statement Patietn requesting clarification on her hypofunction, PT 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 Rest of session sepnt reviewing VOR x 1 slowly progressing to more narrow BOS, and progressing balance exercies on complaint surfaces to further improve vestibular input.  Personal Factors and Comorbidities Comorbidity 3+    Comorbidities Arthritis, HLD, LBP, Depression, Osteoporosis, PAF, HTN    Examination-Activity Limitations Bed Mobility;Bend;Locomotion Level;Stairs;Caring for Others    Examination-Participation Restrictions Cleaning;Community  Activity;Yard Work    Stability/Clinical Decision Making Stable/Uncomplicated    Rehab Potential Good    PT Frequency 1x / week    PT Duration 8 weeks    PT Treatment/Interventions ADLs/Self Care Home Management;Canalith Repostioning;Cryotherapy;Moist Heat;Gait training;DME Instruction;Functional mobility training;Stair training;Therapeutic exercise;Balance training;Therapeutic activities;Neuromuscular re-education;Patient/family education;Manual techniques;Dry needling;Passive range of motion;Vestibular;Joint Manipulations    PT Next Visit Plan How was HEP? continue progression of VOR. Continued activties working on eyes closed/head turns, dynamic gait. Activites working on improved vestibular input.    Consulted and Agree with Plan of Care Patient             Patient will benefit from skilled therapeutic intervention in order to improve the following deficits and impairments:  Decreased balance, Decreased activity tolerance, Dizziness, Postural dysfunction, Difficulty walking  Visit Diagnosis: Unsteadiness on feet  Dizziness and giddiness  Other abnormalities of gait and mobility     Problem List Patient Active Problem List   Diagnosis Date Noted   Vertigo 10/26/2020   Gait abnormality 10/26/2020   Cerebral vascular disease 10/26/2020   Essential hypertension 12/28/2019   PAF (paroxysmal atrial fibrillation) ([place]) 12/28/2019   Long term (current) use of anticoagulants 12/28/2019   Mixed hyperlipidemia 03/25/2017   Anxiety state 03/25/2017   Vitamin deficiency 03/25/2017   Osteopenia 03/25/2017   Major depression 03/25/2017   Senile purpura ([place]) 03/25/2017   Allergic rhinitis 03/25/2017    Jones Bales, PT, DPT 02/14/2021, 3:36 PM  [facility] [address] [place], [place], [address] Phone: [phone]   Fax:  [phone]  Name: Carly Myers MRN: [id number] Date of Birth: September 27, 1945

## 2021-02-21 ENCOUNTER — Encounter: Payer: Self-pay | Admitting: Physical Therapy

## 2021-02-21 ENCOUNTER — Other Ambulatory Visit: Payer: Self-pay

## 2021-02-21 ENCOUNTER — Ambulatory Visit: Payer: Medicare Other | Admitting: Physical Therapy

## 2021-02-21 DIAGNOSIS — R2689 Other abnormalities of gait and mobility: Secondary | ICD-10-CM | POA: Diagnosis not present

## 2021-02-21 DIAGNOSIS — R42 Dizziness and giddiness: Secondary | ICD-10-CM | POA: Diagnosis not present

## 2021-02-21 DIAGNOSIS — R2681 Unsteadiness on feet: Secondary | ICD-10-CM

## 2021-02-22 NOTE — Therapy (Signed)
[facility] [address] [place], [place], [address] Phone: [phone]   Fax:  [phone]  Physical Therapy Treatment  Patient Details  Name: Carly Myers MRN: [id number] Date of Birth: January 01, 1946 Referring Provider (PT): Billey Chang, MD   Encounter Date: 02/21/2021   PT End of Session - 02/21/21 1656     Visit Number 13    Number of Visits 19    Date for PT Re-Evaluation 03/29/21    Authorization Type Medicare A + B; BCBS Supplemental    Progress Note Due on Visit 20    PT Start Time 1402    PT Stop Time 1449    PT Time Calculation (min) 47 min    Activity Tolerance Patient tolerated treatment well    Behavior During Therapy [facility] for tasks assessed/performed             Past Medical History:  Diagnosis Date   Allergic rhinitis    Anaphylactic reaction to bee sting    Anxiety state    unspecified   Arthritis    Ortho Dr. Berenice Primas   Disorder of bone    unspecified   Essential hypertension 12/28/2019   Hypercholesteremia    migrated   Hyperlipemia    Long term (current) use of anticoagulants 12/28/2019   PAF   Low back pain    Major depression    in complete remission   Osteopenia    Osteoporosis    PAF (paroxysmal atrial fibrillation) ([place]) 12/28/2019   Post-menopausal    Senile purpura ([place])    Shingles    Sinusitis    UTI (urinary tract infection)    Vitamin D deficiency     Past Surgical History:  Procedure Laterality Date   NO PAST SURGERIES      There were no vitals filed for this visit.   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     Currently in Pain? No/denies                                Vestibular Treatment/Exercise - 02/22/21 0001       Vestibular Treatment/Exercise   Vestibular Treatment Provided Gaze    Gaze Exercises X1 Viewing Horizontal;X1 Viewing Vertical      X1 Viewing Horizontal   Foot Position bil. stance, plain background    Time --   60 secs   Reps 1    Comments no c/o incr. symptoms upon completion of exercise      X1 Viewing Vertical   Foot Position bil. stance, plain backround    Time --   60 secs   Reps 1    Comments pt c/o legs feeling shaky upon completion of this exercise (performed after horizontal head turns)                Balance Exercises - 02/22/21 0001       Balance Exercises: Standing   Standing Eyes Opened Narrow base of support (BOS);Head turns;Foam/compliant surface;5 reps   horizontal & vertical head turns   Standing Eyes Closed Narrow base of support (  BOS);Head turns;Foam/compliant surface;5 reps;Wide (BOA)   horizontal & vertical head turns - feet apart and together   Standing Eyes Closed Limitations pt stood on 2 pillows in corner    Rockerboard Anterior/posterior;Head turns;EO;EC;10 reps   10 reps with EO; 20 reps with EC with min UE support on // bars   Gait with Head Turns Forward;1 rep   30'   Marching Foam/compliant surface;Head turns;Static   performed standing on 2 pillows in corner   Other Standing Exercises Pt performed stepping down from pillows to floor with head turns - 10 reps each leg - to improve SLS on each leg with standing on compliant surface    Other Standing Exercises Comments Pt performed marching on incline with EC with min to mod assist - no head turns 5 reps x 2 sets                PT Education - 02/21/21 1654     Education Details recommended pt to perform x1 viewing exercise in the am (at least 2x /day if able) rather than only performing 1 rep at end of day which she states she is currently doing     Person(s) Educated Patient    Methods Explanation    Comprehension Verbalized understanding              PT Short Term Goals - 02/22/21 1600       PT SHORT TERM GOAL #1   Title Patient will improve situation 2 of M-CTSIB to >/= 25 seconds    Baseline 20 seconds    Time 3    Period Weeks    Status New               PT Long Term Goals - 02/22/21 1600       PT LONG TERM GOAL #1   Title Patient will be independent with final vestibular/balance HEP (All LTGs Due: 03/29/21)    Baseline will continue to benefit from progressive HEP    Time 8    Period Weeks    Status On-going      PT LONG TERM GOAL #2   Title Patient will demo </= 2 line difference on DVA to indicative improved VOR function    Baseline 3 line difference; 4 line difference    Time 8    Period Weeks    Status On-going      PT LONG TERM GOAL #3   Title Patient will improve FGA to >/= 26/30 to demo reduced fall risk    Baseline 20/30 > 25/30; 24/30    Time 8    Period Weeks    Status Revised      PT LONG TERM GOAL #4   Title Patient will be able to ambulate > /= 115 ft with scanning environement and no LOB noted    Baseline unsteadiness/LOB noted with horizontal head turns    Time 8    Period Weeks    Status New      PT LONG TERM GOAL #5   Title Patient will be able to hold situation 4 of M-CTSIB for >/= 15 seconds    Baseline <5 seconds > 5 seconds;  12 seconds    Time 8    Period Weeks    Status New                   Plan - 02/21/21 1412     Clinical Impression Statement Pt continues to have most  difficulty with maintaining balance on compliant surface with EC, indicative of decreased vestibular input in maintaining balance.  Performance does improve with practice and repetition.  Cont with POC.    Personal Factors and Comorbidities Comorbidity 3+    Comorbidities Arthritis, HLD, LBP, Depression, Osteoporosis, PAF, HTN    Examination-Activity Limitations Bed  Mobility;Bend;Locomotion Level;Stairs;Caring for Others    Examination-Participation Restrictions Cleaning;Community Activity;Yard Work    Stability/Clinical Decision Making Stable/Uncomplicated    Rehab Potential Good    PT Frequency 1x / week    PT Duration 8 weeks    PT Treatment/Interventions ADLs/Self Care Home Management;Canalith Repostioning;Cryotherapy;Moist Heat;Gait training;DME Instruction;Functional mobility training;Stair training;Therapeutic exercise;Balance training;Therapeutic activities;Neuromuscular re-education;Patient/family education;Manual techniques;Dry needling;Passive range of motion;Vestibular;Joint Manipulations    PT Next Visit Plan Continue activties working on eyes closed/head turns, dynamic gait. Activites working on improved vestibular input.    Consulted and Agree with Plan of Care Patient             Patient will benefit from skilled therapeutic intervention in order to improve the following deficits and impairments:  Decreased balance, Decreased activity tolerance, Dizziness, Postural dysfunction, Difficulty walking  Visit Diagnosis: Unsteadiness on feet  Other abnormalities of gait and mobility     Problem List Patient Active Problem List   Diagnosis Date Noted   Vertigo 10/26/2020   Gait abnormality 10/26/2020   Cerebral vascular disease 10/26/2020   Essential hypertension 12/28/2019   PAF (paroxysmal atrial fibrillation) ([place]) 12/28/2019   Long term (current) use of anticoagulants 12/28/2019   Mixed hyperlipidemia 03/25/2017   Anxiety state 03/25/2017   Vitamin deficiency 03/25/2017   Osteopenia 03/25/2017   Major depression 03/25/2017   Senile purpura ([place]) 03/25/2017   Allergic rhinitis 03/25/2017    Alda Lea, PT 02/22/2021, 4:02 PM  [facility] [address] [place], [place], [address] Phone: [phone]   Fax:  [phone]  Name: Carly Myers MRN:  [id number] Date of Birth: 31-Mar-1945

## 2021-02-28 ENCOUNTER — Other Ambulatory Visit: Payer: Self-pay

## 2021-02-28 ENCOUNTER — Ambulatory Visit: Payer: Medicare Other | Attending: Family Medicine

## 2021-02-28 DIAGNOSIS — R2689 Other abnormalities of gait and mobility: Secondary | ICD-10-CM | POA: Insufficient documentation

## 2021-02-28 DIAGNOSIS — R42 Dizziness and giddiness: Secondary | ICD-10-CM | POA: Insufficient documentation

## 2021-02-28 DIAGNOSIS — R2681 Unsteadiness on feet: Secondary | ICD-10-CM | POA: Insufficient documentation

## 2021-02-28 NOTE — Therapy (Signed)
[facility] [address] [place], [place], [address] Phone: [phone]   Fax:  [phone]  Physical Therapy Treatment/Discharge Summary  Patient Details  Name: Carly Myers MRN: [id number] Date of Birth: [birth year] Referring Provider (PT): Billey Chang, MD  PHYSICAL THERAPY DISCHARGE SUMMARY  Visits from Start of Care: 14  Current functional level related to goals / functional outcomes: Patient requesting to d/c  (take a break) from PT services at this time.    Remaining deficits: Mild Imbalance, Dizziness   Education / Equipment: HEP Provided   Patient agrees to discharge. Patient goals were not met as unable to assess per patient request. Patient is being discharged due to the patient's request.   Encounter Date: 02/28/2021   PT End of Session - 02/28/21 1448     Visit Number 14    Number of Visits 19    Date for PT Re-Evaluation 03/29/21    Authorization Type Medicare A + B; BCBS Supplemental    Progress Note Due on Visit 20    PT Start Time 1448    PT Stop Time 1500    PT Time Calculation (min) 12 min    Activity Tolerance Patient tolerated treatment well    Behavior During Therapy WFL for tasks assessed/performed             Past Medical History:  Diagnosis Date   Allergic rhinitis    Anaphylactic reaction to bee sting    Anxiety state    unspecified   Arthritis    Ortho Dr. Berenice Primas   Disorder of bone    unspecified   Essential hypertension 12/28/2019   Hypercholesteremia    migrated   Hyperlipemia    Long term (current) use of anticoagulants 12/28/2019   PAF   Low back pain    Major depression    in complete remission   Osteopenia    Osteoporosis    PAF (paroxysmal atrial fibrillation) ([place]) 12/28/2019   Post-menopausal    Senile purpura ([place])    Shingles    Sinusitis    UTI (urinary tract infection)    Vitamin D deficiency     Past Surgical History:  Procedure Laterality Date    NO PAST SURGERIES      There were no vitals filed for this visit.   Subjective Assessment - 02/28/21 1450     Subjective Reports not sure exercises are beneficial.    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    Currently in Pain? No/denies               [facility] Adult PT Treatment/Exercise - 02/28/21 0001       Self-Care   Self-Care Other Self-Care Comments    Other Self-Care Comments  PT educating on self management of symptoms, HEP compliance. Slow rate of healing with vestibular system after insult and expected time frame of healing. Pt verbalized understanding.                     PT Education - 02/28/21 1507     Education Details educated on HEP compliance, effect of anxiety on symptoms    Person(s) Educated Patient    Methods Explanation    Comprehension Verbalized understanding  PT Short Term Goals - 02/22/21 1600       PT SHORT TERM GOAL #1   Title Patient will improve situation 2 of M-CTSIB to >/= 25 seconds    Baseline 20 seconds    Time 3    Period Weeks    Status New               PT Long Term Goals - 02/22/21 1600       PT LONG TERM GOAL #1   Title Patient will be independent with final vestibular/balance HEP (All LTGs Due: 03/29/21)    Baseline will continue to benefit from progressive HEP    Time 8    Period Weeks    Status On-going      PT LONG TERM GOAL #2   Title Patient will demo </= 2 line difference on DVA to indicative improved VOR function    Baseline 3 line difference; 4 line difference    Time 8    Period Weeks    Status On-going      PT LONG TERM GOAL #3   Title Patient will improve FGA to >/= 26/30 to demo reduced fall risk    Baseline 20/30 > 25/30; 24/30     Time 8    Period Weeks    Status Revised      PT LONG TERM GOAL #4   Title Patient will be able to ambulate > /= 115 ft with scanning environement and no LOB noted    Baseline unsteadiness/LOB noted with horizontal head turns    Time 8    Period Weeks    Status New      PT LONG TERM GOAL #5   Title Patient will be able to hold situation 4 of M-CTSIB for >/= 15 seconds    Baseline <5 seconds > 5 seconds;  12 seconds    Time 8    Period Weeks    Status New                   Plan - 02/28/21 1509     Clinical Impression Statement Pt feeling as has hit a plateua in progress iwth intermittent ups/downs. Would like to take a break from PT services at this time and focus on HEP compliance at home and follow up in a few months. PT verbalized understanding, and provided education on healing process.    Personal Factors and Comorbidities Comorbidity 3+    Comorbidities Arthritis, HLD, LBP, Depression, Osteoporosis, PAF, HTN    Examination-Activity Limitations Bed Mobility;Bend;Locomotion Level;Stairs;Caring for Others    Examination-Participation Restrictions Cleaning;Community Activity;Yard Work    Stability/Clinical Decision Making Stable/Uncomplicated    Rehab Potential Good    PT Frequency 1x / week    PT Duration 8 weeks    PT Treatment/Interventions ADLs/Self Care Home Management;Canalith Repostioning;Cryotherapy;Moist Heat;Gait training;DME Instruction;Functional mobility training;Stair training;Therapeutic exercise;Balance training;Therapeutic activities;Neuromuscular re-education;Patient/family education;Manual techniques;Dry needling;Passive range of motion;Vestibular;Joint Manipulations    PT Next Visit Plan d/c this visit    Consulted and Agree with Plan of Care Patient             Patient will benefit from skilled therapeutic intervention in order to improve the following deficits and impairments:  Decreased balance, Decreased activity tolerance, Dizziness,  Postural dysfunction, Difficulty walking  Visit Diagnosis: Unsteadiness on feet  Other abnormalities of gait and mobility  Dizziness and giddiness     Problem List Patient Active Problem List   Diagnosis Date Noted  Vertigo 10/26/2020   Gait abnormality 10/26/2020   Cerebral vascular disease 10/26/2020   Essential hypertension 12/28/2019   PAF (paroxysmal atrial fibrillation) ([place]) 12/28/2019   Long term (current) use of anticoagulants 12/28/2019   Mixed hyperlipidemia 03/25/2017   Anxiety state 03/25/2017   Vitamin deficiency 03/25/2017   Osteopenia 03/25/2017   Major depression 03/25/2017   Senile purpura ([place]) 03/25/2017   Allergic rhinitis 03/25/2017    Jones Bales, PT, DPT 02/28/2021, 3:11 PM  [facility] [address] [place], [place], [address] Phone: [phone]   Fax:  [phone]  Name: Carly Myers MRN: [id number] Date of Birth: [birth year]

## 2021-03-07 ENCOUNTER — Ambulatory Visit: Payer: Medicare Other

## 2021-03-08 DIAGNOSIS — Z85828 Personal history of other malignant neoplasm of skin: Secondary | ICD-10-CM | POA: Diagnosis not present

## 2021-03-08 DIAGNOSIS — C44319 Basal cell carcinoma of skin of other parts of face: Secondary | ICD-10-CM | POA: Diagnosis not present

## 2021-03-08 DIAGNOSIS — L570 Actinic keratosis: Secondary | ICD-10-CM | POA: Diagnosis not present

## 2021-03-12 ENCOUNTER — Telehealth: Payer: Medicare Other

## 2021-03-14 ENCOUNTER — Encounter: Payer: Medicare Other | Admitting: Physical Therapy

## 2021-04-05 DIAGNOSIS — Z20822 Contact with and (suspected) exposure to covid-19: Secondary | ICD-10-CM | POA: Diagnosis not present

## 2021-05-03 ENCOUNTER — Ambulatory Visit: Payer: Medicare Other | Admitting: Family Medicine

## 2021-05-08 ENCOUNTER — Ambulatory Visit (INDEPENDENT_AMBULATORY_CARE_PROVIDER_SITE_OTHER): Payer: Medicare Other | Admitting: Family Medicine

## 2021-05-08 ENCOUNTER — Encounter: Payer: Self-pay | Admitting: Family Medicine

## 2021-05-08 VITALS — BP 154/85 | HR 54 | Temp 97.3°F | Ht 66.0 in | Wt 125.8 lb

## 2021-05-08 DIAGNOSIS — I10 Essential (primary) hypertension: Secondary | ICD-10-CM | POA: Diagnosis not present

## 2021-05-08 DIAGNOSIS — D692 Other nonthrombocytopenic purpura: Secondary | ICD-10-CM

## 2021-05-08 DIAGNOSIS — M8580 Other specified disorders of bone density and structure, unspecified site: Secondary | ICD-10-CM

## 2021-05-08 DIAGNOSIS — Z7901 Long term (current) use of anticoagulants: Secondary | ICD-10-CM

## 2021-05-08 DIAGNOSIS — R42 Dizziness and giddiness: Secondary | ICD-10-CM | POA: Diagnosis not present

## 2021-05-08 DIAGNOSIS — I480 Paroxysmal atrial fibrillation: Secondary | ICD-10-CM | POA: Diagnosis not present

## 2021-05-08 DIAGNOSIS — E782 Mixed hyperlipidemia: Secondary | ICD-10-CM | POA: Diagnosis not present

## 2021-05-08 DIAGNOSIS — Z1211 Encounter for screening for malignant neoplasm of colon: Secondary | ICD-10-CM | POA: Diagnosis not present

## 2021-05-08 DIAGNOSIS — Z23 Encounter for immunization: Secondary | ICD-10-CM | POA: Diagnosis not present

## 2021-05-08 DIAGNOSIS — R197 Diarrhea, unspecified: Secondary | ICD-10-CM | POA: Diagnosis not present

## 2021-05-08 DIAGNOSIS — H903 Sensorineural hearing loss, bilateral: Secondary | ICD-10-CM

## 2021-05-08 DIAGNOSIS — Z1159 Encounter for screening for other viral diseases: Secondary | ICD-10-CM

## 2021-05-08 DIAGNOSIS — I679 Cerebrovascular disease, unspecified: Secondary | ICD-10-CM | POA: Diagnosis not present

## 2021-05-08 HISTORY — DX: Sensorineural hearing loss, bilateral: H90.3

## 2021-05-08 NOTE — Progress Notes (Signed)
?Subjective  ?Chief Complaint  ?Patient presents with  ? Annual Exam  ?  Pt stated that she has been having diarrhea for the past month but she thinks that it has gotten better. She stopped taking fish oil  ? Hypertension  ? Hyperlipidemia  ? osteopenia  ? Dizziness  ? ? ?HPI: Carly Myers is a 76 y.o. female who presents to [facility] today for a Female Wellness Visit. She also has the concerns and/or needs as listed above in the chief complaint. These will be addressed in addition to the Health Maintenance Visit.  ? ?Wellness Visit: annual visit with health maintenance review and exam without Pap ? ?Health maintenance: Reviewed patient bone density, osteopenia.  Mammogram is current.  She reports she has only gotten annual stool cards annually for colon cancer screening.  No history of colonoscopy.  Now willing to do Cologuard.  No family history of colon cancer.  Eligible for Prevnar 20 today. ?Chronic disease f/u and/or acute problem visit: (deemed necessary to be done in addition to the wellness visit): ?Vertigo: Ongoing but 80% better.  She has been evaluated by neurology.  ENT.  Vestibular testing did show abnormalities.  Sent for physical therapy but had significantly improved.  Using Valium as needed.  But this makes her sleepy.  She is frustrated but overall coping.  It is limiting her exercise.  We will request final notes from [facility] ?Hearing loss noted by audiometry.  Reviewed ENT notes.  ?Complains of 3 to 4-week history of intermittent loose stools.  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  No abdominal pain.  She is changing her diet.  She has taken stool softeners and recently stopped them.  No blood in the stool. ?Hypertension: Elevated today but patient has multiple questions and is quite anxious.  Home blood pressures are always in the low normal range.  She checks frequently. ?Has questions about cerebrovascular disease: Reviewed MRI and neurology findings.   Education given. ?HLD On statin and fish oil. Non fasting today.  ?Has multiple questions about sleep apnea, afib, IBS, gout and ear mri. And blood clots,  ? ?Assessment  ?1. Essential hypertension   ?2. Cerebral vascular disease   ?3. Vertigo   ?4. Sensorineural hearing loss (SNHL) of both ears   ?5. Osteopenia, unspecified location   ?6. Screening for colorectal cancer   ?7. Diarrhea, unspecified type   ?8. PAF (paroxysmal atrial fibrillation) ([place])   ?9. Long term (current) use of anticoagulants   ?10. Mixed hyperlipidemia   ?11. Need for hepatitis C screening test   ?12. Senile purpura ([facility]) Chronic  ? ?  ?Plan  ?Female Wellness Visit: ?Age appropriate Health Maintenance and Prevention measures were discussed with patient. Included topics are cancer screening recommendations, ways to keep healthy (see AVS) including dietary and exercise recommendations, regular eye and dental care, use of seat belts, and avoidance of moderate alcohol use and tobacco use.  Recommend colonoscopy, the patient prefers Cologuard.  Ordered and education given ?BMI: discussed patient's BMI and encouraged positive lifestyle modifications to help get to or maintain a target BMI. ?HM needs and immunizations were addressed and ordered. See below for orders. See HM and immunization section for updates.  Prevnar 20 updated today ?Routine labs and screening tests ordered including cmp, cbc and lipids where appropriate. ?Discussed recommendations regarding Vit D and calcium supplementation (see AVS) ? ?Chronic disease management visit and/or acute problem visit: ?Vertigo, vestibular: Will require I&D notes.  Improved.  Well prevention discussed.  No ?Microvascular cerebrovascular disease: Education given.  Continue secondary prevention ?PAF on Eliquis.  stable ?Hypertension: elevated today but believe this is related to anxiety: Normal by home readings.  Continue home monitoring and beta-blocker low-dose.  25 daily.  Heart rate is  54 ?Hyperlipidemia: Goal LDL less than 70.  Recheck today nonfasting on statin rosuvastatin 40 ?Unclear etiology.  Check pathogen panel.  Stop stool softeners follow-up if not improving. ? ?I spent a total of 52 minutes for this patient encounter. Time spent included preparation, face-to-face counseling with the patient and coordination of care, review of chart and records, and documentation of the encounter. ? ?Follow up: Return in about 6 months (around 11/08/2021) for follow up Hypertension.  ?Orders Placed This Encounter  ?Procedures  ? CBC with Differential/Platelet  ? Comprehensive metabolic panel  ? Lipid panel  ? Magnesium  ? Gastrointestinal Pathogen Panel PCR  ? TSH  ? Cologuard  ? Hepatitis C antibody  ? ?No orders of the defined types were placed in this encounter. ? ?  ? ?Body mass index is 20.3 kg/m?. ?Wt Readings from Last 3 Encounters:  ?05/08/21 125 lb 12.8 oz (57.1 kg)  ?12/12/20 122 lb 12.8 oz (55.7 kg)  ?11/21/20 122 lb 3.2 oz (55.4 kg)  ? ? ? ?Patient Active Problem List  ? Diagnosis Date Noted  ? Vertigo 10/26/2020  ?  Priority: High  ?  Dr. Krista Blue and Dr. Benjamine Mola [phone] ?  ? Cerebral vascular disease 10/26/2020  ?  Priority: High  ?  Brain MRI: small vessel ischemic changes with small remote infarct left cerebellum 2022 ?  ? Essential hypertension 12/28/2019  ?  Priority: High  ? PAF (paroxysmal atrial fibrillation) ([place]) 12/28/2019  ?  Priority: High  ? Long term (current) use of anticoagulants 12/28/2019  ?  Priority: High  ?  PAF ?  ? Mixed hyperlipidemia 03/25/2017  ?  Priority: High  ? Osteopenia 03/25/2017  ?  Priority: Medium   ?  dexa 2022 [facility]: lowest T = -1.8, recheck 3 years ?Failed fosamax in past ?  ? Sensorineural hearing loss (SNHL) of both ears 05/08/2021  ?  Priority: Low  ?  Dr. Benjamine Mola 2022 ?  ? Vitamin deficiency 03/25/2017  ?  Priority: Low  ? Senile purpura ([place]) 03/25/2017  ?  Priority: Low  ? Allergic rhinitis 03/25/2017  ?  Priority: Low  ? ?Health Maintenance  ?Topic Date  Due  ? Hepatitis C Screening  Never done  ? Pneumonia Vaccine 4+ Years old (1 - PCV) Never done  ? COVID-19 Vaccine (3 - Booster for Pfizer series) 01/26/2020  ? INFLUENZA VACCINE  05/24/2021 (Originally 09/24/2020)  ? Zoster Vaccines- Shingrix (1 of 2) 08/08/2021 (Originally 07/21/1993)  ? MAMMOGRAM  08/15/2021  ? DEXA SCAN  08/16/2023  ? HPV VACCINES  Aged Out  ? TETANUS/TDAP  Discontinued  ? ?Immunization History  ?Administered Date(s) Administered  ? PFIZER(Purple Top)SARS-COV-2 Vaccination 04/01/2019, 12/01/2019  ? ?We updated and reviewed the patient's past history in detail and it is documented below. ?Allergies: ?Patient is allergic to bee venom. ?Past Medical History ?Patient  has a past medical history of Allergic rhinitis, Anaphylactic reaction to bee sting, Anxiety state, Arthritis, Disorder of bone, Essential hypertension (12/28/2019), Hypercholesteremia, Hyperlipemia, Long term (current) use of anticoagulants (12/28/2019), Low back pain, Major depression, Osteopenia, Osteoporosis, PAF (paroxysmal atrial fibrillation) ([place]) (12/28/2019), Post-menopausal, Senile purpura ([place]), Sensorineural hearing loss (SNHL) of both ears (05/08/2021), Shingles, Sinusitis, UTI (  urinary tract infection), and Vitamin D deficiency. ?Past Surgical History ?Patient  has a past surgical history that includes No past surgeries. ?Family History: ?Patient family history is not on file. ?Social History:  ?Patient  reports that she quit smoking about 13 years ago. Her smoking use included cigarettes. She has never used smokeless tobacco. She reports current alcohol use. She reports that she does not use drugs. ? ?Review of Systems: ?Constitutional: negative for fever or malaise ?Ophthalmic: negative for photophobia, double vision or loss of vision ?Cardiovascular: negative for chest pain, dyspnea on exertion, or new LE swelling ?Respiratory: negative for SOB or persistent cough ?Gastrointestinal: negative for abdominal pain, change in  bowel habits or melena ?Genitourinary: negative for dysuria or gross hematuria, no abnormal uterine bleeding or disharge ?Musculoskeletal: negative for new gait disturbance or muscular weakness ?Integumentary: negative f

## 2021-05-08 NOTE — Patient Instructions (Addendum)
Please return in 6 months for hypertension follow up.  ? ?I will release your lab results to you on your MyChart account with further instructions. You may see the results before I do, but when I review them I will send you a message with my report or have my assistant call you if things need to be discussed. Please reply to my message with any questions. Thank you!  ? ?I recommend the Cologuard test for your colon cancer screening that is due. I have ordered this test for you. [facility] will soon contact you to verify your insurance, address etc. They will then send you the kit; follow the instructions in the kit and return the kit to [facility]. They will run the test and send the results to me. I will then give you the results. If this test is negative, we recommend repeating a colon cancer screening test in 3 years. If it is positive, I will refer you to a Gastroenterologist so you can get set up for the recommended colonoscopy.  ?Thank you! ? ? ?If you have any questions or concerns, please don't hesitate to send me a message via MyChart or call the office at [phone]. Thank you for visiting with [place] today! It's our pleasure caring for you.  ?

## 2021-05-08 NOTE — Addendum Note (Signed)
Addended by: Loura Back on: 05/08/2021 04:19 PM ? ? Modules accepted: Orders ? ?

## 2021-05-09 ENCOUNTER — Other Ambulatory Visit (INDEPENDENT_AMBULATORY_CARE_PROVIDER_SITE_OTHER): Payer: Medicare Other

## 2021-05-09 DIAGNOSIS — I480 Paroxysmal atrial fibrillation: Secondary | ICD-10-CM

## 2021-05-09 DIAGNOSIS — I679 Cerebrovascular disease, unspecified: Secondary | ICD-10-CM | POA: Diagnosis not present

## 2021-05-09 DIAGNOSIS — R197 Diarrhea, unspecified: Secondary | ICD-10-CM

## 2021-05-09 DIAGNOSIS — E782 Mixed hyperlipidemia: Secondary | ICD-10-CM | POA: Diagnosis not present

## 2021-05-09 DIAGNOSIS — Z1159 Encounter for screening for other viral diseases: Secondary | ICD-10-CM

## 2021-05-09 DIAGNOSIS — I10 Essential (primary) hypertension: Secondary | ICD-10-CM

## 2021-05-09 LAB — COMPREHENSIVE METABOLIC PANEL
ALT: 15 U/L (ref 0–35)
AST: 21 U/L (ref 0–37)
Albumin: 4.1 g/dL (ref 3.5–5.2)
Alkaline Phosphatase: 45 U/L (ref 39–117)
BUN: 16 mg/dL (ref 6–23)
CO2: 28 mEq/L (ref 19–32)
Calcium: 9.5 mg/dL (ref 8.4–10.5)
Chloride: 102 mEq/L (ref 96–112)
Creatinine, Ser: 0.89 mg/dL (ref 0.40–1.20)
GFR: 62.37 mL/min (ref >60.00)
Glucose, Bld: 103 mg/dL — ABNORMAL HIGH (ref 70–99)
Potassium: 4.6 mEq/L (ref 3.5–5.1)
Sodium: 138 mEq/L (ref 135–145)
Total Bilirubin: 0.8 mg/dL (ref 0.2–1.2)
Total Protein: 6.8 g/dL (ref 6.0–8.3)

## 2021-05-09 LAB — CBC WITH DIFFERENTIAL/PLATELET
Basophils Absolute: 0.1 10*3/uL (ref 0.0–0.1)
Basophils Relative: 0.7 % (ref 0.0–3.0)
Eosinophils Absolute: 0.2 10*3/uL (ref 0.0–0.7)
Eosinophils Relative: 2 % (ref 0.0–5.0)
HCT: 40.9 % (ref 36.0–46.0)
Hemoglobin: 13.4 g/dL (ref 12.0–15.0)
Lymphocytes Relative: 30.1 % (ref 12.0–46.0)
Lymphs Abs: 2.6 10*3/uL (ref 0.7–4.0)
MCHC: 32.7 g/dL (ref 30.0–36.0)
MCV: 96.7 fl (ref 78.0–100.0)
Monocytes Absolute: 0.8 10*3/uL (ref 0.1–1.0)
Monocytes Relative: 9.2 % (ref 3.0–12.0)
Neutro Abs: 4.9 10*3/uL (ref 1.4–7.7)
Neutrophils Relative %: 58 % (ref 43.0–77.0)
Platelets: 299 10*3/uL (ref 150.0–400.0)
RBC: 4.23 Mil/uL (ref 3.87–5.11)
RDW: 14.5 % (ref 11.5–15.5)
WBC: 8.5 10*3/uL (ref 4.0–10.5)

## 2021-05-09 LAB — LIPID PANEL
Cholesterol: 158 mg/dL (ref 0–200)
HDL: 62.4 mg/dL (ref >39.00)
LDL Cholesterol: 79 mg/dL (ref 0–99)
NonHDL: 95.73
Total CHOL/HDL Ratio: 3
Triglycerides: 83 mg/dL (ref 0.0–149.0)
VLDL: 16.6 mg/dL (ref 0.0–40.0)

## 2021-05-09 LAB — TSH: TSH: 1.18 u[IU]/mL (ref 0.35–5.50)

## 2021-05-09 LAB — MAGNESIUM: Magnesium: 2 mg/dL (ref 1.5–2.5)

## 2021-05-10 LAB — TIQ- AMBIGUOUS ORDER

## 2021-05-10 LAB — HEPATITIS C ANTIBODY
Hepatitis C Ab: NONREACTIVE
SIGNAL TO CUT-OFF: 0.02 (ref <1.00)

## 2021-05-13 ENCOUNTER — Telehealth: Payer: Self-pay | Admitting: Family Medicine

## 2021-05-13 NOTE — Telephone Encounter (Signed)
Patient requesting a call from office re lab specimen not being able to be complemented.  ?

## 2021-05-14 DIAGNOSIS — Z1212 Encounter for screening for malignant neoplasm of rectum: Secondary | ICD-10-CM | POA: Diagnosis not present

## 2021-05-14 DIAGNOSIS — Z1211 Encounter for screening for malignant neoplasm of colon: Secondary | ICD-10-CM | POA: Diagnosis not present

## 2021-05-22 LAB — COLOGUARD: COLOGUARD: POSITIVE — AB

## 2021-05-23 ENCOUNTER — Other Ambulatory Visit: Payer: Self-pay

## 2021-05-23 DIAGNOSIS — R195 Other fecal abnormalities: Secondary | ICD-10-CM

## 2021-05-23 NOTE — Progress Notes (Signed)
Please call patient: I have reviewed his/her lab results. The cologuard test result returned positive which means there could be an abnormality in the colon like a polyp,precancerous polyp, cancer or other. Therefore, please refer to GI for colonoscopy ; we discussed this would be needed if the cologuard test was positve. thanks

## 2021-05-28 ENCOUNTER — Encounter: Payer: Self-pay | Admitting: Internal Medicine

## 2021-06-10 DIAGNOSIS — Z20822 Contact with and (suspected) exposure to covid-19: Secondary | ICD-10-CM | POA: Diagnosis not present

## 2021-06-12 DIAGNOSIS — Z85828 Personal history of other malignant neoplasm of skin: Secondary | ICD-10-CM | POA: Diagnosis not present

## 2021-06-12 DIAGNOSIS — D1801 Hemangioma of skin and subcutaneous tissue: Secondary | ICD-10-CM | POA: Diagnosis not present

## 2021-06-12 DIAGNOSIS — L820 Inflamed seborrheic keratosis: Secondary | ICD-10-CM | POA: Diagnosis not present

## 2021-06-12 DIAGNOSIS — L570 Actinic keratosis: Secondary | ICD-10-CM | POA: Diagnosis not present

## 2021-06-17 ENCOUNTER — Telehealth: Payer: Self-pay | Admitting: *Deleted

## 2021-06-17 ENCOUNTER — Encounter: Payer: Self-pay | Admitting: Internal Medicine

## 2021-06-17 ENCOUNTER — Ambulatory Visit (INDEPENDENT_AMBULATORY_CARE_PROVIDER_SITE_OTHER): Payer: Medicare Other | Admitting: Internal Medicine

## 2021-06-17 VITALS — BP 102/66 | HR 68 | Ht 66.0 in | Wt 127.5 lb

## 2021-06-17 DIAGNOSIS — R195 Other fecal abnormalities: Secondary | ICD-10-CM | POA: Diagnosis not present

## 2021-06-17 NOTE — Progress Notes (Signed)
? ?Cardiology Office Note ? ? ?Date:  06/19/2021  ? ?ID:  Carly Myers, DOB [birth year], MRN [id number] ? ?PCP:  Leamon Arnt, MD  ?Cardiologist:   Dorris Carnes, MD  ? ?F/U of atrial fibrillation   ?  ?History of Present Illness: ?Carly Myers is a 76 y.o. female with hx of CAD on CT  Chest CT showed Aortic atherosclerosis wiht calcification of LAD noted  The pt also had interimttent afib /flutter on Holter  Started on Toprol XL and Xarelto   Echo done which was normal   ? ?I saw the pt in 2022  She denies palpitations   Breathing is OK   She remains active.    Her biggest complain is vestibular problems   Very unsteady   Has not fallen   Seen by Dr Benjamine Mola   ? ? ?Current Meds  ?Medication Sig  ? Calcium Carb-Cholecalciferol (CALCIUM + D3) 600-200 MG-UNIT TABS Take 1 tablet by mouth daily.  ? Cholecalciferol (VITAMIN D) 2000 units CAPS Take 2,000 Units by mouth daily.  ? Cyanocobalamin (B-12 PO) Take by mouth.  ? diazepam (VALIUM) 5 MG tablet Take 0.5-1 tablets (2.5-5 mg total) by mouth 2 (two) times daily as needed (vertigo).  ? EPINEPHrine 0.3 mg/0.3 mL IJ SOAJ injection Inject 0.3 mg into the muscle once.  ? Magnesium Hydroxide (MAGNESIA PO) Take 1 tablet by mouth daily.  ? metoprolol succinate (TOPROL-XL) 25 MG 24 hr tablet Take 1 tablet (25 mg total) by mouth in the morning and at bedtime.  ? rivaroxaban (XARELTO) 20 MG TABS tablet TAKE 1 TABLET DAILY WITH SUPPER  ? rosuvastatin (CRESTOR) 40 MG tablet TAKE 1 TABLET DAILY  ? ? ? ?Allergies:   Bee venom  ? ?Past Medical History:  ?Diagnosis Date  ? Allergic rhinitis   ? Anaphylactic reaction to bee sting   ? Anxiety state   ? unspecified  ? Arthritis   ? Ortho Dr. Berenice Primas  ? Disorder of bone   ? unspecified  ? Essential hypertension 12/28/2019  ? Hypercholesteremia   ? migrated  ? Hyperlipemia   ? Long term (current) use of anticoagulants 12/28/2019  ? PAF  ? Low back pain   ? 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 Osteopenia   ? Osteoporosis   ? PAF (paroxysmal  atrial fibrillation) ([place]) 12/28/2019  ? Post-menopausal   ? Senile purpura ([place])   ? Sensorineural hearing loss (SNHL) of both ears 05/08/2021  ? Dr. Benjamine Mola 2022  ? Shingles   ? Sinusitis   ? UTI (urinary tract infection)   ? Vitamin D deficiency   ? ? ?Past Surgical History:  ?Procedure Laterality Date  ? NO PAST SURGERIES    ? ? ? ?Social History:  The patient  reports that she quit smoking about 13 years ago. Her smoking use included cigarettes. She has never used smokeless tobacco. She reports current alcohol use. She reports that she does not use drugs.  ? ?Family History:  The patient's family history is not on file.  ? ? ?ROS:  Please see the history of present illness. All other systems are reviewed and  Negative to the above problem except as noted.  ? ? ?PHYSICAL EXAM: ?VS:  BP 132/80   Pulse (!) 58   Ht '5\' 6"'$  (1.676 m)   Wt 126 lb 3.2 oz (57.2 kg)   SpO2 98%   BMI 20.37 kg/m?   ?GEN: Thin 76 yo , in no acute  distress  ?HEENT: normal  ?Neck: JVP not elevated   No carotid bruits, ?Cardiac: RRR  No S3  No murmurs    ?Respiratory:  clear to auscultation bilaterally,  ?GI: soft, nontender, nondistended, + BS  No hepatomegaly  ?MS: no deformity Moving all extremities   ?Skin: warm and dry, no rash ?Neuro:  Strength and sensation are intact ?Psych: euthymic mood, full affect ? ? ?EKG:  EKG is ordered today. SB 58 bpm  ? ? ?Sept 2022  Carotid  ? ?Summary: ?Right Carotid: Velocities in the right ICA are consistent with a 1-39% stenosis. ?Left Carotid: Velocities in the left ICA are consistent with a 1-39% stenosis. ?Vertebrals: Bilateral vertebral arteries demonstrate antegrade flow. ?Subclavians: Normal flow hemodynamics were seen in bilateral subclavian arteries. ?*See table(s) above for measurements and observations. ?  Sept 2022 ? 1. Left ventricular ejection fraction, by estimation, is 55 to 60%. The  ?left ventricle has normal function. The left ventricle has no regional  ?wall motion abnormalities. Left  ventricular diastolic parameters are  ?consistent with Grade I diastolic  ?dysfunction (impaired relaxation).  ? 2. Right ventricular systolic function is normal. The right ventricular  ?size is normal. There is normal pulmonary artery systolic pressure. The  ?estimated right ventricular systolic pressure is 85.4 mmHg.  ? 3. The mitral valve is normal in structure. Mild mitral valve  ?regurgitation. No evidence of mitral stenosis.  ? 4. The aortic valve is grossly normal. Aortic valve regurgitation is not  ?visualized. No aortic stenosis is present.  ? 5. The inferior vena cava is normal in size with greater than 50%  ?respiratory variability, suggesting right atrial pressure of 3 mmHg.  ? ?Conclusion(s)/Recommendation(s): No intracardiac source of embolism  ?detected on this transthoracic study. A transesophageal echocardiogram is  ?recommended to exclude cardiac source of embolism if clinically indicated.  ? ?Lipid Panel ?   ?Component Value Date/Time  ? CHOL 158 05/09/2021 0921  ? CHOL 143 03/28/2019 1407  ? TRIG 83.0 05/09/2021 0921  ? HDL 62.40 05/09/2021 0921  ? HDL 63 03/28/2019 1407  ? CHOLHDL 3 05/09/2021 0921  ? VLDL 16.6 05/09/2021 0921  ? [place] 79 05/09/2021 0921  ? [place] 66 03/28/2019 1407  ? ?  ? ?Wt Readings from Last 3 Encounters:  ?06/19/21 126 lb 3.2 oz (57.2 kg)  ?06/17/21 127 lb 8 oz (57.8 kg)  ?05/08/21 125 lb 12.8 oz (57.1 kg)  ?  ? ? ?ASSESSMENT AND PLAN: ? ?1  PAF  Pt denies palpitations    Keep on Xarelto   ? ?2 CAD Found on CT scan  Remains asymptomatic    ? ?3  HL  Last lipids are OK,  Have been better  LDL 79 bpm  HDL 62  Trig 83 ? ?4  HTN  BP is good    ? ?5  Lung nodule  Continues to get periodic screenings ? ?6  Vestibular   Pt with vestibular problems   Followed in ENT and rehab   Still very unsteady at times  Scared about falling since on blood thinner     ? ?   ?   ? ?Check:  CBC today   ? ?I will set to see in May 2021 ? ? ? ? ?Current medicines are reviewed at length with the  patient today.  The patient does not have concerns regarding medicines. ? ?Signed, ?Dorris Carnes, MD  ?06/19/2021 9:19 AM    ?[facility] ?[address]  [address] [place], [place]  [address] ?Phone: [phone]; Fax: [phone]  ? ? ?

## 2021-06-17 NOTE — Telephone Encounter (Signed)
[facility] Pre-operative Risk Assessment  ?   ?Request for surgical clearance:     Endoscopy Procedure ? ?What type of surgery is being performed?     Colonoscopy ? ?When is this surgery scheduled?     07/25/2021 ? ?What type of clearance is required ?   Pharmacy ? ?Are there any medications that need to be held prior to surgery and how long? Xarelto 2 days ? ?Practice name and name of physician performing surgery?      [facility] ? ?What is your office phone and fax number?      Phone- [phone]  Fax- [phone] ? ?Anesthesia type (None, local, MAC, general) ?       [facility]  ?

## 2021-06-17 NOTE — Progress Notes (Signed)
? ?Chief Complaint: Positive Cologuard ? ?HPI : 76 year old female with history of arthritis and atrial fibrillation on Xarelto presents with positive Cologuard ? ?She went to a dog show in [place] and then had some issues with diarrhea over the last few months. Her loose stools have improved slightly since she got back from the dog show. She quit taking her fish oil, which she is not sure whether or not it was causing her worsened diarrhea. She has had a small amount of fecal incontinence when passing gas. She has about 2 BMs per day on average. Denies melena or hematochezia. Denies fam hx of colon cancer or liver disease. Has fam hx of colon polyps. Has never had a colonoscopy in the past. Years ago she was scheduled for a colonoscopy when she was given a prep that she was not able to tolerate. Since then, she has gotten stool tests regularly for colon cancer screening. She recently had a positive Cologuard test. Denies N&V, dysphagia, ab pain, constipation. On Xarelto, prescribed by her cardiologist Dr. Harrington Challenger. ? ?Wt Readings from Last 3 Encounters:  ?06/17/21 127 lb 8 oz (57.8 kg)  ?05/08/21 125 lb 12.8 oz (57.1 kg)  ?12/12/20 122 lb 12.8 oz (55.7 kg)  ? ?Past Medical History:  ?Diagnosis Date  ? Allergic rhinitis   ? Anaphylactic reaction to bee sting   ? Anxiety state   ? unspecified  ? Arthritis   ? Ortho Dr. Berenice Primas  ? Disorder of bone   ? unspecified  ? Essential hypertension 12/28/2019  ? Hypercholesteremia   ? migrated  ? Hyperlipemia   ? Long term (current) use of anticoagulants 12/28/2019  ? PAF  ? Low back pain   ? 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 Osteopenia   ? Osteoporosis   ? PAF (paroxysmal atrial fibrillation) ([place]) 12/28/2019  ? Post-menopausal   ? Senile purpura ([place])   ? Sensorineural hearing loss (SNHL) of both ears 05/08/2021  ? Dr. Benjamine Mola 2022  ? Shingles   ? Sinusitis   ? UTI (urinary tract infection)   ? Vitamin D deficiency   ? ? ? ?Past Surgical History:  ?Procedure Laterality Date   ? NO PAST SURGERIES    ? ?History reviewed. No pertinent family history. ?Social History  ? ?Tobacco Use  ? Smoking status: Former  ?  Types: Cigarettes  ?  Quit date: 02/25/2008  ?  Years since quitting: 13.3  ? Smokeless tobacco: Never  ?Vaping Use  ? Vaping Use: Never used  ?Substance Use Topics  ? Alcohol use: Yes  ?  Comment: wine-1 drink nightly w/ dinner  ? Drug use: Never  ? ?Current Outpatient Medications  ?Medication Sig Dispense Refill  ? Calcium Carb-Cholecalciferol (CALCIUM + D3) 600-200 MG-UNIT TABS Take 1 tablet by mouth daily.    ? Cholecalciferol (VITAMIN D) 2000 units CAPS Take 2,000 Units by mouth daily.    ? Cyanocobalamin (B-12 PO) Take by mouth.    ? diazepam (VALIUM) 5 MG tablet Take 0.5-1 tablets (2.5-5 mg total) by mouth 2 (two) times daily as needed (vertigo). 30 tablet 1  ? EPINEPHrine 0.3 mg/0.3 mL IJ SOAJ injection Inject 0.3 mg into the muscle once.    ? Magnesium Hydroxide (MAGNESIA PO) Take 1 tablet by mouth daily.    ? metoprolol succinate (TOPROL-XL) 25 MG 24 hr tablet Take 1 tablet (25 mg total) by mouth in the morning and at bedtime. 180 tablet 3  ? rivaroxaban (  XARELTO) 20 MG TABS tablet TAKE 1 TABLET DAILY WITH SUPPER 90 tablet 3  ? rosuvastatin (CRESTOR) 40 MG tablet TAKE 1 TABLET DAILY 90 tablet 3  ? ?No current facility-administered medications for this visit.  ? ?Allergies  ?Allergen Reactions  ? Bee Venom   ? ? ? ?Review of Systems: ?All systems reviewed and negative except where noted in HPI.  ? ?Physical Exam: ?BP 102/66   Pulse 68   Ht '5\' 6"'$  (1.676 m)   Wt 127 lb 8 oz (57.8 kg)   SpO2 98%   BMI 20.58 kg/m?  ?Constitutional: Pleasant,well-developed, female in no acute distress. ?HEENT: Normocephalic and atraumatic. Conjunctivae are normal. No scleral icterus. ?Cardiovascular: Normal rate, regular rhythm.  ?Pulmonary/chest: Effort normal and breath sounds normal. No wheezing, rales or rhonchi. ?Abdominal: Soft, nondistended, nontender. Bowel sounds active throughout.  There are no masses palpable. No hepatomegaly. ?Extremities: No edema ?Neurological: Alert and oriented to person place and time. ?Skin: Skin is warm and dry. No rashes noted. ?Psychiatric: Normal mood and affect. Behavior is normal. ? ?Labs 04/2021: CBC and CMP nml. TSH nml. Positive Cologuard. ? ?ASSESSMENT AND PLAN: ?Positive Cologuard ?Loose stools ?Patient presents with a positive Cologuard in 04/2021. I went over the risks and benefits of the colonoscopy procedure with the patient. Patient is agreeable to proceeding with the colonoscopy. Patient has also had some loose stools recently that have been improving over time. Since she has had some benefit from dietary changes, will have her institute a low FODMAP diet to see if this will help further with her loose stools. ?- Low FODMAP diet ?- Colonoscopy [facility]. Miralax prep. Will contact Dr. Harrington Challenger about holding Xarelto for about 2 days beforehand ?- Consider starting daily fiber supplement in the future ? ?[name], MD ? ?

## 2021-06-17 NOTE — Patient Instructions (Addendum)
You have been scheduled for a colonoscopy. Please follow written instructions given to you at your visit today.  ?Please pick up your prep supplies at the pharmacy within the next 1-3 days. ?If you use inhalers (even only as needed), please bring them with you on the day of your procedure.  ? ?You will be contacted by our office prior to your procedure for directions on holding your XARELTO.  If you do not hear from our office 1 week prior to your scheduled procedure, please call [phone] to discuss. ? ?Due to recent changes in healthcare laws, you may see the results of your imaging and laboratory studies on MyChart before your provider has had a chance to review them.  We understand that in some cases there may be results that are confusing or concerning to you. Not all laboratory results come back in the same time frame and the provider may be waiting for multiple results in order to interpret others.  Please give [place] 48 hours in order for your provider to thoroughly review all the results before contacting the office for clarification of your results.   ? ? ? ? ?Thank you for entrusting me with your care and for choosing [facility], ?Dr. [name]    ?

## 2021-06-18 NOTE — Telephone Encounter (Signed)
Patient with diagnosis of PAF on Xarelto for anticoagulation.   ? ?Procedure: colonoscopy ?Date of procedure: 07/25/21 ? ? ?CHA2DS2-VASc Score = 7  ?This indicates a 11.2% annual risk of stroke. ?The patient's score is based upon: ?CHF History: 0 ?HTN History: 1 ?Diabetes History: 0 ?Stroke History: 2 ?Vascular Disease History: 1 ?Age Score: 2 ?Gender Score: 1 ?  ? ?CrCl 48 mL/min ?Platelet count 299K ? ?Patient will be high risk off anticoagulation.  Possible stroke noted by neuro on 11/15/20. Would recommend holding only 1 day of Xarelto.  If MD requires 2 days, would require approval from Dr Harrington Challenger. ? ?Of note, patient is also on incorrect dose of Xarelto per renal function.  Should be on '15mg'$  once daily ?

## 2021-06-18 NOTE — Telephone Encounter (Signed)
Good moring . just for clarification. The pre op team is asking if truly need more than 1 day hold, we will need to get input from cardiologist as well. We do see the clearance reflects 2 days hold. However if able to do procedure with a 1 day hold is what is being asked.  ?

## 2021-06-18 NOTE — Telephone Encounter (Signed)
Reviewed with pharmacist: Patient's creatinine clearance is  48 ml/min. So recommended dose is '15mg'$  daily.  ?Covering staff, Please let patient know about new Xarelto dose of '15mg'$  qd and send RX to requested pharmacy.  ?I will send clearance to requesting provider, Please reach [place] out if requited to hold more than 1 day then we need to discuss with Dr. Harrington Challenger.  ?

## 2021-06-18 NOTE — Telephone Encounter (Signed)
Pt has appt with Dr. Harrington Challenger 06/19/21 @ 8:40 am. I will forward notes as FYI to requesting office. I will send notes to Dr. Harrington Challenger as well for upcoming appt.  ?

## 2021-06-18 NOTE — Telephone Encounter (Signed)
Per Dr. Dayna Barker: Hi, okay I would recommend 2 days off. Please ask Dr. Harrington Challenger for approval. If she is that high risk, a Lovenox bridge may be required ?

## 2021-06-18 NOTE — Telephone Encounter (Signed)
Dr Lorenso Courier, They want to know if the patient needs more than a 1 day hold even though I requested 2. See Note below  ?

## 2021-06-19 ENCOUNTER — Encounter: Payer: Self-pay | Admitting: Internal Medicine

## 2021-06-19 ENCOUNTER — Ambulatory Visit (INDEPENDENT_AMBULATORY_CARE_PROVIDER_SITE_OTHER): Payer: Medicare Other | Admitting: Internal Medicine

## 2021-06-19 VITALS — BP 132/80 | HR 58 | Ht 66.0 in | Wt 126.2 lb

## 2021-06-19 DIAGNOSIS — I480 Paroxysmal atrial fibrillation: Secondary | ICD-10-CM | POA: Diagnosis not present

## 2021-06-19 MED ORDER — CLENPIQ 10-3.5-12 MG-GM -GM/160ML PO SOLN: ORAL | 0 refills | Status: DC

## 2021-06-19 NOTE — Addendum Note (Signed)
Addended by: Oda Kilts on: 06/19/2021 03:38 PM ? ? Modules accepted: Orders ? ?

## 2021-06-19 NOTE — Patient Instructions (Signed)
Medication Instructions:  ? ?*If you need a refill on your cardiac medications before your next appointment, please call your pharmacy* ? ? ?Lab Work: ? ?If you have labs (blood work) drawn today and your tests are completely normal, you will receive your results only by: ?MyChart Message (if you have MyChart) OR ?A paper copy in the mail ?If you have any lab test that is abnormal or we need to change your treatment, we will call you to review the results. ? ? ?Testing/Procedures: ? ? ? ?Follow-Up: ?At [facility], you and your health needs are our priority.  As part of our continuing mission to provide you with exceptional heart care, we have created designated Provider Care Teams.  These Care Teams include your primary Cardiologist (physician) and Advanced Practice Providers (APPs -  Physician Assistants and Nurse Practitioners) who all work together to provide you with the care you need, when you need it. ? ?We recommend signing up for the patient portal called "MyChart".  Sign up information is provided on this After Visit Summary.  MyChart is used to connect with patients for Virtual Visits (Telemedicine).  Patients are able to view lab/test results, encounter notes, upcoming appointments, etc.  Non-urgent messages can be sent to your provider as well.   ?To learn more about what you can do with MyChart, go to [email or web address].   ? ?Your next appointment:   ?1 year(s) ? ?The format for your next appointment:   ?In Person ? ?Provider:   ?Paula Ross, MD   ? ? ?Other Instructions ? ? ?Important Information About Sugar ? ? ? ? ?  ?

## 2021-06-19 NOTE — Telephone Encounter (Signed)
Called pt back. Told her to hold her blood thinner two days.  She also wants to change her prep to Clenpiq because its less fluid. After a 30 minute conversation she still wanted Clenpiq.  Sent it in and she will check on co-pay and send a message through My Chart if she got it and needs new instructions.....  ?

## 2021-06-27 ENCOUNTER — Encounter: Payer: Self-pay | Admitting: Neurology

## 2021-06-28 ENCOUNTER — Encounter: Payer: Self-pay | Admitting: Internal Medicine

## 2021-07-04 ENCOUNTER — Other Ambulatory Visit: Payer: Self-pay | Admitting: Internal Medicine

## 2021-07-04 DIAGNOSIS — I480 Paroxysmal atrial fibrillation: Secondary | ICD-10-CM

## 2021-07-04 NOTE — Telephone Encounter (Addendum)
Xarelto '20mg'$  refill request received. Pt is 76 years old, weight-57.2kg, Crea-0.89 on 05/09/2021, last seen by Dr. Harrington Challenger on 06/19/2021, Diagnosis-Afib & ?CVA 10/2020 per note, CrCl-47.28m/min; Dose is inappropriate based on dosing criteria.  ? ?Called pt regarding which dose of Xarelto she was taking; on 06/17/2021 there is a medication management note/clearance note on 06/17/2021 by CGerald Stabs Pharmacist and Vin PA that pt should have dose reduced to Xarelto '15mg'$ . Called pt and stated she has not been told to change  ? ?Pt states she has plenty of the '20mg'$  Xarelto tablets and does not need a refill at this. Advised that the dose of Xarelto was due to be changed last month and she stated that would be great (explained the reasoning) and wants to know if she can even have a watchman procedure since she has very bad vestibular issues and she states she is unsteady at times and she is worried about falling. She states she discussed this with Dr. RHarrington Challengerat last visit as well and wants to know if it is an option. Advised will send a message to Dr. RHarrington Challenger  ? ?Received message back from Dr. RHarrington Challengeron 07/06/2021 and it states:  ?RFay Records MD  HMarcos Eke RN ?Her Cr clearance is at borderline    Last Cr the month prior places her Cr Clearance at over 50.  I would keep on current dosing    Eliquis would be same dosing for now  ?Re Watchman-- She can be referred to CLars Mageto discuss with him if she is interested.   He would be person to evalute / assess risk/benefit   ?  ?   ?Will keep current Xarelto dosing per Dr. RHarrington Challengerand send Dr. LQuentin Orea message regarding referring/evaluating for Watchman.  ?Also, will call the pt to update at an appropriate time.  ? ?Called the pt but had to leave a message for the pt to call me back.  ? ?07/08/2021-spoke with pt and advised that she will remain on Xarelto '20mg'$  daily and she verbalized understanding and will remain on current dose. Also, advised pt I sent a message to Dr. LQuentin Ore to evaluate for Watchman, she is aware that I did not have a time frame for when she will be updated and she verbalized understanding.  ?

## 2021-07-15 ENCOUNTER — Other Ambulatory Visit: Payer: Self-pay | Admitting: *Deleted

## 2021-07-15 DIAGNOSIS — Z122 Encounter for screening for malignant neoplasm of respiratory organs: Secondary | ICD-10-CM

## 2021-07-15 DIAGNOSIS — Z87891 Personal history of nicotine dependence: Secondary | ICD-10-CM

## 2021-07-18 ENCOUNTER — Encounter: Payer: Self-pay | Admitting: Internal Medicine

## 2021-07-25 ENCOUNTER — Encounter: Payer: Self-pay | Admitting: Internal Medicine

## 2021-07-25 ENCOUNTER — Ambulatory Visit (AMBULATORY_SURGERY_CENTER): Payer: Medicare Other | Admitting: Internal Medicine

## 2021-07-25 VITALS — BP 128/90 | HR 55 | Temp 97.4°F | Resp 22 | Ht 66.0 in | Wt 127.0 lb

## 2021-07-25 DIAGNOSIS — R195 Other fecal abnormalities: Secondary | ICD-10-CM | POA: Diagnosis not present

## 2021-07-25 DIAGNOSIS — Z1211 Encounter for screening for malignant neoplasm of colon: Secondary | ICD-10-CM | POA: Diagnosis not present

## 2021-07-25 MED ORDER — SODIUM CHLORIDE 0.9 % IV SOLN: 500.0000 mL | Freq: Once | INTRAVENOUS | Status: DC

## 2021-07-25 NOTE — Patient Instructions (Signed)
  OK TO RESTART XARELTO TODAY.   HANDOUT ON HEMORRHOIDS GIVEN.  YOU HAD AN ENDOSCOPIC PROCEDURE TODAY AT [facility]:   Refer to the procedure report that was given to you for any specific questions about what was found during the examination.  If the procedure report does not answer your questions, please call your gastroenterologist to clarify.  If you requested that your care partner not be given the details of your procedure findings, then the procedure report has been included in a sealed envelope for you to review at your convenience later.  YOU SHOULD EXPECT: Some feelings of bloating in the abdomen. Passage of more gas than usual.  Walking can help get rid of the air that was put into your GI tract during the procedure and reduce the bloating. If you had a lower endoscopy (such as a colonoscopy or flexible sigmoidoscopy) you may notice spotting of blood in your stool or on the toilet paper. If you underwent a bowel prep for your procedure, you may not have a normal bowel movement for a few days.  Please Note:  You might notice some irritation and congestion in your nose or some drainage.  This is from the oxygen used during your procedure.  There is no need for concern and it should clear up in a day or so.  SYMPTOMS TO REPORT IMMEDIATELY:  Following lower endoscopy (colonoscopy or flexible sigmoidoscopy):  Excessive amounts of blood in the stool  Significant tenderness or worsening of abdominal pains  Swelling of the abdomen that is new, acute  Fever of 100F or higher   For urgent or emergent issues, a gastroenterologist can be reached at any hour by calling [phone]. Do not use MyChart messaging for urgent concerns.    DIET:  We do recommend a small meal at first, but then you may proceed to your regular diet.  Drink plenty of fluids but you should avoid alcoholic beverages for 24 hours.  ACTIVITY:  You should plan to take it easy for the rest of today and  you should NOT DRIVE or use heavy machinery until tomorrow (because of the sedation medicines used during the test).    FOLLOW UP: Our staff will call the number listed on your records 48-72 hours following your procedure to check on you and address any questions or concerns that you may have regarding the information given to you following your procedure. If we do not reach you, we will leave a message.  We will attempt to reach you two times.  During this call, we will ask if you have developed any symptoms of COVID 19. If you develop any symptoms (ie: fever, flu-like symptoms, shortness of breath, cough etc.) before then, please call [phone].  If you test positive for Covid 19 in the 2 weeks post procedure, please call and report this information to [place].    If any biopsies were taken you will be contacted by phone or by letter within the next 1-3 weeks.  Please call us at [phone] if you have not heard about the biopsies in 3 weeks.    SIGNATURES/CONFIDENTIALITY: You and/or your care partner have signed paperwork which will be entered into your electronic medical record.  These signatures attest to the fact that that the information above on your After Visit Summary has been reviewed and is understood.  Full responsibility of the confidentiality of this discharge information lies with you and/or your care-partner.

## 2021-07-25 NOTE — Op Note (Signed)
[place] Patient Name: Carly Myers Procedure Date: 07/25/2021 8:39 AM MRN: [id number] Endoscopist: Sonny Masters "Carly Myers ,  Age: 76 Referring MD:  Date of Birth: 08-31-1945 Gender: Female Account #: [id number] Procedure:                Colonoscopy Indications:              Positive Cologuard test Medicines:                Monitored Anesthesia Care Procedure:                Pre-Anesthesia Assessment:                           - Prior to the procedure, a History and Physical                            was performed, and patient medications and                            allergies were reviewed. The patient's tolerance of                            previous anesthesia was also reviewed. The risks                            and benefits of the procedure and the sedation                            options and risks were discussed with the patient.                            All questions were answered, and informed consent                            was obtained. Prior Anticoagulants: The patient has                            taken Xarelto (rivaroxaban), last dose was 2 days                            prior to procedure. ASA Grade Assessment: III - A                            patient with severe systemic disease. After                            reviewing the risks and benefits, the patient was                            deemed in satisfactory condition to undergo the                            procedure.  After obtaining informed consent, the colonoscope                            was passed under direct vision. Throughout the                            procedure, the patient's blood pressure, pulse, and                            oxygen saturations were monitored continuously. The                            PCF-HQ190L Colonoscope was introduced through the                            anus and advanced to the the terminal ileum. The                             colonoscopy was performed without difficulty. The                            patient tolerated the procedure well. The quality                            of the bowel preparation was good. The terminal                            ileum, ileocecal valve, appendiceal orifice, and                            rectum were photographed. Scope In: 8:48:28 AM Scope Out: 9:13:20 AM Scope Withdrawal Time: 0 hours 18 minutes 12 seconds  Total Procedure Duration: 0 hours 24 minutes 52 seconds  Findings:                 The terminal ileum appeared normal.                           Non-bleeding internal hemorrhoids were found during                            retroflexion.                           The exam was otherwise without abnormality. Complications:            No immediate complications. Estimated Blood Loss:     Estimated blood loss: none. Impression:               - The examined portion of the ileum was normal.                           - Non-bleeding internal hemorrhoids.                           - The examination was otherwise normal.                           -  No specimens collected. Recommendation:           - Discharge patient to home (with escort).                           - Based on your age and comorbidites, would be okay                            to discontinue colon cancer screening in the future.                           - Okay to restart Xarelto today.                           - The findings and recommendations were discussed                            with the patient. Sonny Masters "Carly Myers,  07/25/2021 9:20:15 AM

## 2021-07-25 NOTE — Progress Notes (Signed)
GASTROENTEROLOGY PROCEDURE H&P NOTE   Primary Care Physician: Leamon Arnt, MD    Reason for Procedure:   Positive Cologuard  Plan:    Colonoscopy  Patient is appropriate for endoscopic procedure(s) in the ambulatory ([facility]) setting.  The nature of the procedure, as well as the risks, benefits, and alternatives were carefully and thoroughly reviewed with the patient. Ample time for discussion and questions allowed. The patient understood, was satisfied, and agreed to proceed.     HPI: Carly Myers is a 76 y.o. female who presents for colonoscopy for evaluation of positive Cologuard .  Patient was most recently seen in the Gastroenterology Clinic on 06/17/21.  No interval change in medical history since that appointment. Please refer to that note for full details regarding GI history and clinical presentation.   Past Medical History:  Diagnosis Date   Allergic rhinitis    Anaphylactic reaction to bee sting    Anxiety state    unspecified   Arthritis    Ortho Dr. 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 Benjamine Mola 2022   Shingles    Sinusitis    UTI (urinary tract infection)    Vitamin D deficiency     Past Surgical History:  Procedure Laterality Date   NO PAST SURGERIES      Prior to Admission medications   Medication Sig Start Date End Date Taking? Authorizing Provider  Calcium Carb-Cholecalciferol (CALCIUM + D3) 600-200 MG-UNIT TABS Take 1 tablet by mouth daily.   Yes [provider]  Cholecalciferol (VITAMIN D) 2000 units CAPS Take 2,000 Units by mouth  daily.   Yes [provider]  Cyanocobalamin (B-12 PO) Take by mouth.   Yes [provider]  Magnesium Hydroxide (MAGNESIA PO) Take 1 tablet by mouth daily.   Yes [provider]  metoprolol succinate (TOPROL-XL) 25 MG 24 hr tablet Take 1 tablet (25 mg total) by mouth in the morning and at bedtime. 12/27/20  Yes [name], MD  rosuvastatin (CRESTOR) 40 MG tablet TAKE 1 TABLET DAILY 01/21/21  Yes [name], MD  Sod Picosulfate-Mag Ox-Cit Acd [facility]) 10-3.5-12 MG-GM -GM/160ML SOLN Per colonoscopy prep instructions per GI Instructions 06/19/21  Yes Sharyn Creamer, MD  diazepam (VALIUM) 5 MG tablet Take 0.5-1 tablets (2.5-5 mg total) by mouth 2 (two) times daily as needed (vertigo). 11/21/20   Leamon Arnt, MD  EPINEPHrine 0.3 mg/0.3 mL IJ SOAJ injection Inject 0.3 mg into the muscle once.    [provider]  rivaroxaban (XARELTO) 20 MG TABS tablet TAKE 1 TABLET DAILY WITH SUPPER 07/08/21   [name], MD    Current Outpatient Medications  Medication Sig Dispense Refill   Calcium Carb-Cholecalciferol (CALCIUM + D3) 600-200 MG-UNIT TABS Take 1 tablet by mouth daily.     Cholecalciferol (VITAMIN D) 2000 units CAPS Take 2,000 Units by mouth daily.     Cyanocobalamin (B-12 PO) Take by mouth.     Magnesium Hydroxide (MAGNESIA PO) Take 1  tablet by mouth daily.     metoprolol succinate (TOPROL-XL) 25 MG 24 hr tablet Take 1 tablet (25 mg total) by mouth in the morning and at bedtime. 180 tablet 3   rosuvastatin (CRESTOR) 40 MG tablet TAKE 1 TABLET DAILY 90 tablet 3   Sod Picosulfate-Mag Ox-Cit Acd (CLENPIQ) 10-3.5-12 MG-GM -GM/160ML SOLN Per colonoscopy prep instructions per GI Instructions 320 mL 0   diazepam (VALIUM) 5 MG tablet Take 0.5-1 tablets (2.5-5 mg total) by mouth 2 (two) times daily as needed (vertigo). 30 tablet 1   EPINEPHrine 0.3 mg/0.3 mL IJ SOAJ injection Inject 0.3 mg into the muscle once.     rivaroxaban (XARELTO) 20 MG TABS tablet TAKE 1  TABLET DAILY WITH SUPPER 90 tablet 1   Current Facility-Administered Medications  Medication Dose Route Frequency Provider Last Rate Last Admin   0.9 %  sodium chloride infusion  500 mL Intravenous Once Sharyn Creamer, MD        Allergies as of 07/25/2021 - Review Complete 07/25/2021  Allergen Reaction Noted   Bee venom  07/27/2017    Family History  Problem Relation Age of Onset   Colon cancer Neg Hx    Esophageal cancer Neg Hx    Rectal cancer Neg Hx    Stomach cancer Neg Hx     Social History   Socioeconomic History   Marital status: Widowed    Spouse name: Not on file   Number of children: 0   Years of education: Not on file   Highest education level: Not on file  Occupational History   Not on file  Tobacco Use   Smoking status: Former    Types: Cigarettes    Quit date: 02/25/2008    Years since quitting: 13.4   Smokeless tobacco: Never  Vaping Use   Vaping Use: Never used  Substance and Sexual Activity   Alcohol use: Yes    Comment: wine-1 drink nightly w/ dinner   Drug use: Never   Sexual activity: Not Currently    Birth control/protection: Post-menopausal  Other Topics Concern   Not on file  Social History Narrative   Right handed   Caffeine use: rare   Social Determinants of Health   Financial Resource Strain: Not on file  Food Insecurity: Not on file  Transportation Needs: Not on file  Physical Activity: Not on file  Stress: Not on file  Social Connections: Not on file  Intimate Partner Violence: Not on file    Physical Exam: Vital signs in last 24 hours: BP (!) 154/78   Pulse 60   Temp (!) 97.4 F (36.3 C)   Ht '5\' 6"'$  (1.676 m)   Wt 127 lb (57.6 kg)   SpO2 98%   BMI 20.50 kg/m  GEN: NAD EYE: Sclerae anicteric ENT: MMM CV: Non-tachycardic Pulm: No increased WOB GI: Soft NEURO:  Alert & Oriented   [name], MD [facility]   07/25/2021 8:43 AM

## 2021-07-25 NOTE — Progress Notes (Signed)
To pacu VSS. Report to [email or web address] 

## 2021-07-26 ENCOUNTER — Telehealth: Payer: Self-pay | Admitting: *Deleted

## 2021-07-26 NOTE — Telephone Encounter (Signed)
  Follow up Call-     07/25/2021    8:05 AM  Call back number  Post procedure Call Back phone  # [phone]  Permission to leave phone message Yes     Patient questions:  Do you have a fever, pain , or abdominal swelling? No. Pain Score  0 *  Have you tolerated food without any problems? Yes.    Have you been able to return to your normal activities? Yes.    Do you have any questions about your discharge instructions: Diet   No. Medications  No. Follow up visit  No.  Do you have questions or concerns about your Care? No.  Actions: * If pain score is 4 or above: No action needed, pain <4.

## 2021-07-31 ENCOUNTER — Ambulatory Visit
Admission: RE | Admit: 2021-07-31 | Discharge: 2021-07-31 | Disposition: A | Discharge status: Home / Self Care | Payer: Medicare Other | Source: Ambulatory Visit | Attending: Acute Care | Admitting: Acute Care

## 2021-07-31 DIAGNOSIS — Z87891 Personal history of nicotine dependence: Secondary | ICD-10-CM

## 2021-07-31 DIAGNOSIS — Z122 Encounter for screening for malignant neoplasm of respiratory organs: Secondary | ICD-10-CM

## 2021-08-02 ENCOUNTER — Telehealth: Payer: Self-pay | Admitting: Acute Care

## 2021-08-02 DIAGNOSIS — Z122 Encounter for screening for malignant neoplasm of respiratory organs: Secondary | ICD-10-CM

## 2021-08-02 DIAGNOSIS — Z87891 Personal history of nicotine dependence: Secondary | ICD-10-CM

## 2021-08-02 NOTE — Telephone Encounter (Signed)
12 month follow up is fine on this patient . 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 Ask her if she would like a pulm consult. If she says yes, please schedule her with one of the docs in a consult slot ( [facility] or MR).Also let her PCP know.  Thanks so much

## 2021-08-05 NOTE — Telephone Encounter (Signed)
Spoke with pt and reviewed lung screening ct results. Pt denies having any issues with her breathing at this time. Pt states she will call us if she feels that she needs to see a pulmonary physician . CT results faxed to PCP. Advised pt that I was unable to order a f/u CT scan for next year due to age restriction on pt's Medicare plan. PT verbalized understanding and will continue f/u with PCP.

## 2021-08-08 ENCOUNTER — Encounter: Payer: Self-pay | Admitting: Cardiology

## 2021-08-08 ENCOUNTER — Ambulatory Visit (INDEPENDENT_AMBULATORY_CARE_PROVIDER_SITE_OTHER): Payer: Medicare Other

## 2021-08-08 ENCOUNTER — Ambulatory Visit (INDEPENDENT_AMBULATORY_CARE_PROVIDER_SITE_OTHER): Payer: Medicare Other | Admitting: Cardiology

## 2021-08-08 VITALS — BP 128/70 | HR 59 | Ht 66.0 in | Wt 124.0 lb

## 2021-08-08 DIAGNOSIS — I480 Paroxysmal atrial fibrillation: Secondary | ICD-10-CM | POA: Diagnosis not present

## 2021-08-08 NOTE — Patient Instructions (Addendum)
Medication Instructions:  Your physician recommends that you continue on your current medications as directed. Please refer to the Current Medication list given to you today.  Labwork: None ordered.  Testing/Procedures: You will wear a 14 day ZIO Monitor.    Follow-Up: Your physician wants you to follow-up in: 4-6 weeks with Lars Mage, MD or one of the following Advanced Practice Providers on your designated Care Team:   Tommye Standard, [place] Legrand Como "Jonni Sanger" Chalmers Cater, [place]   Any Other Special Instructions Will Be Listed Below (If Applicable).  If you need a refill on your cardiac medications before your next appointment, please call your pharmacy.   Your physician has recommended that you wear a Zio monitor.   This monitor is a medical device that records the heart's electrical activity. Doctors most often use these monitors to diagnose arrhythmias. Arrhythmias are problems with the speed or rhythm of the heartbeat. The monitor is a small device applied to your chest. You can wear one while you do your normal daily activities. While wearing this monitor if you have any symptoms to push the button and record what you felt. Once you have worn this monitor for the period of time provider prescribed (Usually 14 days), you will return the monitor device in the postage paid box. Once it is returned they will download the data collected and provide [place] with a report which the provider will then review and we will call you with those results. Important tips:  Avoid showering during the first 24 hours of wearing the monitor. Avoid excessive sweating to help maximize wear time. Do not submerge the device, no hot tubs, and no swimming pools. Keep any lotions or oils away from the patch. After 24 hours you may shower with the patch on. Take brief showers with your back facing the shower head.  Do not remove patch once it has been placed because that will interrupt data and decrease adhesive wear  time. Push the button when you have any symptoms and write down what you were feeling. Once you have completed wearing your monitor, remove and place into box which has postage paid and place in your outgoing mailbox.  If for some reason you have misplaced your box then call our office and we can provide another box and/or mail it off for you.

## 2021-08-08 NOTE — Progress Notes (Signed)
Electrophysiology Office Note:    Date:  08/08/2021   ID:  Carly Myers, DOB 1946-01-09, MRN [id number]  PCP:  Leamon Arnt, MD  [facility] Cardiologist:  Dorris Carnes, MD  [place] Electrophysiologist:  Vickie Epley, MD   Referring MD: Leamon Arnt, MD   Chief Complaint: Consult for watchman  History of Present Illness:    Carly Myers is a 76 y.o. female who presents for an evaluation for discussion of watchman procedure. Their medical history includes paroxysmal atrial fibrillation, CAD, hypertension, hyperlipidemia, arthritis, and major depression.  She was last seen in cardiology by Dr. Harrington Challenger on 06/19/2021. She denied palpitations. She felt very unsteady, and was fearful of falling due to being on anticoagulation.  Today, she reports her vestibular symptoms are constant every day. She had 1 previous fall.  Prior to her colonoscopy she was feeling better, without any arrhythmic episodes for at least a month. Following her recent colonoscopy 07/25/21 she had an episode of arrhythmia with pounding palpitations. Since then her episodes have become more frequent, several days a week. Lately she has also had occasional nocturnal episodes detected by her smart watch. She presents these tracings, which I personally reviewed.  We reviewed the LAAO procedural details at length.   She denies any chest pain, shortness of breath, or peripheral edema. No headaches, syncope, orthopnea, or PND.     Past Medical History:  Diagnosis Date   Allergic rhinitis    Anaphylactic reaction to bee sting    Anxiety state    unspecified   Arthritis    Ortho Dr. Berenice Primas   Disorder of bone    unspecified   Essential hypertension 12/28/2019   Hypercholesteremia    migrated   Hyperlipemia    Long term (current) use of anticoagulants 12/28/2019   PAF   Low back pain    Major depression    in complete remission   Osteopenia    Osteoporosis    PAF (paroxysmal atrial fibrillation) ([place])  12/28/2019   Post-menopausal    Senile purpura ([place])    Sensorineural hearing loss (SNHL) of both ears 05/08/2021   Dr. Benjamine Mola 2022   Shingles    Sinusitis    UTI (urinary tract infection)    Vitamin D deficiency     Past Surgical History:  Procedure Laterality Date   NO PAST SURGERIES      Current Medications: Current Meds  Medication Sig   Calcium Carb-Cholecalciferol (CALCIUM + D3) 600-200 MG-UNIT TABS Take 1 tablet by mouth daily.   Cholecalciferol (VITAMIN D) 2000 units CAPS Take 2,000 Units by mouth daily.   Cyanocobalamin (B-12 PO) Take by mouth.   diazepam (VALIUM) 5 MG tablet Take 0.5-1 tablets (2.5-5 mg total) by mouth 2 (two) times daily as needed (vertigo).   EPINEPHrine 0.3 mg/0.3 mL IJ SOAJ injection Inject 0.3 mg into the muscle once.   Magnesium Hydroxide (MAGNESIA PO) Take 1 tablet by mouth daily.   metoprolol succinate (TOPROL-XL) 25 MG 24 hr tablet Take 1 tablet (25 mg total) by mouth in the morning and at bedtime.   rivaroxaban (XARELTO) 20 MG TABS tablet TAKE 1 TABLET DAILY WITH SUPPER   rosuvastatin (CRESTOR) 40 MG tablet TAKE 1 TABLET DAILY     Allergies:   Bee venom   Social History   Socioeconomic History   Marital status: Widowed    Spouse name: Not on file   Number of children: 0   Years of education: Not on file  Highest education level: Not on file  Occupational History   Not on file  Tobacco Use   Smoking status: Former    Types: Cigarettes    Quit date: 02/25/2008    Years since quitting: 13.4   Smokeless tobacco: Never  Vaping Use   Vaping Use: Never used  Substance and Sexual Activity   Alcohol use: Yes    Comment: wine-1 drink nightly w/ dinner   Drug use: Never   Sexual activity: Not Currently    Birth control/protection: Post-menopausal  Other Topics Concern   Not on file  Social History Narrative   Right handed   Caffeine use: rare   Social Determinants of Health   Financial Resource Strain: Not on file  Food Insecurity:  Not on file  Transportation Needs: Not on file  Physical Activity: Not on file  Stress: Not on file  Social Connections: Not on file     Family History: The patient's family history is negative for Colon cancer, Esophageal cancer, Rectal cancer, and Stomach cancer.  ROS:   Please see the history of present illness.    (+) Imbalance (+) Lightheadedness (+) Palpitations (+) Isolated remote fall All other systems reviewed and are negative.  EKGs/Labs/Other Studies Reviewed:    The following studies were reviewed today:   11/07/2020  Echo:  1. Left ventricular ejection fraction, by estimation, is 55 to 60%. The  left ventricle has normal function. The left ventricle has no regional  wall motion abnormalities. Left ventricular diastolic parameters are  consistent with Grade I diastolic  dysfunction (impaired relaxation).   2. Right ventricular systolic function is normal. The right ventricular  size is normal. There is normal pulmonary artery systolic pressure. The estimated right ventricular systolic pressure is 16.3 mmHg.   3. The mitral valve is normal in structure. Mild mitral valve  regurgitation. No evidence of mitral stenosis.   4. The aortic valve is grossly normal. Aortic valve regurgitation is not visualized. No aortic stenosis is present.   5. The inferior vena cava is normal in size with greater than 50%  respiratory variability, suggesting right atrial pressure of 3 mmHg.   Conclusion(s)/Recommendation(s): No intracardiac source of embolism detected on this transthoracic study. A transesophageal echocardiogram is recommended to exclude cardiac source of embolism if clinically indicated.   04/02/2017  Holter Monitor personally reviewed: Sinus rhythm with rates 53 to 156 bpm  Average HR 80 bpm Occasional PVCs, couplets, triplets Frequent atrial ectopy with PACs, very brief salvos of atrial fib/flutter    EKG:   EKG is personally reviewed.  08/08/2021: Sinus  rhythm   Recent Labs: 05/09/2021: ALT 15; BUN 16; Creatinine, Ser 0.89; Hemoglobin 13.4; Magnesium 2.0; Platelets 299.0; Potassium 4.6; Sodium 138; TSH 1.18   Recent Lipid Panel    Component Value Date/Time   CHOL 158 05/09/2021 0921   CHOL 143 03/28/2019 1407   TRIG 83.0 05/09/2021 0921   HDL 62.40 05/09/2021 0921   HDL 63 03/28/2019 1407   CHOLHDL 3 05/09/2021 0921   VLDL 16.6 05/09/2021 0921   LDLCALC 79 05/09/2021 0921   LDLCALC 66 03/28/2019 1407    Physical Exam:    VS:  BP 128/70   Pulse (!) 59   Ht '5\' 6"'$  (1.676 m)   Wt 124 lb (56.2 kg)   SpO2 97%   BMI 20.01 kg/m     Wt Readings from Last 3 Encounters:  08/08/21 124 lb (56.2 kg)  07/25/21 127 lb (57.6 kg)  06/19/21 126  lb 3.2 oz (57.2 kg)     GEN: Well nourished, well developed in no acute distress.  Appears younger than stated age 4: Normal NECK: No JVD; No carotid bruits LYMPHATICS: No lymphadenopathy CARDIAC: RRR, no murmurs, rubs, gallops RESPIRATORY:  Clear to auscultation without rales, wheezing or rhonchi  ABDOMEN: Soft, non-tender, non-distended MUSCULOSKELETAL:  No edema; No deformity  SKIN: Warm and dry NEUROLOGIC:  Alert and oriented x 3 PSYCHIATRIC:  Normal affect       ASSESSMENT:    1. PAF (paroxysmal atrial fibrillation) (HCC)    PLAN:    In order of problems listed above:  #Paroxysmal atrial fibrillation Referred to discuss left atrial appendage occlusion as a mechanism to avoid long-term exposure to anticoagulation.  The patient has no history of severe bleeding but would like to avoid anticoagulation use long-term given her gait instability which I think is reasonable.  The patient also asks about management of her atrial fibrillation.  It is unclear to me at this point exactly how much atrial fibrillation she is having.  All recent EKGs demonstrate sinus rhythm.  She has a remote Holter monitor which showed brief salvos of atrial tachycardia/PACs and possibly atrial  fibrillation.  I would like to start with a 2-week ZIO monitor to more formally assess her burden of atrial fibrillation.    I will plan to see her back in clinic after she wears the 2-week ZIO monitor to review the results and to discuss neck steps.  If she does have atrial fibrillation, I think it is reasonable to consider catheter ablation to treat the atrial fibrillation followed by left atrial appendage occlusion as a strategy to avoid long-term exposure anticoagulation.  Her CHA2DS2-VASc is at least 3 for age, gender and vascular disease (coronary artery disease and aortic atherosclerosis).  --------------------------------  I have seen Elizebeth Brooking in the office today who is being considered for a Watchman left atrial appendage closure device. I believe they will benefit from this procedure given their history of atrial fibrillation, CHA2DS2-VASc score of 7. Unfortunately, the patient is not felt to be a long term anticoagulation candidate secondary to gait instability. The patient's chart has been reviewed and I feel that they would be a candidate for short term oral anticoagulation after Watchman implant.   It is my belief that after undergoing a LAA closure procedure, Cerria Randhawa will not need long term anticoagulation which eliminates anticoagulation side effects and major bleeding risk.   Procedural risks for the Watchman implant have been reviewed with the patient including a 0.5% risk of stroke, <1% risk of perforation and <1% risk of device embolization. Other risks include bleeding, vascular damage, tamponade, worsening renal function, and death. The patient understands these risks.   The published clinical data on the safety and effectiveness of WATCHMAN include but are not limited to the following: - Holmes DR, Mechele Claude, Sick P et al. for the PROTECT AF Investigators. Percutaneous closure of the left atrial appendage versus warfarin therapy for prevention of stroke in patients with  atrial fibrillation: a randomised non-inferiority trial. Lancet 2009; 374: 534-42. Mechele Claude, Doshi SK, Abelardo Diesel D et al. on behalf of the PROTECT AF Investigators. Percutaneous Left Atrial Appendage Closure for Stroke Prophylaxis in Patients With Atrial Fibrillation 2.3-Year Follow-up of the PROTECT AF (Watchman Left Atrial Appendage System for Embolic Protection in Patients With Atrial Fibrillation) Trial. Circulation 2013; 127:720-729. - Alli O, Doshi S,  Kar S, Reddy VY, Sievert H et al. Georgiana Spinner  of Life Assessment in the Randomized PROTECT AF (Percutaneous Closure of the Left Atrial Appendage Versus Warfarin Therapy for Prevention of Stroke in Patients With Atrial Fibrillation) Trial of Patients at Risk for Stroke With Nonvalvular Atrial Fibrillation. J Am Coll Cardiol 2013; 81:1572-6. Vertell Limber DR, Tarri Abernethy, Price M, Butler, Sievert H, Doshi S, Huber K, Reddy V. Prospective randomized evaluation of the Watchman left atrial appendage Device in patients with atrial fibrillation versus long-term warfarin therapy; the PREVAIL trial. Journal of the [employer] of Cardiology, Vol. 4, No. 1, 2014, 1-11. - Kar S, Doshi SK, Sadhu A, Horton R, Osorio J et al. Primary outcome evaluation of a next-generation left atrial appendage closure device: results from the PINNACLE FLX trial. Circulation 2021;143(18)1754-1762.   HAS-BLED score 2 Hypertension - n Abnormal renal and liver function (Dialysis, transplant, Cr >2.26 mg/dL /Cirrhosis or Bilirubin >2x Normal or AST/ALT/AP >3x Normal) No  Stroke Yes  Bleeding No  Labile INR (Unstable/high INR) No  Elderly (>65) y Drugs or alcohol (? 8 drinks/week, anti-plt or NSAID) n  CHA2DS2-VASc Score = 7  The patient's score is based upon: CHF History: 0 HTN History: 1 Diabetes History: 0 Stroke History: 2 Vascular Disease History: 1 Age Score: 2 Gender Score: 1  Follow-up in 4-6 weeks.  Total time spent with patient today 60 minutes. This  includes reviewing records, evaluating the patient and coordinating care.  Medication Adjustments/Labs and Tests Ordered: Current medicines are reviewed at length with the patient today.  Concerns regarding medicines are outlined above.  Orders Placed This Encounter  Procedures   LONG TERM MONITOR (3-14 DAYS)   EKG 12-Lead   No orders of the defined types were placed in this encounter.   I,Mathew Stumpf,acting as a Education administrator for Vickie Epley, MD.,have documented all relevant documentation on the behalf of Vickie Epley, MD,as directed by  Vickie Epley, MD while in the presence of Vickie Epley, MD.  I, Vickie Epley, MD, have reviewed all documentation for this visit. The documentation on 08/08/21 for the exam, diagnosis, procedures, and orders are all accurate and complete.   Signed, Hilton Cork. Quentin Ore, MD, [facility], [facility] 08/08/2021 8:11 PM    Electrophysiology  [facility]

## 2021-08-08 NOTE — Progress Notes (Unsigned)
Enrolled patient for a 14 day Zio XT  monitor to be mailed to patients home  °

## 2021-08-14 DIAGNOSIS — I480 Paroxysmal atrial fibrillation: Secondary | ICD-10-CM | POA: Diagnosis not present

## 2021-08-22 DIAGNOSIS — M1712 Unilateral primary osteoarthritis, left knee: Secondary | ICD-10-CM | POA: Diagnosis not present

## 2021-08-22 DIAGNOSIS — M19072 Primary osteoarthritis, left ankle and foot: Secondary | ICD-10-CM | POA: Diagnosis not present

## 2021-09-03 DIAGNOSIS — I480 Paroxysmal atrial fibrillation: Secondary | ICD-10-CM | POA: Diagnosis not present

## 2021-09-04 ENCOUNTER — Encounter: Payer: Self-pay | Admitting: Family Medicine

## 2021-09-04 DIAGNOSIS — Z1231 Encounter for screening mammogram for malignant neoplasm of breast: Secondary | ICD-10-CM | POA: Diagnosis not present

## 2021-09-04 LAB — HM MAMMOGRAPHY

## 2021-09-06 ENCOUNTER — Ambulatory Visit: Payer: Medicare Other | Admitting: Physician Assistant

## 2021-09-24 DIAGNOSIS — M1712 Unilateral primary osteoarthritis, left knee: Secondary | ICD-10-CM | POA: Diagnosis not present

## 2021-09-29 NOTE — Progress Notes (Deleted)
Electrophysiology Office Follow up Visit Note:    Date:  09/29/2021   ID:  Carly Myers, DOB 1946-01-04, MRN [id number]  PCP:  Leamon Arnt, MD  [facility] Cardiologist:  Dorris Carnes, MD  [facility] Electrophysiologist:  Vickie Epley, MD    Interval History:    Carly Myers is a 76 y.o. female who presents for a follow up visit. They were last seen in clinic 08/08/2021 for pAF. She was initially referred to me to discuss LAAO given gait instability and risks of falling/bleeding. At the last appointment I recommended a Zio monitor to better assess her AF burden prior to finalizing the plan.        Past Medical History:  Diagnosis Date   Allergic rhinitis    Anaphylactic reaction to bee sting    Anxiety state    unspecified   Arthritis    Ortho Dr. Berenice Primas   Disorder of bone    unspecified   Essential hypertension 12/28/2019   Hypercholesteremia    migrated   Hyperlipemia    Long term (current) use of anticoagulants 12/28/2019   PAF   Low back pain    Major depression    in complete remission   Osteopenia    Osteoporosis    PAF (paroxysmal atrial fibrillation) ([place]) 12/28/2019   Post-menopausal    Senile purpura ([place])    Sensorineural hearing loss (SNHL) of both ears 05/08/2021   Dr. Benjamine Mola 2022   Shingles    Sinusitis    UTI (urinary tract infection)    Vitamin D deficiency     Past Surgical History:  Procedure Laterality Date   NO PAST SURGERIES      Current Medications: No outpatient medications have been marked as taking for the 09/30/21 encounter (Appointment) with Vickie Epley, MD.     Allergies:   Bee venom   Social History   Socioeconomic History   Marital status: Widowed    Spouse name: Not on file   Number of children: 0   Years of education: Not on file   Highest education level: Not on file  Occupational History   Not on file  Tobacco Use   Smoking status: Former    Types: Cigarettes    Quit date: 02/25/2008    Years since  quitting: 13.6   Smokeless tobacco: Never  Vaping Use   Vaping Use: Never used  Substance and Sexual Activity   Alcohol use: Yes    Comment: wine-1 drink nightly w/ dinner   Drug use: Never   Sexual activity: Not Currently    Birth control/protection: Post-menopausal  Other Topics Concern   Not on file  Social History Narrative   Right handed   Caffeine use: rare   Social Determinants of Health   Financial Resource Strain: Not on file  Food Insecurity: Not on file  Transportation Needs: Not on file  Physical Activity: Not on file  Stress: Not on file  Social Connections: Not on file     Family History: The patient's family history is negative for Colon cancer, Esophageal cancer, Rectal cancer, and Stomach cancer.  ROS:   Please see the history of present illness.    All other systems reviewed and are negative.  EKGs/Labs/Other Studies Reviewed:    The following studies were reviewed today:  09/04/2021 Zio monitor HR 41 - 176 bpm, average 69 bpm. 1 nonsustained VT lasting 14 beats with an average rate 105 bpm. 9% AF burden with ventricular rates 61 -  176 bpm. Occasional supraventricular 4.8% Rare ventricular ectopy.  Symptom triggered episodes correspond to AF.    Recent Labs: 05/09/2021: ALT 15; BUN 16; Creatinine, Ser 0.89; Hemoglobin 13.4; Magnesium 2.0; Platelets 299.0; Potassium 4.6; Sodium 138; TSH 1.18  Recent Lipid Panel    Component Value Date/Time   CHOL 158 05/09/2021 0921   CHOL 143 03/28/2019 1407   TRIG 83.0 05/09/2021 0921   HDL 62.40 05/09/2021 0921   HDL 63 03/28/2019 1407   CHOLHDL 3 05/09/2021 0921   VLDL 16.6 05/09/2021 0921   LDLCALC 79 05/09/2021 0921   LDLCALC 66 03/28/2019 1407    Physical Exam:    VS:  There were no vitals taken for this visit.    Wt Readings from Last 3 Encounters:  08/08/21 124 lb (56.2 kg)  07/25/21 127 lb (57.6 kg)  06/19/21 126 lb 3.2 oz (57.2 kg)     GEN: *** Well nourished, well developed in no  acute distress HEENT: Normal NECK: No JVD; No carotid bruits LYMPHATICS: No lymphadenopathy CARDIAC: ***RRR, no murmurs, rubs, gallops RESPIRATORY:  Clear to auscultation without rales, wheezing or rhonchi  ABDOMEN: Soft, non-tender, non-distended MUSCULOSKELETAL:  No edema; No deformity  SKIN: Warm and dry NEUROLOGIC:  Alert and oriented x 3 PSYCHIATRIC:  Normal affect        ASSESSMENT:    No diagnosis found. PLAN:    In order of problems listed above:  #pAF Symptomatic. 9% AF burden on recent monitor with fairly high burden of supraventricular ectopy as well.   ----------  Discussed treatment options today for his AF including antiarrhythmic drug therapy and ablation. Discussed risks, recovery and likelihood of success. Discussed potential need for repeat ablation procedures and antiarrhythmic drugs after an initial ablation. They wish to proceed with scheduling.  Risk, benefits, and alternatives to EP study and radiofrequency ablation for afib were also discussed in detail today. These risks include but are not limited to stroke, bleeding, vascular damage, tamponade, perforation, damage to the esophagus, lungs, and other structures, pulmonary vein stenosis, worsening renal function, and death. The patient understands these risk and wishes to proceed.  We will therefore proceed with catheter ablation at the next available time.  Carto, ICE, anesthesia are requested for the procedure.  Will also obtain CT PV protocol prior to the procedure to exclude LAA thrombus and further evaluate atrial anatomy.  --------------  I have seen Carly Myers in the office today who is being considered for a Watchman left atrial appendage closure device. I believe they will benefit from this procedure given their history of atrial fibrillation, CHA2DS2-VASc score of 7. Unfortunately, the patient is not felt to be a long term anticoagulation candidate secondary to gait instability. The patient's chart  has been reviewed and I feel that they would be a candidate for short term oral anticoagulation after Watchman implant.    It is my belief that after undergoing a LAA closure procedure, Carly Myers will not need long term anticoagulation which eliminates anticoagulation side effects and major bleeding risk.    Procedural risks for the Watchman implant have been reviewed with the patient including a 0.5% risk of stroke, <1% risk of perforation and <1% risk of device embolization. Other risks include bleeding, vascular damage, tamponade, worsening renal function, and death. The patient understands these risks.     The published clinical data on the safety and effectiveness of WATCHMAN include but are not limited to the following: - Holmes DR, Mechele Claude, Sick P et al.  for the PROTECT AF Investigators. Percutaneous closure of the left atrial appendage versus warfarin therapy for prevention of stroke in patients with atrial fibrillation: a randomised non-inferiority trial. Lancet 2009; 374: 534-42. Mechele Claude, Doshi SK, Abelardo Diesel D et al. on behalf of the PROTECT AF Investigators. Percutaneous Left Atrial Appendage Closure for Stroke Prophylaxis in Patients With Atrial Fibrillation 2.3-Year Follow-up of the PROTECT AF (Watchman Left Atrial Appendage System for Embolic Protection in Patients With Atrial Fibrillation) Trial. Circulation 2013; 127:720-729. - Alli O, Doshi S,  Kar S, Reddy VY, Sievert H et al. Quality of Life Assessment in the Randomized PROTECT AF (Percutaneous Closure of the Left Atrial Appendage Versus Warfarin Therapy for Prevention of Stroke in Patients With Atrial Fibrillation) Trial of Patients at Risk for Stroke With Nonvalvular Atrial Fibrillation. J Am Coll Cardiol 2013; 93:2671-2. Vertell Limber DR, Tarri Abernethy, Price M, [place], Sievert H, Doshi S, Huber K, Reddy V. Prospective randomized evaluation of the Watchman left atrial appendage Device in patients with atrial fibrillation versus  long-term warfarin therapy; the PREVAIL trial. Journal of the [employer] of Cardiology, Vol. 4, No. 1, 2014, 1-11. - Kar S, Doshi SK, Sadhu A, Horton R, Osorio J et al. Primary outcome evaluation of a next-generation left atrial appendage closure device: results from the PINNACLE FLX trial. Circulation 2021;143(18)1754-1762.    HAS-BLED score 2 Hypertension - n Abnormal renal and liver function (Dialysis, transplant, Cr >2.26 mg/dL /Cirrhosis or Bilirubin >2x Normal or AST/ALT/AP >3x Normal) No  Stroke Yes  Bleeding No  Labile INR (Unstable/high INR) No  Elderly (>65) y Drugs or alcohol (? 8 drinks/week, anti-plt or NSAID) n   CHA2DS2-VASc Score = 7  The patient's score is based upon: CHF History: 0 HTN History: 1 Diabetes History: 0 Stroke History: 2 Vascular Disease History: 1 Age Score: 2 Gender Score: 1     Medication Adjustments/Labs and Tests Ordered: Current medicines are reviewed at length with the patient today.  Concerns regarding medicines are outlined above.  No orders of the defined types were placed in this encounter.  No orders of the defined types were placed in this encounter.    Signed, Lars Mage, MD, [facility], [facility] 09/29/2021 3:32 PM    Electrophysiology  [facility]

## 2021-09-30 ENCOUNTER — Ambulatory Visit (INDEPENDENT_AMBULATORY_CARE_PROVIDER_SITE_OTHER): Payer: Medicare Other | Admitting: Cardiology

## 2021-09-30 ENCOUNTER — Encounter: Payer: Self-pay | Admitting: Cardiology

## 2021-09-30 VITALS — BP 132/74 | HR 65 | Ht 66.0 in | Wt 127.8 lb

## 2021-09-30 DIAGNOSIS — I10 Essential (primary) hypertension: Secondary | ICD-10-CM

## 2021-09-30 DIAGNOSIS — R269 Unspecified abnormalities of gait and mobility: Secondary | ICD-10-CM | POA: Diagnosis not present

## 2021-09-30 DIAGNOSIS — Z01818 Encounter for other preprocedural examination: Secondary | ICD-10-CM

## 2021-09-30 DIAGNOSIS — I480 Paroxysmal atrial fibrillation: Secondary | ICD-10-CM | POA: Diagnosis not present

## 2021-09-30 NOTE — Patient Instructions (Signed)
Medication Instructions:  none *If you need a refill on your cardiac medications before your next appointment, please call your pharmacy*   Lab Work: none If you have labs (blood work) drawn today and your tests are completely normal, you will receive your results only by: [place] (if you have MyChart) OR A paper copy in the mail If you have any lab test that is abnormal or we need to change your treatment, we will call you to review the results.   Testing/Procedures: Your physician has requested that you have an echocardiogram. Echocardiography is a painless test that uses sound waves to create images of your heart. It provides your doctor with information about the size and shape of your heart and how well your heart's chambers and valves are working. This procedure takes approximately one hour. There are no restrictions for this procedure.  Your physician has requested that you have cardiac CT. Cardiac computed tomography (CT) is a painless test that uses an x-ray machine to take clear, detailed pictures of your heart. For further information please visit [email or web address]. Please follow instruction sheet as given.  Your physician has recommended that you have an ablation. Catheter ablation is a medical procedure used to treat some cardiac arrhythmias (irregular heartbeats). During catheter ablation, a long, thin, flexible tube is put into a blood vessel in your groin (upper thigh), or neck. This tube is called an ablation catheter. It is then guided to your heart through the blood vessel. Radio frequency waves destroy small areas of heart tissue where abnormal heartbeats may cause an arrhythmia to start. Please see the instruction sheet given to you today.    Follow-Up: At [facility], you and your health needs are our priority.  As part of our continuing mission to provide you with exceptional heart care, we have created designated Provider Care Teams.  These Care Teams  include your primary Cardiologist (physician) and Advanced Practice Providers (APPs -  Physician Assistants and Nurse Practitioners) who all work together to provide you with the care you need, when you need it.  We recommend signing up for the patient portal called "MyChart".  Sign up information is provided on this After Visit Summary.  MyChart is used to connect with patients for Virtual Visits (Telemedicine).  Patients are able to view lab/test results, encounter notes, upcoming appointments, etc.  Non-urgent messages can be sent to your provider as well.   To learn more about what you can do with MyChart, go to [email or web address].    Your next appointment:   Ablation date picked Nov 30 and pre op lab date picked Nov 9. Otila Kluver RN will call you will CT and Ablation instructions.   Lenice Llamas, the Watchman Nurse Navigator, will call you after your CT once the [facility] Team has reviewed your imaging for an update on proceedings. [place]'s direct number is [phone] if you need assistance.   Other Instructions Cardiac Ablation Cardiac ablation is a procedure to destroy (ablate) some heart tissue that is sending bad signals. These bad signals cause problems in heart rhythm. The heart has many areas that make these signals. If there are problems in these areas, they can make the heart beat in a way that is not normal. Destroying some tissues can help make the heart rhythm normal. Tell your doctor about: Any allergies you have. All medicines you are taking. These include vitamins, herbs, eye drops, creams, and over-the-counter medicines. Any problems you or family members have had with medicines that make you  fall asleep (anesthetics). Any blood disorders you have. Any surgeries you have had. Any medical conditions you have, such as kidney failure. Whether you are pregnant or may be pregnant. What are the risks? This is a safe procedure. But problems may occur, including: Infection. Bruising  and bleeding. Bleeding into the chest. Stroke or blood clots. Damage to nearby areas of your body. Allergies to medicines or dyes. The need for a pacemaker if the normal system is damaged. Failure of the procedure to treat the problem. What happens before the procedure? Medicines Ask your doctor about: Changing or stopping your normal medicines. This is important. Taking aspirin and ibuprofen. Do not take these medicines unless your doctor tells you to take them. Taking other medicines, vitamins, herbs, and supplements. General instructions Follow instructions from your doctor about what you cannot eat or drink. Plan to have someone take you home from the hospital or clinic. If you will be going home right after the procedure, plan to have someone with you for 24 hours. Ask your doctor what steps will be taken to prevent infection. What happens during the procedure?  An IV tube will be put into one of your veins. You will be given a medicine to help you relax. The skin on your neck or groin will be numbed. A cut (incision) will be made in your neck or groin. A needle will be put through your cut and into a large vein. A tube (catheter) will be put into the needle. The tube will be moved to your heart. Dye may be put through the tube. This helps your doctor see your heart. Small devices (electrodes) on the tube will send out signals. A type of energy will be used to destroy some heart tissue. The tube will be taken out. Pressure will be held on your cut. This helps stop bleeding. A bandage will be put over your cut. The exact procedure may vary among doctors and hospitals. What happens after the procedure? You will be watched until you leave the hospital or clinic. This includes checking your heart rate, breathing rate, oxygen, and blood pressure. Your cut will be watched for bleeding. You will need to lie still for a few hours. Do not drive for 24 hours or as long as your doctor  tells you. Summary Cardiac ablation is a procedure to destroy some heart tissue. This is done to treat heart rhythm problems. Tell your doctor about any medical conditions you may have. Tell him or her about all medicines you are taking to treat them. This is a safe procedure. But problems may occur. These include infection, bruising, bleeding, and damage to nearby areas of your body. Follow what your doctor tells you about food and drink. You may also be told to change or stop some of your medicines. After the procedure, do not drive for 24 hours or as long as your doctor tells you. This information is not intended to replace advice given to you by your health care provider. Make sure you discuss any questions you have with your health care provider. Document Revised: 05/03/2021 Document Reviewed: 01/13/2019 Elsevier Patient Education  [place].  Left Atrial Appendage Closure Device Implantation  Left atrial appendage (LAA) closure device implantation is a procedure to put a small device in the LAA of the heart. The LAA is a small sac in the wall of the heart's left upper chamber. Blood clots can form in the LAA in people with atrial fibrillation (AFib). The device  closes the LAA to help prevent a blood clot and stroke. AFib is a type of irregular or rapid heartbeat (arrhythmia). There is an increased risk of blood clots and stroke with AFib. This procedure helps to reduce that risk. Tell a health care provider about: Any allergies you have. All medicines you are taking, including vitamins, herbs, eye drops, creams, and over-the-counter medicines. Any problems you or family members have had with anesthetic medicines. Any blood disorders you have. Any surgeries you have had. Any medical conditions you have. Whether you are pregnant or may be pregnant. What are the risks? Generally, this is a safe procedure. However, problems may occur, including: Infection. Bleeding. Allergic  reactions to medicines or dyes. Damage to nearby structures or organs. Heart attack. Stroke. Blood clots. Changes in heart rhythm. Device failure. What happens before the procedure? Staying hydrated Follow instructions from your health care provider about hydration, which may include: Up to 2 hours before the procedure - you may continue to drink clear liquids, such as water, clear fruit juice, black coffee, and plain tea. Eating and drinking restrictions Follow instructions from your health care provider about eating and drinking, which may include: 8 hours before the procedure - stop eating heavy meals or foods, such as meat, fried foods, or fatty foods. 6 hours before the procedure - stop eating light meals or foods, such as toast or cereal. 6 hours before the procedure - stop drinking milk or drinks that contain milk. 2 hours before the procedure - stop drinking clear liquids. Medicines Ask your health care provider about: Changing or stopping your regular medicines. This is especially important if you are taking diabetes medicines or blood thinners. Taking medicines such as aspirin and ibuprofen. These medicines can thin your blood. Do not take these medicines unless your health care provider tells you to take them. Taking over-the-counter medicines, vitamins, herbs, and supplements. Tests You may have blood tests and a physical exam. You may have an electrocardiogram (ECG). This test checks your heart's electrical patterns and rhythms. General instructions Do not use any products that contain nicotine or tobacco. These include cigarettes, chewing tobacco, and vaping devices, such as e-cigarettes. If you need help quitting, ask your health care provider. Ask your health care provider what steps will be taken to help prevent infection. These steps may include: Removing hair at the surgery site. Washing skin with a germ-killing soap. Taking antibiotic medicine. Plan to have a  responsible adult take you home from the hospital or clinic. Plan to have a responsible adult care for you for the time you are told after you leave the hospital or clinic. This is important. What happens during the procedure? An IV will be inserted into one of your veins. You will be given one or more of the following: A medicine to help you relax (sedative). A medicine to make you fall asleep (general anesthetic). A small incision will be made in your groin area. A small wire will be put through the incision and into a blood vessel. Dye may be injected so X-rays can be used to guide the wire through the blood vessel. A long, thin tube (catheter) will be put over the small wire and moved up through the blood vessel to reach your heart. The closure device will be moved through the catheter until it reaches your heart. A small hole will be made in the septum (transseptal puncture). The septum is a thin tissue that separates the upper two chambers of the heart.  The device will be placed so that it closes the LAA. X-rays will be done to make sure the device is in the right place. The catheter and wire will be removed. The closure device will remain in your heart. After pressure is applied over the catheter site to prevent bleeding, a bandage (dressing) will be placed over the site where the catheter was inserted. The procedure may vary among health care providers and hospitals. What happens after the procedure? Your blood pressure, heart rate, breathing rate, and blood oxygen level will be monitored until you leave the hospital or clinic. You may have to wear compression stockings. These stockings help to prevent blood clots and reduce swelling in your legs. If you were given a sedative during the procedure, it can affect you for several hours. Do not drive or operate machinery until your health care provider says it is safe. You may be given pain medicine. You may need to drink more fluids to  wash (flush) the dye out of your body. Drink enough fluid to keep your urine pale yellow. Take over-the-counter and prescription medicines only as told by your health care provider. This is especially important if you were given blood thinners. Summary Left atrial appendage (LAA) closure device implantation is a procedure that is done to put a small device in the LAA of the heart. The LAA is a small sac in the wall of the heart's left upper chamber. The device closes the LAA to prevent stroke and other problems. Follow instructions from your health care provider before and after the procedure. This information is not intended to replace advice given to you by your health care provider. Make sure you discuss any questions you have with your health care provider. Document Revised: 10/20/2019 Document Reviewed: 10/20/2019 Elsevier Patient Education  [place]

## 2021-09-30 NOTE — Progress Notes (Signed)
Electrophysiology Office Follow up Visit Note:    Date:  09/30/2021   ID:  Carly Myers, DOB August 03, 1945, MRN [id number]  PCP:  Leamon Arnt, MD  [facility] Cardiologist:  Dorris Carnes, MD  [facility] Electrophysiologist:  Vickie Epley, MD    Interval History:    Carly Myers is a 76 y.o. female who presents for a follow up visit. They were last seen in clinic 08/08/2021 for pAF. She was initially referred to me to discuss LAAO given gait instability and risks of falling/bleeding. At the last appointment I recommended a Zio monitor to better assess her AF burden prior to finalizing the plan.   Today, we reviewed the results of her Zio monitor. She has been concerned about the finding of ventricular tachycardia, which we discussed. She confirms that during her recorded event while wearing the monitor, she had been feeling rapid heart palpitations; this corresponded with atrial fibrillation. Usually this seems to occur while she is asleep. She wonders if she has sleep apnea.  Yesterday while active she became tremulous. At the time she noticed her heart rate was 114 bpm, and her blood pressure was notable for being 36 diastolic. She is not certain if this was an accurate reading.  They deny any chest pain, shortness of breath, or peripheral edema. No lightheadedness, headaches, syncope, orthopnea, or PND.      Past Medical History:  Diagnosis Date   Allergic rhinitis    Anaphylactic reaction to bee sting    Anxiety state    unspecified   Arthritis    Ortho Dr. Berenice Primas   Disorder of bone    unspecified   Essential hypertension 12/28/2019   Hypercholesteremia    migrated   Hyperlipemia    Long term (current) use of anticoagulants 12/28/2019   PAF   Low back pain    Major depression    in complete remission   Osteopenia    Osteoporosis    PAF (paroxysmal atrial fibrillation) ([place]) 12/28/2019   Post-menopausal    Senile purpura ([place])    Sensorineural hearing loss (SNHL)  of both ears 05/08/2021   Dr. Benjamine Mola 2022   Shingles    Sinusitis    UTI (urinary tract infection)    Vitamin D deficiency     Past Surgical History:  Procedure Laterality Date   NO PAST SURGERIES      Current Medications: Current Meds  Medication Sig   Calcium Carb-Cholecalciferol (CALCIUM + D3) 600-200 MG-UNIT TABS Take 1 tablet by mouth daily.   Cholecalciferol (VITAMIN D) 2000 units CAPS Take 2,000 Units by mouth daily.   Cyanocobalamin (B-12 PO) Take by mouth.   diazepam (VALIUM) 5 MG tablet Take 0.5-1 tablets (2.5-5 mg total) by mouth 2 (two) times daily as needed (vertigo).   EPINEPHrine 0.3 mg/0.3 mL IJ SOAJ injection Inject 0.3 mg into the muscle once.   Magnesium Hydroxide (MAGNESIA PO) Take 1 tablet by mouth daily.   metoprolol succinate (TOPROL-XL) 25 MG 24 hr tablet Take 1 tablet (25 mg total) by mouth in the morning and at bedtime.   rivaroxaban (XARELTO) 20 MG TABS tablet TAKE 1 TABLET DAILY WITH SUPPER   rosuvastatin (CRESTOR) 40 MG tablet TAKE 1 TABLET DAILY   Sod Picosulfate-Mag Ox-Cit Acd (CLENPIQ) 10-3.5-12 MG-GM -GM/160ML SOLN Per colonoscopy prep instructions per GI Instructions     Allergies:   Bee venom   Social History   Socioeconomic History   Marital status: Widowed    Spouse name: Not  on file   Number of children: 0   Years of education: Not on file   Highest education level: Not on file  Occupational History   Not on file  Tobacco Use   Smoking status: Former    Types: Cigarettes    Quit date: 02/25/2008    Years since quitting: 13.6   Smokeless tobacco: Never  Vaping Use   Vaping Use: Never used  Substance and Sexual Activity   Alcohol use: Yes    Comment: wine-1 drink nightly w/ dinner   Drug use: Never   Sexual activity: Not Currently    Birth control/protection: Post-menopausal  Other Topics Concern   Not on file  Social History Narrative   Right handed   Caffeine use: rare   Social Determinants of Health   Financial Resource  Strain: Not on file  Food Insecurity: Not on file  Transportation Needs: Not on file  Physical Activity: Not on file  Stress: Not on file  Social Connections: Not on file     Family History: The patient's family history is negative for Colon cancer, Esophageal cancer, Rectal cancer, and Stomach cancer.  ROS:   Please see the history of present illness.    (+) Palpitations (+) Tremors All other systems reviewed and are negative.  EKGs/Labs/Other Studies Reviewed:    The following studies were reviewed today:  09/04/2021 Zio monitor HR 41 - 176 bpm, average 69 bpm. 1 nonsustained VT lasting 14 beats with an average rate 105 bpm. 9% AF burden with ventricular rates 61 - 176 bpm. Occasional supraventricular 4.8% Rare ventricular ectopy.  Symptom triggered episodes correspond to AF.   EKG:  EKG is personally reviewed.  09/30/2021:  EKG was not ordered.   Recent Labs: 05/09/2021: ALT 15; BUN 16; Creatinine, Ser 0.89; Hemoglobin 13.4; Magnesium 2.0; Platelets 299.0; Potassium 4.6; Sodium 138; TSH 1.18   Recent Lipid Panel    Component Value Date/Time   CHOL 158 05/09/2021 0921   CHOL 143 03/28/2019 1407   TRIG 83.0 05/09/2021 0921   HDL 62.40 05/09/2021 0921   HDL 63 03/28/2019 1407   CHOLHDL 3 05/09/2021 0921   VLDL 16.6 05/09/2021 0921   LDLCALC 79 05/09/2021 0921   LDLCALC 66 03/28/2019 1407    Physical Exam:    VS:  BP 132/74   Pulse 65   Ht '5\' 6"'$  (1.676 m)   Wt 127 lb 12.8 oz (58 kg)   SpO2 97%   BMI 20.63 kg/m     Wt Readings from Last 3 Encounters:  09/30/21 127 lb 12.8 oz (58 kg)  08/08/21 124 lb (56.2 kg)  07/25/21 127 lb (57.6 kg)     GEN: Well nourished, well developed in no acute distress HEENT: Normal NECK: No JVD; No carotid bruits LYMPHATICS: No lymphadenopathy CARDIAC: RRR, no murmurs, rubs, gallops RESPIRATORY:  Clear to auscultation without rales, wheezing or rhonchi  ABDOMEN: Soft, non-tender, non-distended MUSCULOSKELETAL:  No edema;  No deformity  SKIN: Warm and dry NEUROLOGIC:  Alert and oriented x 3 PSYCHIATRIC:  Normal affect        ASSESSMENT:    1. PAF (paroxysmal atrial fibrillation) ([place])   2. Essential hypertension   3. Gait abnormality    PLAN:    In order of problems listed above:  #pAF Symptomatic. 9% AF burden on recent monitor with fairly high burden of supraventricular ectopy as well.    ----------   Discussed treatment options today for his AF including antiarrhythmic drug therapy and  ablation. Discussed risks, recovery and likelihood of success. Discussed potential need for repeat ablation procedures and antiarrhythmic drugs after an initial ablation. They wish to proceed with scheduling.   Risk, benefits, and alternatives to EP study and radiofrequency ablation for afib were also discussed in detail today. These risks include but are not limited to stroke, bleeding, vascular damage, tamponade, perforation, damage to the esophagus, lungs, and other structures, pulmonary vein stenosis, worsening renal function, and death. The patient understands these risk and wishes to proceed.  We will therefore proceed with catheter ablation at the next available time.  Carto, ICE, anesthesia are requested for the procedure.  Will also obtain CT PV protocol prior to the procedure to exclude LAA thrombus and further evaluate atrial anatomy.   --------------   I have seen Elizebeth Brooking in the office today who is being considered for a Watchman left atrial appendage closure device. I believe they will benefit from this procedure given their history of atrial fibrillation, CHA2DS2-VASc score of 7. Unfortunately, the patient is not felt to be a long term anticoagulation candidate secondary to gait instability. The patient's chart has been reviewed and I feel that they would be a candidate for short term oral anticoagulation after Watchman implant.    It is my belief that after undergoing a LAA closure procedure, Carly Myers will not need long term anticoagulation which eliminates anticoagulation side effects and major bleeding risk.    Procedural risks for the Watchman implant have been reviewed with the patient including a 0.5% risk of stroke, <1% risk of perforation and <1% risk of device embolization. Other risks include bleeding, vascular damage, tamponade, worsening renal function, and death. The patient understands these risks.     The published clinical data on the safety and effectiveness of WATCHMAN include but are not limited to the following: - Holmes DR, Mechele Claude, Sick P et al. for the PROTECT AF Investigators. Percutaneous closure of the left atrial appendage versus warfarin therapy for prevention of stroke in patients with atrial fibrillation: a randomised non-inferiority trial. Lancet 2009; 374: 534-42. Mechele Claude, Doshi SK, Abelardo Diesel D et al. on behalf of the PROTECT AF Investigators. Percutaneous Left Atrial Appendage Closure for Stroke Prophylaxis in Patients With Atrial Fibrillation 2.3-Year Follow-up of the PROTECT AF (Watchman Left Atrial Appendage System for Embolic Protection in Patients With Atrial Fibrillation) Trial. Circulation 2013; 127:720-729. - Alli O, Doshi S,  Kar S, Reddy VY, Sievert H et al. Quality of Life Assessment in the Randomized PROTECT AF (Percutaneous Closure of the Left Atrial Appendage Versus Warfarin Therapy for Prevention of Stroke in Patients With Atrial Fibrillation) Trial of Patients at Risk for Stroke With Nonvalvular Atrial Fibrillation. J Am Coll Cardiol 2013; 93:2671-2. Vertell Limber DR, Tarri Abernethy, Price M, [place], Sievert H, Doshi S, Huber K, Reddy V. Prospective randomized evaluation of the Watchman left atrial appendage Device in patients with atrial fibrillation versus long-term warfarin therapy; the PREVAIL trial. Journal of the [employer] of Cardiology, Vol. 4, No. 1, 2014, 1-11. - Kar S, Doshi SK, Sadhu A, Horton R, Osorio J et al. Primary outcome  evaluation of a next-generation left atrial appendage closure device: results from the PINNACLE FLX trial. Circulation 2021;143(18)1754-1762.    HAS-BLED score 2 Hypertension - n Abnormal renal and liver function (Dialysis, transplant, Cr >2.26 mg/dL /Cirrhosis or Bilirubin >2x Normal or AST/ALT/AP >3x Normal) No  Stroke Yes  Bleeding No  Labile INR (Unstable/high INR) No  Elderly (>65) y  Drugs or alcohol (? 8 drinks/week, anti-plt or NSAID) n   CHA2DS2-VASc Score = 7  The patient's score is based upon: CHF History: 0 HTN History: 1 Diabetes History: 0 Stroke History: 2 Vascular Disease History: 1 Age Score: 2 Gender Score: 1   Neighbor  Medication Adjustments/Labs and Tests Ordered: Current medicines are reviewed at length with the patient today.  Concerns regarding medicines are outlined above.   No orders of the defined types were placed in this encounter.  No orders of the defined types were placed in this encounter.  I,Mathew Stumpf,acting as a Education administrator for Vickie Epley, MD.,have documented all relevant documentation on the behalf of Vickie Epley, MD,as directed by  Vickie Epley, MD while in the presence of Vickie Epley, MD.  I, Vickie Epley, MD, have reviewed all documentation for this visit. The documentation on 09/30/21 for the exam, diagnosis, procedures, and orders are all accurate and complete.   Signed, Lars Mage, MD, [facility], [facility] 09/30/2021 2:01 PM    Electrophysiology [facility]

## 2021-10-03 DIAGNOSIS — M1712 Unilateral primary osteoarthritis, left knee: Secondary | ICD-10-CM | POA: Diagnosis not present

## 2021-10-16 ENCOUNTER — Ambulatory Visit (HOSPITAL_COMMUNITY): Payer: Medicare Other | Attending: Cardiovascular Disease

## 2021-10-16 DIAGNOSIS — I10 Essential (primary) hypertension: Secondary | ICD-10-CM | POA: Diagnosis not present

## 2021-10-16 DIAGNOSIS — Z01818 Encounter for other preprocedural examination: Secondary | ICD-10-CM | POA: Insufficient documentation

## 2021-10-16 DIAGNOSIS — R269 Unspecified abnormalities of gait and mobility: Secondary | ICD-10-CM | POA: Insufficient documentation

## 2021-10-16 DIAGNOSIS — I480 Paroxysmal atrial fibrillation: Secondary | ICD-10-CM | POA: Insufficient documentation

## 2021-10-16 LAB — ECHOCARDIOGRAM COMPLETE
Area-P 1/2: 2.62 cm2
S' Lateral: 2.4 cm

## 2021-10-21 DIAGNOSIS — Z961 Presence of intraocular lens: Secondary | ICD-10-CM | POA: Diagnosis not present

## 2021-10-21 DIAGNOSIS — H26491 Other secondary cataract, right eye: Secondary | ICD-10-CM | POA: Diagnosis not present

## 2021-10-21 DIAGNOSIS — H524 Presbyopia: Secondary | ICD-10-CM | POA: Diagnosis not present

## 2021-10-21 DIAGNOSIS — H5203 Hypermetropia, bilateral: Secondary | ICD-10-CM | POA: Diagnosis not present

## 2021-10-31 ENCOUNTER — Encounter: Payer: Self-pay | Admitting: Internal Medicine

## 2021-10-31 NOTE — Telephone Encounter (Signed)
The patient has been notified of the results on heart monitor and echo. She verbalized understanding.  All questions (if any) were answered. Darrell Jewel, RN 10/31/2021 5:21 PM

## 2021-11-06 ENCOUNTER — Ambulatory Visit: Payer: Medicare Other | Admitting: Cardiology

## 2021-11-11 ENCOUNTER — Ambulatory Visit: Payer: Medicare Other | Admitting: Family Medicine

## 2021-11-14 DIAGNOSIS — G5762 Lesion of plantar nerve, left lower limb: Secondary | ICD-10-CM | POA: Diagnosis not present

## 2021-11-14 DIAGNOSIS — M79672 Pain in left foot: Secondary | ICD-10-CM | POA: Diagnosis not present

## 2021-11-18 ENCOUNTER — Ambulatory Visit (INDEPENDENT_AMBULATORY_CARE_PROVIDER_SITE_OTHER): Payer: Medicare Other | Admitting: Podiatry

## 2021-11-18 ENCOUNTER — Encounter: Payer: Self-pay | Admitting: Podiatry

## 2021-11-18 ENCOUNTER — Encounter: Payer: Self-pay | Admitting: *Deleted

## 2021-11-18 DIAGNOSIS — M779 Enthesopathy, unspecified: Secondary | ICD-10-CM | POA: Diagnosis not present

## 2021-11-18 DIAGNOSIS — M7752 Other enthesopathy of left foot: Secondary | ICD-10-CM | POA: Diagnosis not present

## 2021-11-18 MED ORDER — TRIAMCINOLONE ACETONIDE 10 MG/ML IJ SUSP
10.0000 mg | Freq: Once | INTRAMUSCULAR | Status: AC
  Administered 2021-11-18: 10 mg

## 2021-11-18 NOTE — Progress Notes (Signed)
Subjective:   Patient ID: Elizebeth Brooking, female   DOB: 76 y.o.   MRN: [id number]   HPI Patient presents stating that she has left foot pain and she went to her dog shows on concrete for 3 straight days approximately 10 days ago.  She has had several cortisone injections since which were not effective and she had x-rays that I am not able to look at as she did not bring them and they are not on her computer.  Patient does not smoke is active and on blood thinner   Review of Systems  All other systems reviewed and are negative.       Objective:  Physical Exam Vitals and nursing note reviewed.  Constitutional:      Appearance: She is well-developed.  Pulmonary:     Effort: Pulmonary effort is normal.  Musculoskeletal:        General: Normal range of motion.  Skin:    General: Skin is warm.  Neurological:     Mental Status: She is alert.     Neurovascular status intact muscle strength found to be adequate range of motion adequate patient found to have discomfort in the left sinus tarsi and other areas that it was very difficult for me to figure out where the discomfort is coming from as I was not able to palpate it in any kind of a pattern that was beneficial.  Patient has good digital perfusion well oriented x3     Assessment:  Difficult to say what might be causing this but it appears to be just fatigue and inflammatory condition with hopeful sinus tarsi     Plan:  H&P reviewed and would like x-ray but she refuses 1 currently and I did go ahead and I did a sinus tarsi injection left and I then discussed immobilization I do think air fracture walker offers the best chance.  I put it on her but she does not want it as she is concerned about balance so currently we will hope the sinus tarsi injection will improve her and she will be seen back as needed

## 2021-11-21 ENCOUNTER — Encounter: Payer: Self-pay | Admitting: Family Medicine

## 2021-11-21 ENCOUNTER — Ambulatory Visit (INDEPENDENT_AMBULATORY_CARE_PROVIDER_SITE_OTHER): Payer: Medicare Other | Admitting: Family Medicine

## 2021-11-21 ENCOUNTER — Ambulatory Visit: Payer: Medicare Other | Admitting: Podiatry

## 2021-11-21 VITALS — BP 110/72 | HR 58 | Temp 98.3°F | Ht 66.0 in | Wt 124.2 lb

## 2021-11-21 DIAGNOSIS — R195 Other fecal abnormalities: Secondary | ICD-10-CM | POA: Diagnosis not present

## 2021-11-21 DIAGNOSIS — I10 Essential (primary) hypertension: Secondary | ICD-10-CM

## 2021-11-21 DIAGNOSIS — Z7901 Long term (current) use of anticoagulants: Secondary | ICD-10-CM

## 2021-11-21 DIAGNOSIS — H81399 Other peripheral vertigo, unspecified ear: Secondary | ICD-10-CM | POA: Diagnosis not present

## 2021-11-21 DIAGNOSIS — F5101 Primary insomnia: Secondary | ICD-10-CM | POA: Diagnosis not present

## 2021-11-21 DIAGNOSIS — I679 Cerebrovascular disease, unspecified: Secondary | ICD-10-CM | POA: Diagnosis not present

## 2021-11-21 DIAGNOSIS — I480 Paroxysmal atrial fibrillation: Secondary | ICD-10-CM

## 2021-11-21 DIAGNOSIS — E782 Mixed hyperlipidemia: Secondary | ICD-10-CM

## 2021-11-21 MED ORDER — LORAZEPAM 0.5 MG PO TABS
0.5000 mg | ORAL_TABLET | Freq: Every evening | ORAL | 2 refills | Status: DC | PRN
Start: 2021-11-21 — End: 2021-11-25

## 2021-11-21 NOTE — Patient Instructions (Signed)
Please return in 6 months for your annual complete physical; please come fasting.   If you have any questions or concerns, please don't hesitate to send me a message via MyChart or call the office at [phone]. Thank you for visiting with us today! It's our pleasure caring for you.  

## 2021-11-21 NOTE — Progress Notes (Signed)
Subjective  CC:  Chief Complaint  Patient presents with   Hypertension    ^mos F/U for Bp    HPI: Carly Myers is a 76 y.o. female who presents to the office today to address the problems listed above in the chief complaint. Hypertension f/u: Control is good . Pt reports she is doing well. taking medications as instructed, no medication side effects noted, no TIAs, no chest pain on exertion, no dyspnea on exertion, no swelling of ankles. Home readings daily are normal and recent GI and Cards visits with normal readings. Gets more anxious in our office. On metoprolol xr 25 daily. She denies adverse effects from his BP medications. Compliance with medication is good.  PAF: for ablation. High fall risk due to vertigo.  If successful, can consider coming off of anticoagulation.  No recent falls.  She understands risks. Hyperlipidemia stable on statin. Chronic vertigo: Persists but she is coming more constant.  Rarely uses Valium for bad days. Chronic intermittent insomnia: Awakens in the middle of the night.  Has used lorazepam intermittently for years to improve this.  Requesting prescription. Seeing podiatry for left foot pain: Question osteoarthritis.  Has had steroid injections.  May need orthotics.  Assessment  1. Essential hypertension   2. PAF (paroxysmal atrial fibrillation) ([place])   3. Long term (current) use of anticoagulants   4. Mixed hyperlipidemia   5. Cerebral vascular disease   6. Positive colorectal cancer screening using Cologuard test with normal colonoscopy 6/23   7. Vestibular vertigo, unspecified laterality   8. Primary insomnia      Plan   Hypertension f/u: BP control is well controlled.  However with whitecoat component in our office.  She will continue home monitoring.  Continue metoprolol X L25 daily PAF on anticoagulation: Discussed preventing falls.  For ablation with cardiology Continue statin Normal colonoscopy reviewed.  No further cancer screening  indicated.  Had false positive Cologuard Vestibular vertigo: Persists, rare Valium use. Insomnia: Discussed use of lorazepam.  Increases fall risk.  She understands risk versus benefits.  Recommend rare use.  Do not use with Valium.  Education regarding management of these chronic disease states was given. Management strategies discussed on successive visits include dietary and exercise recommendations, goals of achieving and maintaining IBW, and lifestyle modifications aiming for adequate sleep and minimizing stressors.   Follow up: 6 months for complete physical  No orders of the defined types were placed in this encounter.  Meds ordered this encounter  Medications   LORazepam (ATIVAN) 0.5 MG tablet    Sig: Take 1 tablet (0.5 mg total) by mouth at bedtime as needed for sleep.    Dispense:  30 tablet    Refill:  2      BP Readings from Last 3 Encounters:  11/21/21 110/72  09/30/21 132/74  08/08/21 128/70   Wt Readings from Last 3 Encounters:  11/21/21 124 lb 3.2 oz (56.3 kg)  09/30/21 127 lb 12.8 oz (58 kg)  08/08/21 124 lb (56.2 kg)    Lab Results  Component Value Date   CHOL 158 05/09/2021   CHOL 140 05/16/2020   CHOL 143 03/28/2019   Lab Results  Component Value Date   HDL 62.40 05/09/2021   HDL 58.90 05/16/2020   HDL 63 03/28/2019   Lab Results  Component Value Date   LDLCALC 79 05/09/2021   LDLCALC 70 05/16/2020   LDLCALC 66 03/28/2019   Lab Results  Component Value Date   TRIG 83.0 05/09/2021  TRIG 57.0 05/16/2020   TRIG 69 03/28/2019   Lab Results  Component Value Date   CHOLHDL 3 05/09/2021   CHOLHDL 2 05/16/2020   CHOLHDL 2.3 03/28/2019   No results found for: "LDLDIRECT" Lab Results  Component Value Date   CREATININE 0.89 05/09/2021   BUN 16 05/09/2021   NA 138 05/09/2021   K 4.6 05/09/2021   CL 102 05/09/2021   CO2 28 05/09/2021    The 10-year ASCVD risk score (Arnett DK, et al., 2019) is: 22.2%   Values used to calculate the  score:     Age: 51 years     Sex: Female     Is Non-Hispanic African American: No     Diabetic: No     Tobacco smoker: No     Systolic Blood Pressure: 537 mmHg     Is BP treated: Yes     HDL Cholesterol: 62.4 mg/dL     Total Cholesterol: 158 mg/dL  I reviewed the patients updated PMH, FH, and SocHx.    Patient Active Problem List   Diagnosis Date Noted   Vestibular vertigo 10/26/2020    Priority: High   Cerebral vascular disease 10/26/2020    Priority: High   Essential hypertension 12/28/2019    Priority: High   PAF (paroxysmal atrial fibrillation) ([place]) 12/28/2019    Priority: High   Long term (current) use of anticoagulants 12/28/2019    Priority: High   Mixed hyperlipidemia 03/25/2017    Priority: High   Osteopenia 03/25/2017    Priority: Medium    Positive colorectal cancer screening using Cologuard test with normal colonoscopy 6/23 11/21/2021    Priority: Low   Sensorineural hearing loss (SNHL) of both ears 05/08/2021    Priority: Low   Vitamin deficiency 03/25/2017    Priority: Low   Senile purpura ([place]) 03/25/2017    Priority: Low   Allergic rhinitis 03/25/2017    Priority: Low    Allergies: Bee venom  Social History: Patient  reports that she quit smoking about 13 years ago. Her smoking use included cigarettes. She has never used smokeless tobacco. She reports current alcohol use. She reports that she does not use drugs.  Current Meds  Medication Sig   Calcium Carb-Cholecalciferol (CALCIUM + D3) 600-200 MG-UNIT TABS Take 1 tablet by mouth daily.   Cholecalciferol (VITAMIN D) 2000 units CAPS Take 2,000 Units by mouth daily.   Cyanocobalamin (B-12 PO) Take by mouth.   diazepam (VALIUM) 5 MG tablet Take 0.5-1 tablets (2.5-5 mg total) by mouth 2 (two) times daily as needed (vertigo).   EPINEPHrine 0.3 mg/0.3 mL IJ SOAJ injection Inject 0.3 mg into the muscle once.   Magnesium Hydroxide (MAGNESIA PO) Take 1 tablet by mouth daily.   metoprolol succinate  (TOPROL-XL) 25 MG 24 hr tablet Take 1 tablet (25 mg total) by mouth in the morning and at bedtime.   rivaroxaban (XARELTO) 20 MG TABS tablet TAKE 1 TABLET DAILY WITH SUPPER   rosuvastatin (CRESTOR) 40 MG tablet TAKE 1 TABLET DAILY   Sod Picosulfate-Mag Ox-Cit Acd (CLENPIQ) 10-3.5-12 MG-GM -GM/160ML SOLN Per colonoscopy prep instructions per GI Instructions    Review of Systems: Cardiovascular: negative for chest pain, palpitations, leg swelling, orthopnea Respiratory: negative for SOB, wheezing or persistent cough Gastrointestinal: negative for abdominal pain Genitourinary: negative for dysuria or gross hematuria  Objective  Vitals: BP 110/72 Comment: by home reading this am  Pulse (!) 58   Temp 98.3 F (36.8 C)   Ht '5\' 6"'$  (  1.676 m)   Wt 124 lb 3.2 oz (56.3 kg)   SpO2 98%   BMI 20.05 kg/m  General: no acute distress, happy today Psych:  Alert and oriented, normal mood and affect HEENT:  Normocephalic, atraumatic, supple neck  Cardiovascular:  RRR without murmur. no edema Respiratory:  Good breath sounds bilaterally, CTAB with normal respiratory effort Skin:  Warm, no rashes Neurologic:   Mental status is normal Commons side effects, risks, benefits, and alternatives for medications and treatment plan prescribed today were discussed, and the patient expressed understanding of the given instructions. Patient is instructed to call or message via MyChart if he/she has any questions or concerns regarding our treatment plan. No barriers to understanding were identified. We discussed Red Flag symptoms and signs in detail. Patient expressed understanding regarding what to do in case of urgent or emergency type symptoms.  Medication list was reconciled, printed and provided to the patient in AVS. Patient instructions and summary information was reviewed with the patient as documented in the AVS. This note was prepared with assistance of Dragon voice recognition software. Occasional wrong-word  or sound-a-like substitutions may have occurred due to the inherent limitation

## 2021-11-22 ENCOUNTER — Encounter: Payer: Self-pay | Admitting: Family Medicine

## 2021-11-25 ENCOUNTER — Other Ambulatory Visit: Payer: Self-pay

## 2021-11-25 MED ORDER — LORAZEPAM 0.5 MG PO TABS
0.5000 mg | ORAL_TABLET | Freq: Every evening | ORAL | 2 refills | Status: DC | PRN
Start: 2021-11-25 — End: 2023-06-22

## 2021-11-25 NOTE — Telephone Encounter (Signed)
Ativan back ordered at [facility]. I have switched pharmacy to [facility] per patient request

## 2021-12-02 DIAGNOSIS — M25562 Pain in left knee: Secondary | ICD-10-CM | POA: Diagnosis not present

## 2021-12-02 DIAGNOSIS — G5762 Lesion of plantar nerve, left lower limb: Secondary | ICD-10-CM | POA: Diagnosis not present

## 2021-12-04 ENCOUNTER — Other Ambulatory Visit: Payer: Self-pay | Admitting: Internal Medicine

## 2021-12-11 ENCOUNTER — Ambulatory Visit: Payer: Medicare Other | Admitting: Podiatry

## 2021-12-16 DIAGNOSIS — L570 Actinic keratosis: Secondary | ICD-10-CM | POA: Diagnosis not present

## 2021-12-16 DIAGNOSIS — L821 Other seborrheic keratosis: Secondary | ICD-10-CM | POA: Diagnosis not present

## 2021-12-16 DIAGNOSIS — Z85828 Personal history of other malignant neoplasm of skin: Secondary | ICD-10-CM | POA: Diagnosis not present

## 2021-12-31 ENCOUNTER — Other Ambulatory Visit: Payer: Self-pay | Admitting: Internal Medicine

## 2021-12-31 DIAGNOSIS — I480 Paroxysmal atrial fibrillation: Secondary | ICD-10-CM

## 2021-12-31 NOTE — Telephone Encounter (Signed)
Prescription refill request for Xarelto received.  Indication:afib Last office visit:8/23 Weight:56.3 kg Age:76 Scr:0.8 CrCl:51.51  ml/min  Prescription refilled

## 2022-01-02 ENCOUNTER — Ambulatory Visit: Payer: Medicare Other | Attending: Cardiology

## 2022-01-02 ENCOUNTER — Encounter: Payer: Self-pay | Admitting: Cardiology

## 2022-01-02 DIAGNOSIS — Z01818 Encounter for other preprocedural examination: Secondary | ICD-10-CM

## 2022-01-02 DIAGNOSIS — I10 Essential (primary) hypertension: Secondary | ICD-10-CM | POA: Diagnosis not present

## 2022-01-02 DIAGNOSIS — I480 Paroxysmal atrial fibrillation: Secondary | ICD-10-CM

## 2022-01-02 DIAGNOSIS — R269 Unspecified abnormalities of gait and mobility: Secondary | ICD-10-CM | POA: Diagnosis not present

## 2022-01-03 LAB — CBC WITH DIFFERENTIAL/PLATELET
Basophils Absolute: 0.1 10*3/uL (ref 0.0–0.2)
Basos: 1 %
EOS (ABSOLUTE): 0.1 10*3/uL (ref 0.0–0.4)
Eos: 1 %
Hematocrit: 38.7 % (ref 34.0–46.6)
Hemoglobin: 12.6 g/dL (ref 11.1–15.9)
Immature Grans (Abs): 0 10*3/uL (ref 0.0–0.1)
Immature Granulocytes: 0 %
Lymphocytes Absolute: 3.1 10*3/uL (ref 0.7–3.1)
Lymphs: 36 %
MCH: 31.8 pg (ref 26.6–33.0)
MCHC: 32.6 g/dL (ref 31.5–35.7)
MCV: 98 fL — ABNORMAL HIGH (ref 79–97)
Monocytes Absolute: 0.9 10*3/uL (ref 0.1–0.9)
Monocytes: 10 %
Neutrophils Absolute: 4.4 10*3/uL (ref 1.4–7.0)
Neutrophils: 52 %
Platelets: 311 10*3/uL (ref 150–450)
RBC: 3.96 x10E6/uL (ref 3.77–5.28)
RDW: 12.7 % (ref 11.7–15.4)
WBC: 8.5 10*3/uL (ref 3.4–10.8)

## 2022-01-03 LAB — BASIC METABOLIC PANEL
BUN/Creatinine Ratio: 21 (ref 12–28)
BUN: 17 mg/dL (ref 8–27)
CO2: 26 mmol/L (ref 20–29)
Calcium: 9.3 mg/dL (ref 8.7–10.3)
Chloride: 104 mmol/L (ref 96–106)
Creatinine, Ser: 0.82 mg/dL (ref 0.57–1.00)
Glucose: 97 mg/dL (ref 70–99)
Potassium: 5 mmol/L (ref 3.5–5.2)
Sodium: 140 mmol/L (ref 134–144)
eGFR: 73 mL/min/{1.73_m2} (ref >59)

## 2022-01-09 ENCOUNTER — Encounter: Payer: Self-pay | Admitting: Cardiology

## 2022-01-13 ENCOUNTER — Other Ambulatory Visit: Payer: Self-pay | Admitting: Internal Medicine

## 2022-01-15 ENCOUNTER — Telehealth (HOSPITAL_COMMUNITY): Payer: Self-pay | Admitting: Emergency Medicine

## 2022-01-15 NOTE — Telephone Encounter (Signed)
Attempted to call patient regarding upcoming cardiac CT appointment. °Left message on voicemail with name and callback number °Phala Halea Lieb RN Navigator Cardiac Imaging °[facility] °[phone] Office °[phone] Cell ° °

## 2022-01-17 ENCOUNTER — Telehealth (HOSPITAL_COMMUNITY): Payer: Self-pay | Admitting: *Deleted

## 2022-01-17 NOTE — Telephone Encounter (Signed)
Attempted to call patient regarding upcoming cardiac CT appointment. °Left message on voicemail with name and callback number ° °Zaydon Kinser RN Navigator Cardiac Imaging °[facility] °[phone] Office °[phone] Cell ° °

## 2022-01-20 ENCOUNTER — Telehealth (HOSPITAL_COMMUNITY): Payer: Self-pay | Admitting: Emergency Medicine

## 2022-01-20 ENCOUNTER — Ambulatory Visit (HOSPITAL_BASED_OUTPATIENT_CLINIC_OR_DEPARTMENT_OTHER): Payer: Medicare Other

## 2022-01-20 NOTE — Telephone Encounter (Signed)
Reaching out to patient to offer assistance regarding upcoming cardiac imaging study; pt verbalizes understanding of appt date/time, parking situation and where to check in, pre-test NPO status and medications ordered, and verified current allergies; name and call back number provided for further questions should they arise Carly Bond RN Navigator Cardiac Imaging [name] [phone] office [phone] cell  Arrive 1000  Daily meds Denies iv issues

## 2022-01-21 ENCOUNTER — Ambulatory Visit (HOSPITAL_COMMUNITY)
Admission: RE | Admit: 2022-01-21 | Discharge: 2022-01-21 | Disposition: A | Discharge status: Home / Self Care | Payer: Medicare Other | Source: Ambulatory Visit | Attending: Cardiology | Admitting: Cardiology

## 2022-01-21 DIAGNOSIS — Z01818 Encounter for other preprocedural examination: Secondary | ICD-10-CM | POA: Diagnosis not present

## 2022-01-21 DIAGNOSIS — I10 Essential (primary) hypertension: Secondary | ICD-10-CM | POA: Insufficient documentation

## 2022-01-21 DIAGNOSIS — R269 Unspecified abnormalities of gait and mobility: Secondary | ICD-10-CM | POA: Insufficient documentation

## 2022-01-21 DIAGNOSIS — I480 Paroxysmal atrial fibrillation: Secondary | ICD-10-CM | POA: Diagnosis not present

## 2022-01-21 MED ORDER — IOHEXOL 350 MG/ML SOLN
100.0000 mL | Freq: Once | INTRAVENOUS | Status: AC | PRN
  Administered 2022-01-21: 100 mL via INTRAVENOUS

## 2022-01-22 DIAGNOSIS — H838X2 Other specified diseases of left inner ear: Secondary | ICD-10-CM | POA: Diagnosis not present

## 2022-01-22 DIAGNOSIS — H903 Sensorineural hearing loss, bilateral: Secondary | ICD-10-CM | POA: Diagnosis not present

## 2022-01-22 DIAGNOSIS — R42 Dizziness and giddiness: Secondary | ICD-10-CM | POA: Diagnosis not present

## 2022-01-22 DIAGNOSIS — H838X3 Other specified diseases of inner ear, bilateral: Secondary | ICD-10-CM | POA: Diagnosis not present

## 2022-01-22 NOTE — Pre-Procedure Instructions (Signed)
Instructed patient on the following items: Arrival time 0530 Nothing to eat or drink after midnight No meds AM of procedure Responsible person to drive you home and stay with you for 24 hrs  Have you missed any doses of anti-coagulant Xarelto- hasn't missed any doses    

## 2022-01-22 NOTE — Anesthesia Preprocedure Evaluation (Addendum)
Anesthesia Evaluation  Patient identified by MRN, date of birth, ID band Patient awake    Reviewed: Allergy & Precautions, NPO status , Patient's Chart, lab work & pertinent test results, reviewed documented beta blocker date and time   Airway Mallampati: II  TM Distance: >3 FB Neck ROM: Full    Dental  (+) Dental Advisory Given, Lower Dentures, Upper Dentures   Pulmonary former smoker   Pulmonary exam normal breath sounds clear to auscultation       Cardiovascular hypertension, Pt. on home beta blockers + Peripheral Vascular Disease  + dysrhythmias Atrial Fibrillation  Rhythm:Regular Rate:Normal     Neuro/Psych  PSYCHIATRIC DISORDERS Anxiety Depression    SNHL negative neurological ROS     GI/Hepatic negative GI ROS, Neg liver ROS,,,  Endo/Other  negative endocrine ROS    Renal/GU negative Renal ROS     Musculoskeletal  (+) Arthritis ,    Abdominal   Peds  Hematology  (+) Blood dyscrasia (Xarelto)   Anesthesia Other Findings Day of surgery medications reviewed with the patient.  Reproductive/Obstetrics                             Anesthesia Physical Anesthesia Plan  ASA: 3  Anesthesia Plan: General   Post-op Pain Management: Tylenol PO (pre-op)*   Induction: Intravenous  PONV Risk Score and Plan: 3 and Dexamethasone, Ondansetron and Treatment may vary due to age or medical condition  Airway Management Planned: Oral ETT  Additional Equipment:   Intra-op Plan:   Post-operative Plan: Extubation in OR  Informed Consent: I have reviewed the patients History and Physical, chart, labs and discussed the procedure including the risks, benefits and alternatives for the proposed anesthesia with the patient or authorized representative who has indicated his/her understanding and acceptance.     Dental advisory given  Plan Discussed with: CRNA  Anesthesia Plan Comments:         Anesthesia Quick Evaluation

## 2022-01-23 ENCOUNTER — Other Ambulatory Visit (HOSPITAL_COMMUNITY): Payer: Self-pay

## 2022-01-23 ENCOUNTER — Ambulatory Visit (HOSPITAL_COMMUNITY)
Admission: RE | Admit: 2022-01-23 | Discharge: 2022-01-23 | Disposition: A | Discharge status: Home / Self Care | Payer: Medicare Other | Attending: Cardiology | Admitting: Cardiology

## 2022-01-23 ENCOUNTER — Ambulatory Visit (HOSPITAL_COMMUNITY): Payer: Medicare Other | Admitting: Anesthesiology

## 2022-01-23 ENCOUNTER — Other Ambulatory Visit: Payer: Self-pay

## 2022-01-23 ENCOUNTER — Encounter (HOSPITAL_COMMUNITY): Payer: Self-pay | Admitting: Cardiology

## 2022-01-23 ENCOUNTER — Encounter (HOSPITAL_COMMUNITY)
Admission: RE | Disposition: A | Discharge status: Home / Self Care | Payer: Self-pay | Source: Home / Self Care | Attending: Cardiology

## 2022-01-23 ENCOUNTER — Ambulatory Visit (HOSPITAL_BASED_OUTPATIENT_CLINIC_OR_DEPARTMENT_OTHER): Payer: Medicare Other | Admitting: Anesthesiology

## 2022-01-23 DIAGNOSIS — Z7901 Long term (current) use of anticoagulants: Secondary | ICD-10-CM | POA: Diagnosis not present

## 2022-01-23 DIAGNOSIS — I739 Peripheral vascular disease, unspecified: Secondary | ICD-10-CM | POA: Diagnosis not present

## 2022-01-23 DIAGNOSIS — R269 Unspecified abnormalities of gait and mobility: Secondary | ICD-10-CM | POA: Diagnosis not present

## 2022-01-23 DIAGNOSIS — I480 Paroxysmal atrial fibrillation: Secondary | ICD-10-CM | POA: Insufficient documentation

## 2022-01-23 DIAGNOSIS — Z87891 Personal history of nicotine dependence: Secondary | ICD-10-CM

## 2022-01-23 DIAGNOSIS — M1990 Unspecified osteoarthritis, unspecified site: Secondary | ICD-10-CM

## 2022-01-23 DIAGNOSIS — F418 Other specified anxiety disorders: Secondary | ICD-10-CM | POA: Diagnosis not present

## 2022-01-23 DIAGNOSIS — I4891 Unspecified atrial fibrillation: Secondary | ICD-10-CM

## 2022-01-23 DIAGNOSIS — I10 Essential (primary) hypertension: Secondary | ICD-10-CM | POA: Diagnosis not present

## 2022-01-23 HISTORY — PX: ATRIAL FIBRILLATION ABLATION: EP1191

## 2022-01-23 SURGERY — ATRIAL FIBRILLATION ABLATION
Anesthesia: General

## 2022-01-23 MED ORDER — ROCURONIUM BROMIDE 100 MG/10ML IV SOLN
INTRAVENOUS | Status: DC | PRN
  Administered 2022-01-23: 60 mg via INTRAVENOUS

## 2022-01-23 MED ORDER — SUGAMMADEX SODIUM 200 MG/2ML IV SOLN
INTRAVENOUS | Status: DC | PRN
  Administered 2022-01-23: 150 mg via INTRAVENOUS

## 2022-01-23 MED ORDER — SODIUM CHLORIDE 0.9 % IV SOLN: INTRAVENOUS | Status: DC

## 2022-01-23 MED ORDER — SODIUM CHLORIDE 0.9% FLUSH: 3.0000 mL | INTRAVENOUS | Status: DC | PRN

## 2022-01-23 MED ORDER — PHENYLEPHRINE 80 MCG/ML (10ML) SYRINGE FOR IV PUSH (FOR BLOOD PRESSURE SUPPORT)
PREFILLED_SYRINGE | INTRAVENOUS | Status: DC | PRN
  Administered 2022-01-23: 80 ug via INTRAVENOUS

## 2022-01-23 MED ORDER — PANTOPRAZOLE SODIUM 40 MG PO TBEC
40.0000 mg | DELAYED_RELEASE_TABLET | Freq: Every day | ORAL | 0 refills | Status: DC
  Filled 2022-01-23: qty 45, 45d supply, fill #0

## 2022-01-23 MED ORDER — DEXAMETHASONE SODIUM PHOSPHATE 10 MG/ML IJ SOLN
INTRAMUSCULAR | Status: DC | PRN
  Administered 2022-01-23: 5 mg via INTRAVENOUS

## 2022-01-23 MED ORDER — ONDANSETRON HCL 4 MG/2ML IJ SOLN
INTRAMUSCULAR | Status: DC | PRN
  Administered 2022-01-23: 4 mg via INTRAVENOUS

## 2022-01-23 MED ORDER — PROTAMINE SULFATE 10 MG/ML IV SOLN
INTRAVENOUS | Status: DC | PRN
  Administered 2022-01-23: 30 mg via INTRAVENOUS

## 2022-01-23 MED ORDER — HEPARIN SODIUM (PORCINE) 1000 UNIT/ML IJ SOLN
INTRAMUSCULAR | Status: AC
  Filled 2022-01-23: qty 10

## 2022-01-23 MED ORDER — LIDOCAINE 2% (20 MG/ML) 5 ML SYRINGE
INTRAMUSCULAR | Status: DC | PRN
  Administered 2022-01-23: 60 mg via INTRAVENOUS

## 2022-01-23 MED ORDER — SODIUM CHLORIDE 0.9 % IV SOLN: 250.0000 mL | INTRAVENOUS | Status: DC | PRN

## 2022-01-23 MED ORDER — PANTOPRAZOLE SODIUM 40 MG PO TBEC
40.0000 mg | DELAYED_RELEASE_TABLET | Freq: Every day | ORAL | Status: DC
  Administered 2022-01-23: 40 mg via ORAL
  Filled 2022-01-23: qty 1

## 2022-01-23 MED ORDER — PROPOFOL 10 MG/ML IV BOLUS
INTRAVENOUS | Status: DC | PRN
  Administered 2022-01-23: 100 mg via INTRAVENOUS

## 2022-01-23 MED ORDER — HEPARIN SODIUM (PORCINE) 1000 UNIT/ML IJ SOLN
INTRAMUSCULAR | Status: DC | PRN
  Administered 2022-01-23: 1000 [IU] via INTRAVENOUS

## 2022-01-23 MED ORDER — ONDANSETRON HCL 4 MG/2ML IJ SOLN: 4.0000 mg | Freq: Four times a day (QID) | INTRAMUSCULAR | Status: DC | PRN

## 2022-01-23 MED ORDER — COLCHICINE 0.6 MG PO TABS
0.6000 mg | ORAL_TABLET | Freq: Two times a day (BID) | ORAL | 0 refills | Status: DC
  Filled 2022-01-23: qty 10, 5d supply, fill #0

## 2022-01-23 MED ORDER — HEPARIN (PORCINE) IN NACL 1000-0.9 UT/500ML-% IV SOLN
INTRAVENOUS | Status: DC | PRN
  Administered 2022-01-23 (×2): 500 mL

## 2022-01-23 MED ORDER — ACETAMINOPHEN 500 MG PO TABS
1000.0000 mg | ORAL_TABLET | Freq: Once | ORAL | Status: AC
  Administered 2022-01-23: 1000 mg via ORAL
  Filled 2022-01-23: qty 2

## 2022-01-23 MED ORDER — HEPARIN SODIUM (PORCINE) 1000 UNIT/ML IJ SOLN
INTRAMUSCULAR | Status: DC | PRN
  Administered 2022-01-23: 9000 [IU] via INTRAVENOUS

## 2022-01-23 MED ORDER — COLCHICINE 0.6 MG PO TABS
0.6000 mg | ORAL_TABLET | Freq: Two times a day (BID) | ORAL | Status: DC
  Administered 2022-01-23: 0.6 mg via ORAL
  Filled 2022-01-23: qty 1

## 2022-01-23 MED ORDER — ACETAMINOPHEN 325 MG PO TABS: 650.0000 mg | ORAL_TABLET | ORAL | Status: DC | PRN

## 2022-01-23 MED ORDER — SODIUM CHLORIDE 0.9% FLUSH: 3.0000 mL | Freq: Two times a day (BID) | INTRAVENOUS | Status: DC

## 2022-01-23 MED ORDER — FENTANYL CITRATE (PF) 100 MCG/2ML IJ SOLN
INTRAMUSCULAR | Status: DC | PRN
  Administered 2022-01-23 (×2): 25 ug via INTRAVENOUS
  Administered 2022-01-23: 50 ug via INTRAVENOUS

## 2022-01-23 SURGICAL SUPPLY — 18 items
BAG SNAP BAND KOVER 36X36 (MISCELLANEOUS) IMPLANT
CATH 8FR REPROCESSED SOUNDSTAR (CATHETERS) ×1 IMPLANT
CATH 8FR SOUNDSTAR REPROCESSED (CATHETERS) IMPLANT
CATH ABLAT QDOT MICRO BI TC DF (CATHETERS) IMPLANT
CATH OCTARAY 2.0 F 3-3-3-3-3 (CATHETERS) IMPLANT
CATH WEBSTER BI DIR CS D-F CRV (CATHETERS) IMPLANT
CLOSURE PERCLOSE PROSTYLE (VASCULAR PRODUCTS) IMPLANT
COVER SWIFTLINK CONNECTOR (BAG) ×2 IMPLANT
PACK EP LATEX FREE (CUSTOM PROCEDURE TRAY) ×1
PACK EP LF (CUSTOM PROCEDURE TRAY) ×2 IMPLANT
PAD DEFIB RADIO PHYSIO CONN (PAD) ×2 IMPLANT
PATCH CARTO3 (PAD) IMPLANT
SHEATH BAYLIS TRANSSEPTAL 98CM (NEEDLE) IMPLANT
SHEATH CARTO VIZIGO SM CVD (SHEATH) IMPLANT
SHEATH PINNACLE 8F 10CM (SHEATH) IMPLANT
SHEATH PINNACLE 9F 10CM (SHEATH) IMPLANT
SHEATH PROBE COVER 6X72 (BAG) IMPLANT
TUBING SMART ABLATE COOLFLOW (TUBING) IMPLANT

## 2022-01-23 NOTE — Transfer of Care (Signed)
Immediate Anesthesia Transfer of Care Note  Patient: Carly Myers  Procedure(s) Performed: ATRIAL FIBRILLATION ABLATION  Patient Location: PACU  Anesthesia Type:General  Level of Consciousness: awake and patient cooperative  Airway & Oxygen Therapy: Patient Spontanous Breathing  Post-op Assessment: Report given to RN and Post -op Vital signs reviewed and stable  Post vital signs: Reviewed and stable  Last Vitals:  Vitals Value Taken Time  BP 106/50 01/23/22 0935  Temp 36.4 C 01/23/22 0934  Pulse 65 01/23/22 0936  Resp 17 01/23/22 0936  SpO2 93 % 01/23/22 0936  Vitals shown include unvalidated device data.  Last Pain:  Vitals:   01/23/22 0934  TempSrc: Temporal  PainSc: 0-No pain         Complications: There were no known notable events for this encounter.

## 2022-01-23 NOTE — Anesthesia Procedure Notes (Signed)
Procedure Name: Intubation Date/Time: 01/23/2022 7:47 AM  Performed by: Gwyndolyn Saxon, CRNAPre-anesthesia Checklist: Patient identified, Emergency Drugs available, Suction available and Patient being monitored Patient Re-evaluated:Patient Re-evaluated prior to induction Oxygen Delivery Method: Circle system utilized Preoxygenation: Pre-oxygenation with 100% oxygen Induction Type: IV induction Ventilation: Mask ventilation without difficulty Laryngoscope Size: Miller and 2 Grade View: Grade I Tube type: Oral Tube size: 7.0 mm Number of attempts: 1 Airway Equipment and Method: Stylet Placement Confirmation: ETT inserted through vocal cords under direct vision, positive ETCO2 and breath sounds checked- equal and bilateral Secured at: 20 cm Tube secured with: Tape Dental Injury: Teeth and Oropharynx as per pre-operative assessment

## 2022-01-23 NOTE — Anesthesia Postprocedure Evaluation (Signed)
Anesthesia Post Note  Patient: Carly Myers  Procedure(s) Performed: ATRIAL FIBRILLATION ABLATION     Patient location during evaluation: PACU Anesthesia Type: General Level of consciousness: awake and alert Pain management: pain level controlled Vital Signs Assessment: post-procedure vital signs reviewed and stable Respiratory status: spontaneous breathing, nonlabored ventilation, respiratory function stable and patient connected to nasal cannula oxygen Cardiovascular status: blood pressure returned to baseline and stable Postop Assessment: no apparent nausea or vomiting Anesthetic complications: no   There were no known notable events for this encounter.  Last Vitals:  Vitals:   01/23/22 1400 01/23/22 1413  BP: 122/75   Pulse: 78 82  Resp: 17 16  Temp:    SpO2: 95% 96%    Last Pain:  Vitals:   01/23/22 1030  TempSrc:   PainSc: 3                  Santa Lighter

## 2022-01-23 NOTE — H&P (Addendum)
Electrophysiology Office Follow up Visit Note:     Date:  01/23/2022    ID:  Carly Myers, DOB 01-17-1946, MRN [id number]   PCP:  Leamon Arnt, MD     [facility] Cardiologist:  Dorris Carnes, MD  [facility] Electrophysiologist:  Vickie Epley, MD      Interval History:     Carly Myers is a 75 y.o. female who presents for a follow up visit. They were last seen in clinic 08/08/2021 for pAF. She was initially referred to me to discuss LAAO given gait instability and risks of falling/bleeding. At the last appointment I recommended a Zio monitor to better assess her AF burden prior to finalizing the plan.    Today, we reviewed the results of her Zio monitor. She has been concerned about the finding of ventricular tachycardia, which we discussed. She confirms that during her recorded event while wearing the monitor, she had been feeling rapid heart palpitations; this corresponded with atrial fibrillation. Usually this seems to occur while she is asleep. She wonders if she has sleep apnea.   Yesterday while active she became tremulous. At the time she noticed her heart rate was 114 bpm, and her blood pressure was notable for being 36 diastolic. She is not certain if this was an accurate reading.   They deny any chest pain, shortness of breath, or peripheral edema. No lightheadedness, headaches, syncope, orthopnea, or PND.   Presents for PVI today.    Objective      Past Medical History:  Diagnosis Date   Allergic rhinitis     Anaphylactic reaction to bee sting     Anxiety state      unspecified   Arthritis      Ortho Dr. Berenice Primas   Disorder of bone      unspecified   Essential hypertension 12/28/2019   Hypercholesteremia      migrated   Hyperlipemia     Long term (current) use of anticoagulants 12/28/2019    PAF   Low back pain     Major depression      in complete remission   Osteopenia     Osteoporosis     PAF (paroxysmal atrial fibrillation) ([place]) 12/28/2019    Post-menopausal     Senile purpura ([place])     Sensorineural hearing loss (SNHL) of both ears 05/08/2021    Dr. Benjamine Mola 2022   Shingles     Sinusitis     UTI (urinary tract infection)     Vitamin D deficiency             Past Surgical History:  Procedure Laterality Date   NO PAST SURGERIES          Current Medications: Active Medications      Current Meds  Medication Sig   Calcium Carb-Cholecalciferol (CALCIUM + D3) 600-200 MG-UNIT TABS Take 1 tablet by mouth daily.   Cholecalciferol (VITAMIN D) 2000 units CAPS Take 2,000 Units by mouth daily.   Cyanocobalamin (B-12 PO) Take by mouth.   diazepam (VALIUM) 5 MG tablet Take 0.5-1 tablets (2.5-5 mg total) by mouth 2 (two) times daily as needed (vertigo).   EPINEPHrine 0.3 mg/0.3 mL IJ SOAJ injection Inject 0.3 mg into the muscle once.   Magnesium Hydroxide (MAGNESIA PO) Take 1 tablet by mouth daily.   metoprolol succinate (TOPROL-XL) 25 MG 24 hr tablet Take 1 tablet (25 mg total) by mouth in the morning and at bedtime.   rivaroxaban (XARELTO) 20 MG TABS  tablet TAKE 1 TABLET DAILY WITH SUPPER   rosuvastatin (CRESTOR) 40 MG tablet TAKE 1 TABLET DAILY   Sod Picosulfate-Mag Ox-Cit Acd (CLENPIQ) 10-3.5-12 MG-GM -GM/160ML SOLN Per colonoscopy prep instructions per GI Instructions        Allergies:   Bee venom    Social History         Socioeconomic History   Marital status: Widowed      Spouse name: Not on file   Number of children: 0   Years of education: Not on file   Highest education level: Not on file  Occupational History   Not on file  Tobacco Use   Smoking status: Former      Types: Cigarettes      Quit date: 02/25/2008      Years since quitting: 13.6   Smokeless tobacco: Never  Vaping Use   Vaping Use: Never used  Substance and Sexual Activity   Alcohol use: Yes      Comment: wine-1 drink nightly w/ dinner   Drug use: Never   Sexual activity: Not Currently      Birth control/protection: Post-menopausal  Other  Topics Concern   Not on file  Social History Narrative    Right handed    Caffeine use: rare    Social Determinants of Health    Financial Resource Strain: Not on file  Food Insecurity: Not on file  Transportation Needs: Not on file  Physical Activity: Not on file  Stress: Not on file  Social Connections: Not on file      Family History: The patient's family history is negative for Colon cancer, Esophageal cancer, Rectal cancer, and Stomach cancer.   ROS:   Please see the history of present illness.    (+) Palpitations (+) Tremors All other systems reviewed and are negative.   EKGs/Labs/Other Studies Reviewed:     The following studies were reviewed today:   09/04/2021 Zio monitor HR 41 - 176 bpm, average 69 bpm. 1 nonsustained VT lasting 14 beats with an average rate 105 bpm. 9% AF burden with ventricular rates 61 - 176 bpm. Occasional supraventricular 4.8% Rare ventricular ectopy.  Symptom triggered episodes correspond to AF.     EKG:  EKG is personally reviewed.  09/30/2021:  EKG was not ordered.     Recent Labs: 05/09/2021: ALT 15; BUN 16; Creatinine, Ser 0.89; Hemoglobin 13.4; Magnesium 2.0; Platelets 299.0; Potassium 4.6; Sodium 138; TSH 1.18    Recent Lipid Panel Labs (Brief)          Component Value Date/Time    CHOL 158 05/09/2021 0921    CHOL 143 03/28/2019 1407    TRIG 83.0 05/09/2021 0921    HDL 62.40 05/09/2021 0921    HDL 63 03/28/2019 1407    CHOLHDL 3 05/09/2021 0921    VLDL 16.6 05/09/2021 0921    LDLCALC 79 05/09/2021 0921    LDLCALC 66 03/28/2019 1407        Physical Exam:     VS:  BP 153/71   Pulse 67   Ht '5\' 6"'$  (1.676 m)   Wt 127 lb 12.8 oz (58 kg)   SpO2 97%   BMI 20.63 kg/m         Wt Readings from Last 3 Encounters:  09/30/21 127 lb 12.8 oz (58 kg)  08/08/21 124 lb (56.2 kg)  07/25/21 127 lb (57.6 kg)      GEN: Well nourished, well developed in no acute distress HEENT: Normal NECK: No  JVD; No carotid  bruits LYMPHATICS: No lymphadenopathy CARDIAC: RRR, no murmurs, rubs, gallops RESPIRATORY:  Clear to auscultation without rales, wheezing or rhonchi  ABDOMEN: Soft, non-tender, non-distended MUSCULOSKELETAL:  No edema; No deformity  SKIN: Warm and dry NEUROLOGIC:  Alert and oriented x 3 PSYCHIATRIC:  Normal affect            Assessment ASSESSMENT:     1. PAF (paroxysmal atrial fibrillation) ([place])   2. Essential hypertension   3. Gait abnormality     PLAN:     In order of problems listed above:   #pAF Symptomatic. 9% AF burden on recent monitor with fairly high burden of supraventricular ectopy as well.    ----------   Discussed treatment options today for his AF including antiarrhythmic drug therapy and ablation. Discussed risks, recovery and likelihood of success. Discussed potential need for repeat ablation procedures and antiarrhythmic drugs after an initial ablation. They wish to proceed with scheduling.   Risk, benefits, and alternatives to EP study and radiofrequency ablation for afib were also discussed in detail today. These risks include but are not limited to stroke, bleeding, vascular damage, tamponade, perforation, damage to the esophagus, lungs, and other structures, pulmonary vein stenosis, worsening renal function, and death. The patient understands these risk and wishes to proceed.  We will therefore proceed with catheter ablation at the next available time.  Carto, ICE, anesthesia are requested for the procedure.  Will also obtain CT PV protocol prior to the procedure to exclude LAA thrombus and further evaluate atrial anatomy.   Presents for PVI. Procedure reviewed.    Signed, Lars Mage, MD, [facility], [facility] 01/23/2022 Electrophysiology [facility]

## 2022-01-23 NOTE — Discharge Instructions (Signed)

## 2022-01-24 ENCOUNTER — Encounter (HOSPITAL_COMMUNITY): Payer: Self-pay | Admitting: Cardiology

## 2022-01-24 LAB — POCT ACTIVATED CLOTTING TIME
Activated Clotting Time: 260 seconds
Activated Clotting Time: 363 seconds

## 2022-01-27 ENCOUNTER — Telehealth: Payer: Self-pay | Admitting: Internal Medicine

## 2022-01-27 ENCOUNTER — Encounter: Payer: Self-pay | Admitting: Cardiology

## 2022-01-27 NOTE — Telephone Encounter (Signed)
  Pt would like to speak with Dr. Harrington Challenger or her nurse regarding her recent procedure

## 2022-01-27 NOTE — Telephone Encounter (Signed)
I spoke with the pt and she had some questions re: her pre-ablation ECG and all of her questions were answered. Pt says she is doing well and will keep all of her upcoming appts.

## 2022-01-27 NOTE — Telephone Encounter (Signed)
I spoke with the pt and went over her ECG and she will let us know if she has any further questions.

## 2022-01-30 ENCOUNTER — Telehealth: Payer: Self-pay

## 2022-01-30 NOTE — Telephone Encounter (Signed)
Called to discuss Watchman planning. The patient stated she does not wish to proceed with scheduling implant. Gave her direct number to call should she change her mind. She was grateful for call.

## 2022-02-04 ENCOUNTER — Encounter: Payer: Self-pay | Admitting: Family Medicine

## 2022-02-04 ENCOUNTER — Ambulatory Visit (INDEPENDENT_AMBULATORY_CARE_PROVIDER_SITE_OTHER): Payer: Medicare Other | Admitting: Family Medicine

## 2022-02-04 VITALS — BP 130/80 | HR 70 | Temp 97.7°F | Ht 66.0 in | Wt 127.0 lb

## 2022-02-04 DIAGNOSIS — N898 Other specified noninflammatory disorders of vagina: Secondary | ICD-10-CM | POA: Diagnosis not present

## 2022-02-04 DIAGNOSIS — R300 Dysuria: Secondary | ICD-10-CM

## 2022-02-04 LAB — POCT URINALYSIS DIPSTICK
Bilirubin, UA: NEGATIVE
Blood, UA: POSITIVE
Glucose, UA: NEGATIVE
Ketones, UA: NEGATIVE
Leukocytes, UA: NEGATIVE
Nitrite, UA: NEGATIVE
Protein, UA: NEGATIVE
Spec Grav, UA: 1.01 (ref 1.010–1.025)
Urobilinogen, UA: 0.2 E.U./dL
pH, UA: 7 (ref 5.0–8.0)

## 2022-02-04 MED ORDER — SULFAMETHOXAZOLE-TRIMETHOPRIM 800-160 MG PO TABS: 1.0000 | ORAL_TABLET | Freq: Two times a day (BID) | ORAL | 0 refills | Status: DC

## 2022-02-04 MED ORDER — PHENAZOPYRIDINE HCL 100 MG PO TABS: 100.0000 mg | ORAL_TABLET | Freq: Three times a day (TID) | ORAL | 0 refills | Status: DC | PRN

## 2022-02-04 MED ORDER — FLUCONAZOLE 150 MG PO TABS: 150.0000 mg | ORAL_TABLET | ORAL | 0 refills | Status: DC | PRN

## 2022-02-04 NOTE — Progress Notes (Signed)
Subjective:     Patient ID: Carly Myers, female    DOB: 08-09-1945, 76 y.o.   MRN: [id number]  Chief Complaint  Patient presents with   Dysuria    Started this morning, reoccurring    Urinary Frequency   Urinary Retention    Took a pill she had before for a UTI, doesn't remember the name, feel like it has worked    HPI Dysuria since this am.  Frequency, going small amts.  Did take left-over meds from prev UTI-bactrim.   Some vaginal itch since then as well.  Some d/c.  Minimal odor. No f/c.  No new back pain. No blood.  No n/v.  Cardiac ablation 11/30.  Some intermitt dysuria since.  Cranberry juice.    Health Maintenance Due  Topic Date Due   DTaP/Tdap/Td (1 - Tdap) Never done   Medicare Annual Wellness (AWV)  04/10/2019    Past Medical History:  Diagnosis Date   Allergic rhinitis    Anaphylactic reaction to bee sting    Anxiety state    unspecified   Arthritis    Ortho Dr. Berenice Primas   Disorder of bone    unspecified   Essential hypertension 12/28/2019   Hypercholesteremia    migrated   Hyperlipemia    Long term (current) use of anticoagulants 12/28/2019   PAF   Low back pain    Major depression    in complete remission   Osteopenia    Osteoporosis    PAF (paroxysmal atrial fibrillation) ([place]) 12/28/2019   Post-menopausal    Senile purpura ([place])    Sensorineural hearing loss (SNHL) of both ears 05/08/2021   Dr. Benjamine Mola 2022   Shingles    Sinusitis    UTI (urinary tract infection)    Vitamin D deficiency     Past Surgical History:  Procedure Laterality Date   ATRIAL FIBRILLATION ABLATION N/A 01/23/2022   Procedure: ATRIAL FIBRILLATION ABLATION;  Surgeon: Vickie Epley, MD;  Location: [facility];  Service: Cardiovascular;  Laterality: N/A;   NO PAST SURGERIES      Outpatient Medications Prior to Visit  Medication Sig Dispense Refill   acetaminophen (TYLENOL) 500 MG tablet Take 500 mg by mouth every 6 (six) hours as needed for moderate pain.      Biotin 2500 MCG CAPS Take 2,500 mcg by mouth daily.     Calcium Citrate-Vitamin D (CALCIUM + D PO) Take 1 tablet by mouth daily.     Cholecalciferol (DIALYVITE VITAMIN D 5000) 125 MCG (5000 UT) capsule Take 10,000 Units by mouth daily.     diazepam (VALIUM) 5 MG tablet Take 0.5-1 tablets (2.5-5 mg total) by mouth 2 (two) times daily as needed (vertigo). (Patient taking differently: Take 0.625 mg by mouth 2 (two) times daily as needed (vertigo).) 30 tablet 1   Docusate Calcium (STOOL SOFTENER PO) Take 1 capsule by mouth at bedtime.     EPINEPHrine 0.3 mg/0.3 mL IJ SOAJ injection Inject 0.3 mg into the muscle as needed for anaphylaxis.     Homeopathic Products (LEG CRAMPS PM SL) Place 1 capsule under the tongue at bedtime.     LORazepam (ATIVAN) 0.5 MG tablet Take 1 tablet (0.5 mg total) by mouth at bedtime as needed for sleep. 30 tablet 2   metoprolol succinate (TOPROL-XL) 25 MG 24 hr tablet TAKE 1 TABLET IN THE MORNING AND AT BEDTIME (Patient taking differently: Take 12.5-25 mg by mouth See admin instructions. Take 12.5 mg in the morning  and 25 mg in the evening) 180 tablet 3   pantoprazole (PROTONIX) 40 MG tablet Take 1 tablet (40 mg total) by mouth daily. 45 tablet 0   rosuvastatin (CRESTOR) 40 MG tablet TAKE 1 TABLET DAILY (Patient taking differently: Take 20-40 mg by mouth See admin instructions. Take 20 mg Sun, Tues, Thurs, and Sat Take 40 mg Mon, Wed, Fri) 90 tablet 2   XARELTO 20 MG TABS tablet TAKE 1 TABLET DAILY WITH SUPPER (CONTINUE CURRENT DOSE XARELTO 20 MG PER DOCTOR ROSS) 90 tablet 3   Phenylephrine-APAP-guaiFENesin (MUCINEX SINUS-MAX CONG & PAIN PO) Take 1 tablet by mouth daily as needed (congestion).     Sod Picosulfate-Mag Ox-Cit Acd (CLENPIQ) 10-3.5-12 MG-GM -GM/160ML SOLN Per colonoscopy prep instructions per GI Instructions (Patient not taking: Reported on 02/04/2022) 320 mL 0   colchicine 0.6 MG tablet Take 1 tablet (0.6 mg total) by mouth 2 (two) times daily for 5 days. 10 tablet  0   No facility-administered medications prior to visit.    Allergies  Allergen Reactions   Bee Venom Swelling   ROS neg/noncontributory except as noted HPI/below      Objective:     BP 130/80   Pulse 70   Temp 97.7 F (36.5 C) (Temporal)   Ht '5\' 6"'$  (1.676 m)   Wt 127 lb (57.6 kg)   SpO2 98%   BMI 20.50 kg/m  Wt Readings from Last 3 Encounters:  02/04/22 127 lb (57.6 kg)  01/23/22 125 lb (56.7 kg)  11/21/21 124 lb 3.2 oz (56.3 kg)    Physical Exam   Gen: WDWN NAD HEENT: NCAT, conjunctiva not injected, sclera nonicteric NECK:  supple, no thyromegaly, no nodes, no carotid bruits CARDIAC: RRR, S1S2+, no murmur.  ABDOMEN:  BS+, soft,tender suprapubically.  No CVAT, No HSM, no masses EXT:  no edema MSK: no gross abnormalities.  NEURO: A&O x3.  CN II-XII intact.  PSYCH: normal mood. Good eye contact  Results for orders placed or performed in visit on 02/04/22  POCT urinalysis dipstick  Result Value Ref Range   Color, UA YELLOW    Clarity, UA CLEAr    Glucose, UA Negative Negative   Bilirubin, UA neg    Ketones, UA neg    Spec Grav, UA 1.010 1.010 - 1.025   Blood, UA pos    pH, UA 7.0 5.0 - 8.0   Protein, UA Negative Negative   Urobilinogen, UA 0.2 0.2 or 1.0 E.U./dL   Nitrite, UA neg    Leukocytes, UA Negative Negative   Appearance     Odor           Assessment & Plan:   Problem List Items Addressed This Visit   None Visit Diagnoses     Dysuria    -  Primary   Relevant Orders   POCT urinalysis dipstick   Urine Culture   Vagina itching          UTI-bactrim ds bid x 7d.  Check cx but may be neg as took bactrim this am.  Pyridium tid prn Vaginal itching-poss yeast, vag dryness, etc.  Will do diflucan '150mg'$  onc and repeat in 3-7 days if needed.  If not resolve, needs exam.   Meds ordered this encounter  Medications   sulfamethoxazole-trimethoprim (BACTRIM DS) 800-160 MG tablet    Sig: Take 1 tablet by mouth 2 (two) times daily.    Dispense:  14  tablet    Refill:  0   phenazopyridine (PYRIDIUM) 100 MG  tablet    Sig: Take 1 tablet (100 mg total) by mouth 3 (three) times daily as needed for pain.    Dispense:  6 tablet    Refill:  0   fluconazole (DIFLUCAN) 150 MG tablet    Sig: Take 1 tablet (150 mg total) by mouth every three (3) days as needed.    Dispense:  2 tablet    Refill:  0    Wellington Hampshire, MD

## 2022-02-04 NOTE — Patient Instructions (Signed)
Pyridum three x/day as needed-for burning, gotta go.   Diflucan-yeast-take one now and one in 3-7 days if needed if itching   If itching continues-need to be seen for female exam.  Happy Holidays!

## 2022-02-05 LAB — URINE CULTURE
MICRO NUMBER:: 14303485
Result:: NO GROWTH
SPECIMEN QUALITY:: ADEQUATE

## 2022-02-10 ENCOUNTER — Telehealth: Payer: Self-pay

## 2022-02-10 NOTE — Telephone Encounter (Signed)
The patient reports she thought about it and is ready to schedule the Watchman procedure.  She wishes to proceed with implant on 05/15/2022. Pre-procedure visit scheduled 04/21/2022. She was grateful for assistance.

## 2022-02-11 ENCOUNTER — Other Ambulatory Visit: Payer: Self-pay

## 2022-02-11 DIAGNOSIS — I480 Paroxysmal atrial fibrillation: Secondary | ICD-10-CM

## 2022-02-11 DIAGNOSIS — R269 Unspecified abnormalities of gait and mobility: Secondary | ICD-10-CM

## 2022-02-20 ENCOUNTER — Ambulatory Visit (HOSPITAL_COMMUNITY)
Admission: RE | Admit: 2022-02-20 | Discharge: 2022-02-20 | Disposition: A | Discharge status: Home / Self Care | Payer: Medicare Other | Source: Ambulatory Visit | Attending: Nurse Practitioner | Admitting: Nurse Practitioner

## 2022-02-20 ENCOUNTER — Encounter (HOSPITAL_COMMUNITY): Payer: Self-pay | Admitting: Nurse Practitioner

## 2022-02-20 VITALS — BP 140/72 | HR 56 | Ht 66.0 in | Wt 127.2 lb

## 2022-02-20 DIAGNOSIS — I2510 Atherosclerotic heart disease of native coronary artery without angina pectoris: Secondary | ICD-10-CM | POA: Insufficient documentation

## 2022-02-20 DIAGNOSIS — I480 Paroxysmal atrial fibrillation: Secondary | ICD-10-CM | POA: Diagnosis not present

## 2022-02-20 DIAGNOSIS — D6869 Other thrombophilia: Secondary | ICD-10-CM

## 2022-02-20 DIAGNOSIS — I10 Essential (primary) hypertension: Secondary | ICD-10-CM | POA: Diagnosis not present

## 2022-02-20 DIAGNOSIS — Z79899 Other long term (current) drug therapy: Secondary | ICD-10-CM | POA: Insufficient documentation

## 2022-02-20 DIAGNOSIS — E785 Hyperlipidemia, unspecified: Secondary | ICD-10-CM | POA: Insufficient documentation

## 2022-02-20 DIAGNOSIS — Z7901 Long term (current) use of anticoagulants: Secondary | ICD-10-CM | POA: Insufficient documentation

## 2022-02-20 NOTE — Progress Notes (Signed)
Primary Care Physician: Leamon Arnt, MD Referring Physician: Dr. Quentin Ore Cardiologist: Dr. Melissa Noon Chambers is a 76 y.o. female with a h/o  paroxysmal atrial fibrillation, CAD, hypertension, hyperlipidemia, arthritis, and major depression. She had an afib ablation 11/30 and is in the afib clinic for f/u. She is in SR today and has not noted any sustained afib since the procedure. She is pending having the Watchman done later in the [facility]. No swallowing or groin issues. Back to her usual activities.   Today, she denies symptoms of palpitations, chest pain, shortness of breath, orthopnea, PND, lower extremity edema, dizziness, presyncope, syncope, or neurologic sequela. The patient is tolerating medications without difficulties and is otherwise without complaint today.   Past Medical History:  Diagnosis Date   Allergic rhinitis    Anaphylactic reaction to bee sting    Anxiety state    unspecified   Arthritis    Ortho Dr. Berenice Primas   Disorder of bone    unspecified   Essential hypertension 12/28/2019   Hypercholesteremia    migrated   Hyperlipemia    Long term (current) use of anticoagulants 12/28/2019   PAF   Low back pain    Major depression    in complete remission   Osteopenia    Osteoporosis    PAF (paroxysmal atrial fibrillation) ([place]) 12/28/2019   Post-menopausal    Senile purpura ([place])    Sensorineural hearing loss (SNHL) of both ears 05/08/2021   Dr. Benjamine Mola 2022   Shingles    Sinusitis    UTI (urinary tract infection)    Vitamin D deficiency    Past Surgical History:  Procedure Laterality Date   ATRIAL FIBRILLATION ABLATION N/A 01/23/2022   Procedure: ATRIAL FIBRILLATION ABLATION;  Surgeon: Vickie Epley, MD;  Location: [facility];  Service: Cardiovascular;  Laterality: N/A;   NO PAST SURGERIES      Current Outpatient Medications  Medication Sig Dispense Refill   acetaminophen (TYLENOL) 500 MG tablet Take 500 mg by mouth every 6 (six) hours as  needed for moderate pain.     Biotin 2500 MCG CAPS Take 2,500 mcg by mouth daily.     Calcium Citrate-Vitamin D (CALCIUM + D PO) Take 1 tablet by mouth daily.     Cholecalciferol (DIALYVITE VITAMIN D 5000) 125 MCG (5000 UT) capsule Take 10,000 Units by mouth daily.     diazepam (VALIUM) 5 MG tablet Take 0.5-1 tablets (2.5-5 mg total) by mouth 2 (two) times daily as needed (vertigo). (Patient taking differently: Take 0.625 mg by mouth 2 (two) times daily as needed (vertigo).) 30 tablet 1   Docusate Calcium (STOOL SOFTENER PO) Take 1 capsule by mouth at bedtime.     EPINEPHrine 0.3 mg/0.3 mL IJ SOAJ injection Inject 0.3 mg into the muscle as needed for anaphylaxis.     fluconazole (DIFLUCAN) 150 MG tablet Take 1 tablet (150 mg total) by mouth every three (3) days as needed. 2 tablet 0   Homeopathic Products (LEG CRAMPS PM SL) Place 1 capsule under the tongue at bedtime.     LORazepam (ATIVAN) 0.5 MG tablet Take 1 tablet (0.5 mg total) by mouth at bedtime as needed for sleep. 30 tablet 2   metoprolol succinate (TOPROL-XL) 25 MG 24 hr tablet TAKE 1 TABLET IN THE MORNING AND AT BEDTIME (Patient taking differently: Take 12.5-25 mg by mouth See admin instructions. Take 12.5 mg in the morning and 25 mg in the evening) 180 tablet 3  pantoprazole (PROTONIX) 40 MG tablet Take 1 tablet (40 mg total) by mouth daily. 45 tablet 0   phenazopyridine (PYRIDIUM) 100 MG tablet Take 1 tablet (100 mg total) by mouth 3 (three) times daily as needed for pain. 6 tablet 0   rosuvastatin (CRESTOR) 40 MG tablet TAKE 1 TABLET DAILY (Patient taking differently: Take 20-40 mg by mouth See admin instructions. Take 20 mg Sun, Tues, Thurs, and Sat Take 40 mg Mon, Wed, Fri) 90 tablet 2   Sod Picosulfate-Mag Ox-Cit Acd (CLENPIQ) 10-3.5-12 MG-GM -GM/160ML SOLN Per colonoscopy prep instructions per GI Instructions 320 mL 0   XARELTO 20 MG TABS tablet TAKE 1 TABLET DAILY WITH SUPPER (CONTINUE CURRENT DOSE XARELTO 20 MG PER DOCTOR ROSS)  90 tablet 3   No current facility-administered medications for this encounter.    Allergies  Allergen Reactions   Bee Venom Swelling    Social History   Socioeconomic History   Marital status: Widowed    Spouse name: Not on file   Number of children: 0   Years of education: Not on file   Highest education level: Not on file  Occupational History   Not on file  Tobacco Use   Smoking status: Former    Types: Cigarettes    Quit date: 02/25/2008    Years since quitting: 13.9   Smokeless tobacco: Never  Vaping Use   Vaping Use: Never used  Substance and Sexual Activity   Alcohol use: Yes    Comment: wine-1 drink nightly w/ dinner   Drug use: Never   Sexual activity: Not Currently    Birth control/protection: Post-menopausal  Other Topics Concern   Not on file  Social History Narrative   Right handed   Caffeine use: rare   Social Determinants of Health   Financial Resource Strain: Not on file  Food Insecurity: Not on file  Transportation Needs: Not on file  Physical Activity: Not on file  Stress: Not on file  Social Connections: Not on file  Intimate Partner Violence: Not on file    Family History  Problem Relation Age of Onset   Colon cancer Neg Hx    Esophageal cancer Neg Hx    Rectal cancer Neg Hx    Stomach cancer Neg Hx     ROS- All systems are reviewed and negative except as per the HPI above  Physical Exam: Vitals:   02/20/22 1015  BP: (!) 140/72  Pulse: (!) 56  Weight: 57.7 kg  Height: '5\' 6"'$  (1.676 m)   Wt Readings from Last 3 Encounters:  02/20/22 57.7 kg  02/04/22 57.6 kg  01/23/22 56.7 kg    Labs: Lab Results  Component Value Date   NA 140 01/02/2022   K 5.0 01/02/2022   CL 104 01/02/2022   CO2 26 01/02/2022   GLUCOSE 97 01/02/2022   BUN 17 01/02/2022   CREATININE 0.82 01/02/2022   CALCIUM 9.3 01/02/2022   MG 2.0 05/09/2021   No results found for: "INR" Lab Results  Component Value Date   CHOL 158 05/09/2021   HDL 62.40  05/09/2021   LDLCALC 79 05/09/2021   TRIG 83.0 05/09/2021     GEN- The patient is well appearing, alert and oriented x 3 today.   Head- normocephalic, atraumatic Eyes-  Sclera clear, conjunctiva pink Ears- hearing intact Oropharynx- clear Neck- supple, no JVP Lymph- no cervical lymphadenopathy Lungs- Clear to ausculation bilaterally, normal work of breathing Heart- Regular rate and rhythm, no murmurs, rubs or gallops, PMI  not laterally displaced GI- soft, NT, ND, + BS Extremities- no clubbing, cyanosis, or edema MS- no significant deformity or atrophy Skin- no rash or lesion Psych- euthymic mood, full affect Neuro- strength and sensation are intact  EKG-Vent. rate 56 BPM PR interval 154 ms QRS duration 78 ms QT/QTcB 424/409 ms P-R-T axes 69 -30 80 Sinus bradycardia Left axis deviation Septal infarct , age undetermined Abnormal ECG When compared with ECG of 23-Jan-2022 09:48, PREVIOUS ECG IS PRESENT    Assessment and Plan:  1. Afib S/p afib ablation 11/30 and is doing well staying in SR.  Continue metoprolol succinate 25 mg bid  2. CHA2DS2VASc  score of 6 Continue xarelto 20 mg without interruption Pending Watchman implant  in February   Demorris Carly Myers C. Yaslyn Cumby, [facility] [address] [place], [place] [address] [phone]

## 2022-02-26 ENCOUNTER — Telehealth: Payer: Self-pay

## 2022-03-07 ENCOUNTER — Telehealth (INDEPENDENT_AMBULATORY_CARE_PROVIDER_SITE_OTHER): Payer: Medicare Other | Admitting: Family Medicine

## 2022-03-07 ENCOUNTER — Encounter: Payer: Self-pay | Admitting: Family Medicine

## 2022-03-07 VITALS — Ht 66.0 in | Wt 125.0 lb

## 2022-03-07 DIAGNOSIS — B9689 Other specified bacterial agents as the cause of diseases classified elsewhere: Secondary | ICD-10-CM | POA: Diagnosis not present

## 2022-03-07 DIAGNOSIS — J208 Acute bronchitis due to other specified organisms: Secondary | ICD-10-CM | POA: Diagnosis not present

## 2022-03-07 MED ORDER — ROSUVASTATIN CALCIUM 40 MG PO TABS: ORAL_TABLET | ORAL | Status: DC

## 2022-03-07 MED ORDER — METOPROLOL SUCCINATE ER 25 MG PO TB24: ORAL_TABLET | ORAL | Status: DC

## 2022-03-07 MED ORDER — AZITHROMYCIN 250 MG PO TABS: ORAL_TABLET | ORAL | 0 refills | Status: DC

## 2022-03-07 NOTE — Progress Notes (Signed)
Virtual Visit via Video Note  Subjective  CC:  Chief Complaint  Patient presents with   Cough    Pt stated that she has had a cough for the past week and has not gotten any better.      I connected with Liliani Bobo on 03/07/22 at 11:00 AM EST by a video enabled telemedicine application and verified that I am speaking with the correct person using two identifiers. Location patient: Home Location provider: [facility] [place], Office Persons participating in the virtual visit: Synthia Fairbank, Leamon Arnt, MD Darlina Rumpf CMA  I discussed the limitations of evaluation and management by telemedicine and the availability of in person appointments. The patient expressed understanding and agreed to proceed. HPI: Carly Myers is a 77 y.o. female who was contacted today to address the problems listed above in the chief complaint. 77 year old with history of A-fib on Eliquis, hypertension and hyperlipidemia presents due to 1 week history of productive cough, started with cold symptoms but now into the chest.  Yellow productive cough ongoing, not really getting better.  No fevers.  No chills.  No headache.  No chest pain.  Has not tested for COVID.  No lung disease, wheezing or shortness of breath Assessment  1. Acute bacterial bronchitis      Plan  Bronchitis: Treat as if bacterial given duration of symptoms, age and patient comorbidity.  Z-Pak, continue Delsym and supportive care.  Follow-up if unimproved. I discussed the assessment and treatment plan with the patient. The patient was provided an opportunity to ask questions and all were answered. The patient agreed with the plan and demonstrated an understanding of the instructions.   The patient was advised to call back or seek an in-person evaluation if the symptoms worsen or if the condition fails to improve as anticipated. Follow up: As scheduled 05/30/2022  Meds ordered this encounter  Medications    metoprolol succinate (TOPROL-XL) 25 MG 24 hr tablet    Sig: Take 12.5 mg in the morning and 25 mg in the evening   rosuvastatin (CRESTOR) 40 MG tablet    Sig: Take 20 mg Sun, Tues, Thurs, and Sat Take 40 mg Mon, Wed, Fri   azithromycin (ZITHROMAX) 250 MG tablet    Sig: Take 2 tabs today, then 1 tab daily for 4 days    Dispense:  1 each    Refill:  0      I reviewed the patients updated PMH, FH, and SocHx.    Patient Active Problem List   Diagnosis Date Noted   Vestibular vertigo 10/26/2020    Priority: High   Cerebral vascular disease 10/26/2020    Priority: High   Essential hypertension 12/28/2019    Priority: High   PAF (paroxysmal atrial fibrillation) ([place]) 12/28/2019    Priority: High   Long term (current) use of anticoagulants 12/28/2019    Priority: High   Mixed hyperlipidemia 03/25/2017    Priority: High   Osteopenia 03/25/2017    Priority: Medium    Positive colorectal cancer screening using Cologuard test with normal colonoscopy 6/23 11/21/2021    Priority: Low   Sensorineural hearing loss (SNHL) of both ears 05/08/2021    Priority: Low   Vitamin deficiency 03/25/2017    Priority: Low   Senile purpura ([place]) 03/25/2017    Priority: Low   Allergic rhinitis 03/25/2017    Priority: Low   Current Meds  Medication Sig   acetaminophen (TYLENOL) 500 MG  tablet Take 500 mg by mouth every 6 (six) hours as needed for moderate pain.   azithromycin (ZITHROMAX) 250 MG tablet Take 2 tabs today, then 1 tab daily for 4 days   Biotin 2500 MCG CAPS Take 2,500 mcg by mouth daily.   Calcium Citrate-Vitamin D (CALCIUM + D PO) Take 1 tablet by mouth daily.   Cholecalciferol (DIALYVITE VITAMIN D 5000) 125 MCG (5000 UT) capsule Take 10,000 Units by mouth daily.   diazepam (VALIUM) 5 MG tablet Take 0.5-1 tablets (2.5-5 mg total) by mouth 2 (two) times daily as needed (vertigo). (Patient taking differently: Take 0.625 mg by mouth 2 (two) times daily as needed (vertigo).)   Docusate  Calcium (STOOL SOFTENER PO) Take 1 capsule by mouth at bedtime.   EPINEPHrine 0.3 mg/0.3 mL IJ SOAJ injection Inject 0.3 mg into the muscle as needed for anaphylaxis.   Homeopathic Products (LEG CRAMPS PM SL) Place 1 capsule under the tongue at bedtime.   LORazepam (ATIVAN) 0.5 MG tablet Take 1 tablet (0.5 mg total) by mouth at bedtime as needed for sleep.   pantoprazole (PROTONIX) 40 MG tablet Take 1 tablet (40 mg total) by mouth daily.   phenazopyridine (PYRIDIUM) 100 MG tablet Take 1 tablet (100 mg total) by mouth 3 (three) times daily as needed for pain.   Sod Picosulfate-Mag Ox-Cit Acd (CLENPIQ) 10-3.5-12 MG-GM -GM/160ML SOLN Per colonoscopy prep instructions per GI Instructions   XARELTO 20 MG TABS tablet TAKE 1 TABLET DAILY WITH SUPPER (CONTINUE CURRENT DOSE XARELTO 20 MG PER DOCTOR ROSS)   [DISCONTINUED] fluconazole (DIFLUCAN) 150 MG tablet Take 1 tablet (150 mg total) by mouth every three (3) days as needed.   [DISCONTINUED] metoprolol succinate (TOPROL-XL) 25 MG 24 hr tablet TAKE 1 TABLET IN THE MORNING AND AT BEDTIME (Patient taking differently: Take 12.5-25 mg by mouth See admin instructions. Take 12.5 mg in the morning and 25 mg in the evening)   [DISCONTINUED] rosuvastatin (CRESTOR) 40 MG tablet TAKE 1 TABLET DAILY (Patient taking differently: Take 20-40 mg by mouth See admin instructions. Take 20 mg Sun, Tues, Thurs, and Sat Take 40 mg Mon, Wed, Fri)    Allergies: Patient is allergic to bee venom. Family History: Patient family history is not on file. Social History:  Patient  reports that she quit smoking about 14 years ago. Her smoking use included cigarettes. She has never used smokeless tobacco. She reports current alcohol use. She reports that she does not use drugs.  Review of Systems: Constitutional: Negative for fever malaise or anorexia Cardiovascular: negative for chest pain Respiratory: negative for SOB or persistent cough Gastrointestinal: negative for abdominal  pain  OBJECTIVE Vitals: Ht '5\' 6"'$  (1.676 m)   Wt 125 lb (56.7 kg)   BMI 20.18 kg/m  General: no acute distress , A&Ox3, no respiratory distress  Leamon Arnt, MD

## 2022-03-17 DIAGNOSIS — B078 Other viral warts: Secondary | ICD-10-CM | POA: Diagnosis not present

## 2022-03-17 DIAGNOSIS — I788 Other diseases of capillaries: Secondary | ICD-10-CM | POA: Diagnosis not present

## 2022-03-17 DIAGNOSIS — L821 Other seborrheic keratosis: Secondary | ICD-10-CM | POA: Diagnosis not present

## 2022-03-17 DIAGNOSIS — Z85828 Personal history of other malignant neoplasm of skin: Secondary | ICD-10-CM | POA: Diagnosis not present

## 2022-03-17 DIAGNOSIS — D485 Neoplasm of uncertain behavior of skin: Secondary | ICD-10-CM | POA: Diagnosis not present

## 2022-04-03 ENCOUNTER — Encounter (HOSPITAL_COMMUNITY): Payer: Self-pay | Admitting: *Deleted

## 2022-04-18 NOTE — Progress Notes (Unsigned)
[facility]                                     Cardiology Office Note:    Date:  04/18/2022   ID:  Carly Myers, DOB November 25, 1945, MRN [id number]  PCP:  Leamon Arnt, MD  [facility] Cardiologist:  Dorris Carnes, MD  [facility] Electrophysiologist:  Vickie Epley, MD   Referring MD: Leamon Arnt, MD   No chief complaint on file. ***  History of Present Illness:    Carly Myers is a 77 y.o. female with a hx of paroxysmal atrial fibrillation, CAD, hypertension, hyperlipidemia, arthritis, and major depression.   She underwent AF ablation 01/23/22 with Dr. Quentin Ore and did well with this. In follow up, she was in NSR and had not note any AF recurrence. Plan was to proceed with Watchman implant once healed from her ablation.   Today     1. Afib S/p afib ablation 11/30 and is doing well staying in SR.  Continue metoprolol succinate 25 mg bid   2. CHA2DS2VASc  score of 6 Continue xarelto 20 mg without interruption Pending Watchman implant  in February     Past Medical History:  Diagnosis Date   Allergic rhinitis    Anaphylactic reaction to bee sting    Anxiety state    unspecified   Arthritis    Ortho Dr. Berenice Primas   Disorder of bone    unspecified   Essential hypertension 12/28/2019   Hypercholesteremia    migrated   Hyperlipemia    Long term (current) use of anticoagulants 12/28/2019   PAF   Low back pain    Major depression    in complete remission   Osteopenia    Osteoporosis    PAF (paroxysmal atrial fibrillation) ([place]) 12/28/2019   Post-menopausal    Senile purpura ([place])    Sensorineural hearing loss (SNHL) of both ears 05/08/2021   Dr. Benjamine Mola 2022   Shingles    Sinusitis    UTI (urinary tract infection)    Vitamin D deficiency     Past Surgical History:  Procedure Laterality Date   ATRIAL FIBRILLATION ABLATION N/A 01/23/2022   Procedure: ATRIAL FIBRILLATION ABLATION;  Surgeon: Vickie Epley, MD;  Location: [place] [facility];   Service: Cardiovascular;  Laterality: N/A;   NO PAST SURGERIES      Current Medications: No outpatient medications have been marked as taking for the 04/21/22 encounter (Appointment) with [facility].     Allergies:   Bee venom   Social History   Socioeconomic History   Marital status: Widowed    Spouse name: Not on file   Number of children: 0   Years of education: Not on file   Highest education level: Not on file  Occupational History   Not on file  Tobacco Use   Smoking status: Former    Types: Cigarettes    Quit date: 02/25/2008    Years since quitting: 14.1   Smokeless tobacco: Never  Vaping Use   Vaping Use: Never used  Substance and Sexual Activity   Alcohol use: Yes    Comment: wine-1 drink nightly w/ dinner   Drug use: Never   Sexual activity: Not Currently    Birth control/protection: Post-menopausal  Other Topics Concern   Not on file  Social History Narrative   Right handed   Caffeine use:  rare   Social Determinants of Health   Financial Resource Strain: Not on file  Food Insecurity: Not on file  Transportation Needs: Not on file  Physical Activity: Not on file  Stress: Not on file  Social Connections: Not on file     Family History: The patient's ***family history is negative for Colon cancer, Esophageal cancer, Rectal cancer, and Stomach cancer.  ROS:   Please see the history of present illness.    All other systems reviewed and are negative.  EKGs/Labs/Other Studies Reviewed:    The following studies were reviewed today: ***  EKG:  EKG is *** ordered today.  The ekg ordered today demonstrates ***  Recent Labs: 05/09/2021: ALT 15; Magnesium 2.0; TSH 1.18 01/02/2022: BUN 17; Creatinine, Ser 0.82; Hemoglobin 12.6; Platelets 311; Potassium 5.0; Sodium 140  Recent Lipid Panel    Component Value Date/Time   CHOL 158 05/09/2021 0921   CHOL 143 03/28/2019 1407   TRIG 83.0 05/09/2021 0921   HDL 62.40 05/09/2021 0921   HDL  63 03/28/2019 1407   CHOLHDL 3 05/09/2021 0921   VLDL 16.6 05/09/2021 0921   LDLCALC 79 05/09/2021 0921   LDLCALC 66 03/28/2019 1407     Risk Assessment/Calculations:   {Does this patient have ATRIAL FIBRILLATION?[phone]}    HAS-BLED score 2 Hypertension - n Abnormal renal and liver function (Dialysis, transplant, Cr >2.26 mg/dL /Cirrhosis or Bilirubin >2x Normal or AST/ALT/AP >3x Normal) No  Stroke Yes  Bleeding No  Labile INR (Unstable/high INR) No  Elderly (>65) y Drugs or alcohol (? 8 drinks/week, anti-plt or NSAID) n   CHA2DS2-VASc Score = 7  The patient's score is based upon: CHF History: 0 HTN History: 1 Diabetes History: 0 Stroke History: 2 Vascular Disease History: 1 Age Score: 2 Gender Score: 1     CHA2DS2-VASc Score = 7 [CHF History: 0, HTN History: 1, Diabetes History: 0, Stroke History: 2, Vascular Disease History: 1, Age Score: 2, Gender Score: 1].  Therefore, the patient's annual risk of stroke is 11.2 %.        HAS-BLED score *** Hypertension (Uncontrolled in 30 days)  {YES/NO:21197} Abnormal renal and liver function (Dialysis, transplant, Cr >2.26 mg/dL /Cirrhosis or Bilirubin >2x Normal or AST/ALT/AP >3x Normal) {YES/NO:21197} Stroke {YES/NO:21197} Bleeding {YES/NO:21197} Labile INR (Unstable/high INR) {YES/NO:21197} Elderly (>65) {YES/NO:21197} Drugs or alcohol (? 8 drinks/week, anti-plt or NSAID) {YES/NO:21197}   Physical Exam:    VS:  There were no vitals taken for this visit.    Wt Readings from Last 3 Encounters:  03/07/22 125 lb (56.7 kg)  02/20/22 127 lb 3.2 oz (57.7 kg)  02/04/22 127 lb (57.6 kg)     GEN: *** Well nourished, well developed in no acute distress HEENT: Normal NECK: No JVD; No carotid bruits LYMPHATICS: No lymphadenopathy CARDIAC: ***RRR, no murmurs, rubs, gallops RESPIRATORY:  Clear to auscultation without rales, wheezing or rhonchi  ABDOMEN: Soft, non-tender, non-distended MUSCULOSKELETAL:  No edema; No  deformity  SKIN: Warm and dry NEUROLOGIC:  Alert and oriented x 3 PSYCHIATRIC:  Normal affect   ASSESSMENT:    No diagnosis found. PLAN:    In order of problems listed above:       {Are you ordering a CV Procedure (e.g. stress test, cath, DCCV, TEE, etc)?   Press F2        :[id number]    Medication Adjustments/Labs and Tests Ordered: Current medicines are reviewed at length with the patient today.  Concerns regarding medicines are outlined above.  No orders of the defined types were placed in this encounter.  No orders of the defined types were placed in this encounter.   There are no Patient Instructions on file for this visit.   Signed, Kathyrn Drown, NP  04/18/2022 2:31 PM    [facility]

## 2022-04-21 ENCOUNTER — Ambulatory Visit: Payer: Medicare Other | Attending: Cardiology | Admitting: Cardiology

## 2022-04-21 VITALS — BP 114/80 | HR 64 | Ht 66.0 in | Wt 126.2 lb

## 2022-04-21 DIAGNOSIS — M1990 Unspecified osteoarthritis, unspecified site: Secondary | ICD-10-CM

## 2022-04-21 DIAGNOSIS — I10 Essential (primary) hypertension: Secondary | ICD-10-CM

## 2022-04-21 DIAGNOSIS — I480 Paroxysmal atrial fibrillation: Secondary | ICD-10-CM | POA: Diagnosis not present

## 2022-04-21 DIAGNOSIS — Z01818 Encounter for other preprocedural examination: Secondary | ICD-10-CM | POA: Diagnosis not present

## 2022-04-21 NOTE — Patient Instructions (Signed)
Medication Instructions:  Your physician recommends that you continue on your current medications as directed. Please refer to the Current Medication list given to you today.  *If you need a refill on your cardiac medications before your next appointment, please call your pharmacy*   Lab Work: TODAY: BMET, CBC If you have labs (blood work) drawn today and your tests are completely normal, you will receive your results only by: [place] (if you have MyChart) OR A paper copy in the mail If you have any lab test that is abnormal or we need to change your treatment, we will call you to review the results.   Testing/Procedures: SEE INSTRUCTION LETTER   Follow-Up: At [facility], you and your health needs are our priority.  As part of our continuing mission to provide you with exceptional heart care, we have created designated Provider Care Teams.  These Care Teams include your primary Cardiologist (physician) and Advanced Practice Providers (APPs -  Physician Assistants and Nurse Practitioners) who all work together to provide you with the care you need, when you need it.  We recommend signing up for the patient portal called "MyChart".  Sign up information is provided on this After Visit Summary.  MyChart is used to connect with patients for Virtual Visits (Telemedicine).  Patients are able to view lab/test results, encounter notes, upcoming appointments, etc.  Non-urgent messages can be sent to your provider as well.   To learn more about what you can do with MyChart, go to [email or web address].    Your next appointment:   KEEP SCHEDULED FOLLOW-UP

## 2022-04-22 LAB — BASIC METABOLIC PANEL
BUN/Creatinine Ratio: 20 (ref 12–28)
BUN: 16 mg/dL (ref 8–27)
CO2: 25 mmol/L (ref 20–29)
Calcium: 9.8 mg/dL (ref 8.7–10.3)
Chloride: 103 mmol/L (ref 96–106)
Creatinine, Ser: 0.82 mg/dL (ref 0.57–1.00)
Glucose: 98 mg/dL (ref 70–99)
Potassium: 5 mmol/L (ref 3.5–5.2)
Sodium: 141 mmol/L (ref 134–144)
eGFR: 73 mL/min/{1.73_m2} (ref >59)

## 2022-04-22 LAB — CBC
Hematocrit: 41.4 % (ref 34.0–46.6)
Hemoglobin: 13.6 g/dL (ref 11.1–15.9)
MCH: 32.5 pg (ref 26.6–33.0)
MCHC: 32.9 g/dL (ref 31.5–35.7)
MCV: 99 fL — ABNORMAL HIGH (ref 79–97)
Platelets: 336 10*3/uL (ref 150–450)
RBC: 4.19 x10E6/uL (ref 3.77–5.28)
RDW: 12.4 % (ref 11.7–15.4)
WBC: 8.2 10*3/uL (ref 3.4–10.8)

## 2022-05-01 DIAGNOSIS — M1712 Unilateral primary osteoarthritis, left knee: Secondary | ICD-10-CM | POA: Diagnosis not present

## 2022-05-12 ENCOUNTER — Telehealth: Payer: Self-pay

## 2022-05-12 NOTE — Telephone Encounter (Signed)
Confirmed procedure date of 05/15/2022. Confirmed arrival time of 0530 for procedure time at 0730. Reviewed pre-procedure instructions with patient. The patient understands to call if questions/concerns arise prior to procedure.

## 2022-05-12 NOTE — Telephone Encounter (Signed)
Left message to call back  

## 2022-05-15 ENCOUNTER — Inpatient Hospital Stay (HOSPITAL_COMMUNITY): Payer: Medicare Other | Admitting: Physician Assistant

## 2022-05-15 ENCOUNTER — Encounter (HOSPITAL_COMMUNITY): Payer: Self-pay | Admitting: Cardiology

## 2022-05-15 ENCOUNTER — Inpatient Hospital Stay (HOSPITAL_COMMUNITY)
Admission: RE | Admit: 2022-05-15 | Discharge: 2022-05-15 | DRG: 274 | Disposition: A | Discharge status: Home / Self Care | Payer: Medicare Other | Attending: Cardiology | Admitting: Cardiology

## 2022-05-15 ENCOUNTER — Inpatient Hospital Stay (HOSPITAL_COMMUNITY): Payer: Medicare Other

## 2022-05-15 ENCOUNTER — Inpatient Hospital Stay (HOSPITAL_COMMUNITY)
Admission: RE | Disposition: A | Discharge status: Home / Self Care | Payer: Self-pay | Source: Home / Self Care | Attending: Cardiology

## 2022-05-15 ENCOUNTER — Other Ambulatory Visit: Payer: Self-pay

## 2022-05-15 DIAGNOSIS — I2510 Atherosclerotic heart disease of native coronary artery without angina pectoris: Secondary | ICD-10-CM | POA: Diagnosis not present

## 2022-05-15 DIAGNOSIS — Z87891 Personal history of nicotine dependence: Secondary | ICD-10-CM

## 2022-05-15 DIAGNOSIS — I480 Paroxysmal atrial fibrillation: Secondary | ICD-10-CM

## 2022-05-15 DIAGNOSIS — I739 Peripheral vascular disease, unspecified: Secondary | ICD-10-CM | POA: Diagnosis not present

## 2022-05-15 DIAGNOSIS — M1990 Unspecified osteoarthritis, unspecified site: Secondary | ICD-10-CM | POA: Diagnosis not present

## 2022-05-15 DIAGNOSIS — Z79899 Other long term (current) drug therapy: Secondary | ICD-10-CM | POA: Diagnosis not present

## 2022-05-15 DIAGNOSIS — Z7901 Long term (current) use of anticoagulants: Secondary | ICD-10-CM | POA: Diagnosis not present

## 2022-05-15 DIAGNOSIS — F411 Generalized anxiety disorder: Secondary | ICD-10-CM | POA: Diagnosis present

## 2022-05-15 DIAGNOSIS — I4891 Unspecified atrial fibrillation: Principal | ICD-10-CM

## 2022-05-15 DIAGNOSIS — H903 Sensorineural hearing loss, bilateral: Secondary | ICD-10-CM | POA: Diagnosis present

## 2022-05-15 DIAGNOSIS — Z91030 Bee allergy status: Secondary | ICD-10-CM | POA: Diagnosis not present

## 2022-05-15 DIAGNOSIS — D692 Other nonthrombocytopenic purpura: Secondary | ICD-10-CM | POA: Diagnosis present

## 2022-05-15 DIAGNOSIS — M810 Age-related osteoporosis without current pathological fracture: Secondary | ICD-10-CM | POA: Diagnosis not present

## 2022-05-15 DIAGNOSIS — I10 Essential (primary) hypertension: Secondary | ICD-10-CM | POA: Diagnosis not present

## 2022-05-15 DIAGNOSIS — Z006 Encounter for examination for normal comparison and control in clinical research program: Secondary | ICD-10-CM | POA: Diagnosis not present

## 2022-05-15 DIAGNOSIS — E559 Vitamin D deficiency, unspecified: Secondary | ICD-10-CM | POA: Diagnosis present

## 2022-05-15 DIAGNOSIS — Z01818 Encounter for other preprocedural examination: Secondary | ICD-10-CM | POA: Diagnosis not present

## 2022-05-15 DIAGNOSIS — E7800 Pure hypercholesterolemia, unspecified: Secondary | ICD-10-CM | POA: Diagnosis not present

## 2022-05-15 DIAGNOSIS — Z95818 Presence of other cardiac implants and grafts: Secondary | ICD-10-CM | POA: Insufficient documentation

## 2022-05-15 DIAGNOSIS — R269 Unspecified abnormalities of gait and mobility: Secondary | ICD-10-CM

## 2022-05-15 DIAGNOSIS — E782 Mixed hyperlipidemia: Secondary | ICD-10-CM | POA: Diagnosis present

## 2022-05-15 HISTORY — PX: LEFT ATRIAL APPENDAGE OCCLUSION: EP1229

## 2022-05-15 HISTORY — PX: TEE WITHOUT CARDIOVERSION: SHX5443

## 2022-05-15 HISTORY — DX: Presence of other cardiac implants and grafts: Z95.818

## 2022-05-15 LAB — TYPE AND SCREEN
ABO/RH(D): O POS
Antibody Screen: NEGATIVE

## 2022-05-15 LAB — ECHO TEE

## 2022-05-15 LAB — ABO/RH: ABO/RH(D): O POS

## 2022-05-15 LAB — POCT ACTIVATED CLOTTING TIME: Activated Clotting Time: 277 seconds

## 2022-05-15 SURGERY — LEFT ATRIAL APPENDAGE OCCLUSION
Anesthesia: General

## 2022-05-15 MED ORDER — CHLORHEXIDINE GLUCONATE 4 % EX LIQD: Freq: Once | CUTANEOUS | Status: DC

## 2022-05-15 MED ORDER — ACETAMINOPHEN 500 MG PO TABS
1000.0000 mg | ORAL_TABLET | Freq: Once | ORAL | Status: AC
  Administered 2022-05-15: 1000 mg via ORAL
  Filled 2022-05-15: qty 2

## 2022-05-15 MED ORDER — SODIUM CHLORIDE 0.9% FLUSH: 3.0000 mL | Freq: Two times a day (BID) | INTRAVENOUS | Status: DC

## 2022-05-15 MED ORDER — SODIUM CHLORIDE 0.9 % IV SOLN: INTRAVENOUS | Status: DC

## 2022-05-15 MED ORDER — FENTANYL CITRATE (PF) 250 MCG/5ML IJ SOLN
INTRAMUSCULAR | Status: DC | PRN
  Administered 2022-05-15: 100 ug via INTRAVENOUS

## 2022-05-15 MED ORDER — IOHEXOL 350 MG/ML SOLN
INTRAVENOUS | Status: DC | PRN
  Administered 2022-05-15: 20 mL

## 2022-05-15 MED ORDER — LIDOCAINE 2% (20 MG/ML) 5 ML SYRINGE
INTRAMUSCULAR | Status: DC | PRN
  Administered 2022-05-15: 40 mg via INTRAVENOUS

## 2022-05-15 MED ORDER — LACTATED RINGERS IV SOLN: INTRAVENOUS | Status: DC | PRN

## 2022-05-15 MED ORDER — SODIUM CHLORIDE 0.9% FLUSH: 3.0000 mL | INTRAVENOUS | Status: DC | PRN

## 2022-05-15 MED ORDER — ONDANSETRON HCL 4 MG/2ML IJ SOLN
INTRAMUSCULAR | Status: DC | PRN
  Administered 2022-05-15: 4 mg via INTRAVENOUS

## 2022-05-15 MED ORDER — PROTAMINE SULFATE 10 MG/ML IV SOLN
INTRAVENOUS | Status: DC | PRN
  Administered 2022-05-15: 30 mg via INTRAVENOUS

## 2022-05-15 MED ORDER — CHLORHEXIDINE GLUCONATE 0.12 % MT SOLN: 15.0000 mL | OROMUCOSAL | Status: AC

## 2022-05-15 MED ORDER — HEPARIN (PORCINE) IN NACL 1000-0.9 UT/500ML-% IV SOLN
INTRAVENOUS | Status: DC | PRN
  Administered 2022-05-15: 500 mL

## 2022-05-15 MED ORDER — PHENYLEPHRINE HCL-NACL 20-0.9 MG/250ML-% IV SOLN
INTRAVENOUS | Status: DC | PRN
  Administered 2022-05-15: 40 ug/min via INTRAVENOUS

## 2022-05-15 MED ORDER — SODIUM CHLORIDE 0.9 % IV SOLN: 250.0000 mL | INTRAVENOUS | Status: DC | PRN

## 2022-05-15 MED ORDER — FENTANYL CITRATE (PF) 100 MCG/2ML IJ SOLN
INTRAMUSCULAR | Status: AC
  Filled 2022-05-15: qty 2

## 2022-05-15 MED ORDER — ONDANSETRON HCL 4 MG/2ML IJ SOLN: 4.0000 mg | Freq: Four times a day (QID) | INTRAMUSCULAR | Status: DC | PRN

## 2022-05-15 MED ORDER — PROPOFOL 10 MG/ML IV BOLUS
INTRAVENOUS | Status: DC | PRN
  Administered 2022-05-15: 120 mg via INTRAVENOUS

## 2022-05-15 MED ORDER — ROCURONIUM BROMIDE 10 MG/ML (PF) SYRINGE
PREFILLED_SYRINGE | INTRAVENOUS | Status: DC | PRN
  Administered 2022-05-15: 40 mg via INTRAVENOUS

## 2022-05-15 MED ORDER — CEFAZOLIN SODIUM-DEXTROSE 2-4 GM/100ML-% IV SOLN
2.0000 g | INTRAVENOUS | Status: AC
  Administered 2022-05-15: 2 g via INTRAVENOUS
  Filled 2022-05-15: qty 100

## 2022-05-15 MED ORDER — PHENYLEPHRINE 80 MCG/ML (10ML) SYRINGE FOR IV PUSH (FOR BLOOD PRESSURE SUPPORT)
PREFILLED_SYRINGE | INTRAVENOUS | Status: DC | PRN
  Administered 2022-05-15 (×2): 80 ug via INTRAVENOUS

## 2022-05-15 MED ORDER — CHLORHEXIDINE GLUCONATE 0.12 % MT SOLN
OROMUCOSAL | Status: AC
  Administered 2022-05-15: 15 mL via OROMUCOSAL
  Filled 2022-05-15: qty 15

## 2022-05-15 MED ORDER — DEXAMETHASONE SODIUM PHOSPHATE 10 MG/ML IJ SOLN
INTRAMUSCULAR | Status: DC | PRN
  Administered 2022-05-15: 10 mg via INTRAVENOUS

## 2022-05-15 MED ORDER — ACETAMINOPHEN 325 MG PO TABS: 650.0000 mg | ORAL_TABLET | ORAL | Status: DC | PRN

## 2022-05-15 MED ORDER — HEPARIN (PORCINE) IN NACL 2000-0.9 UNIT/L-% IV SOLN
INTRAVENOUS | Status: DC | PRN
  Administered 2022-05-15: 1000 mL

## 2022-05-15 MED ORDER — HEPARIN SODIUM (PORCINE) 1000 UNIT/ML IJ SOLN
INTRAMUSCULAR | Status: DC | PRN
  Administered 2022-05-15: 9000 [IU] via INTRAVENOUS

## 2022-05-15 MED ORDER — SUGAMMADEX SODIUM 200 MG/2ML IV SOLN
INTRAVENOUS | Status: DC | PRN
  Administered 2022-05-15: 200 mg via INTRAVENOUS

## 2022-05-15 SURGICAL SUPPLY — 18 items
CATH INFINITI 5FR ANG PIGTAIL (CATHETERS) IMPLANT
CLOSURE PERCLOSE PROSTYLE (VASCULAR PRODUCTS) IMPLANT
DEVICE WATCHMAN FLX PROC (KITS) IMPLANT
DILATOR VESSEL 38 20CM 14FR (INTRODUCER) IMPLANT
KIT HEART LEFT (KITS) ×2 IMPLANT
KIT SHEA VERSACROSS LAAC CONNE (KITS) IMPLANT
PACK CARDIAC CATHETERIZATION (CUSTOM PROCEDURE TRAY) ×2 IMPLANT
PAD DEFIB RADIO PHYSIO CONN (PAD) ×2 IMPLANT
SHEATH PERFORMER 16FR 30 (SHEATH) IMPLANT
SHEATH PINNACLE 8F 10CM (SHEATH) IMPLANT
SHEATH PROBE COVER 6X72 (BAG) IMPLANT
SYS WATCHMAN FXD DBL (SHEATH) ×1
SYSTEM WATCHMAN FXD DBL (SHEATH) IMPLANT
TRANSDUCER W/STOPCOCK (MISCELLANEOUS) ×2 IMPLANT
TUBING CIL FLEX 10 FLL-RA (TUBING) ×2 IMPLANT
WATCHMAN FLX 20 (Prosthesis & Implant Heart) IMPLANT
WATCHMAN FLX PROCEDURE DEVICE (KITS) ×1 IMPLANT
WATCHMAN PROCED TRUSEAL ACCESS (SHEATH) IMPLANT

## 2022-05-15 NOTE — Progress Notes (Signed)
  [place] VALVE TEAM  Patient doing well s/p LAAO closure. He is hemodynamically stable. Groin site is stable. Plan for early ambulation after bedrest completed and hopeful discharge later today.   Kathyrn Drown NP-C Structural Heart Team  Pager: [phone] Phone: [phone]

## 2022-05-15 NOTE — Anesthesia Preprocedure Evaluation (Signed)
Anesthesia Evaluation  Patient identified by MRN, date of birth, ID band Patient awake    Reviewed: Allergy & Precautions, NPO status , Patient's Chart, lab work & pertinent test results  Airway Mallampati: II  TM Distance: >3 FB Neck ROM: Full    Dental  (+) Dental Advisory Given   Pulmonary former smoker   breath sounds clear to auscultation       Cardiovascular hypertension, Pt. on medications and Pt. on home beta blockers + Peripheral Vascular Disease  + dysrhythmias Atrial Fibrillation  Rhythm:Regular Rate:Normal     Neuro/Psych negative neurological ROS     GI/Hepatic negative GI ROS, Neg liver ROS,,,  Endo/Other  negative endocrine ROS    Renal/GU negative Renal ROS     Musculoskeletal  (+) Arthritis ,    Abdominal   Peds  Hematology negative hematology ROS (+)   Anesthesia Other Findings   Reproductive/Obstetrics                             Anesthesia Physical Anesthesia Plan  ASA: 2  Anesthesia Plan: General   Post-op Pain Management: Tylenol PO (pre-op)*   Induction: Intravenous  PONV Risk Score and Plan: 3 and Dexamethasone, Ondansetron and Treatment may vary due to age or medical condition  Airway Management Planned: Oral ETT  Additional Equipment: ClearSight  Intra-op Plan:   Post-operative Plan: Extubation in OR  Informed Consent: I have reviewed the patients History and Physical, chart, labs and discussed the procedure including the risks, benefits and alternatives for the proposed anesthesia with the patient or authorized representative who has indicated his/her understanding and acceptance.     Dental advisory given  Plan Discussed with: CRNA  Anesthesia Plan Comments: (2IV's. Clearsight. GETA)       Anesthesia Quick Evaluation

## 2022-05-15 NOTE — H&P (Signed)
Electrophysiology Office Follow up Visit Note:     Date:  05/15/2022    ID:  Carly Myers, DOB Sep 01, 1945, MRN [id number]   PCP:  Leamon Arnt, MD     [facility] Cardiologist:  Dorris Carnes, MD  [facility] Electrophysiologist:  Vickie Epley, MD      Interval History:     Carly Myers is a 77 y.o. female who presents for a follow up visit. They were last seen in clinic 08/08/2021 for pAF. She was initially referred to me to discuss LAAO given gait instability and risks of falling/bleeding. At the last appointment I recommended a Zio monitor to better assess her AF burden prior to finalizing the plan.    Today, we reviewed the results of her Zio monitor. She has been concerned about the finding of ventricular tachycardia, which we discussed. She confirms that during her recorded event while wearing the monitor, she had been feeling rapid heart palpitations; this corresponded with atrial fibrillation. Usually this seems to occur while she is asleep. She wonders if she has sleep apnea.   Yesterday while active she became tremulous. At the time she noticed her heart rate was 114 bpm, and her blood pressure was notable for being 36 diastolic. She is not certain if this was an accurate reading.   They deny any chest pain, shortness of breath, or peripheral edema. No lightheadedness, headaches, syncope, orthopnea, or PND.    Presents for LAAO today.   Objective      Past Medical History:  Diagnosis Date   Allergic rhinitis     Anaphylactic reaction to bee sting     Anxiety state      unspecified   Arthritis      Ortho Dr. Berenice Primas   Disorder of bone      unspecified   Essential hypertension 12/28/2019   Hypercholesteremia      migrated   Hyperlipemia     Long term (current) use of anticoagulants 12/28/2019    PAF   Low back pain     Major depression      in complete remission   Osteopenia     Osteoporosis     PAF (paroxysmal atrial fibrillation) ([place]) 12/28/2019    Post-menopausal     Senile purpura ([place])     Sensorineural hearing loss (SNHL) of both ears 05/08/2021    Dr. Benjamine Mola 2022   Shingles     Sinusitis     UTI (urinary tract infection)     Vitamin D deficiency             Past Surgical History:  Procedure Laterality Date   NO PAST SURGERIES          Current Medications: Active Medications      Current Meds  Medication Sig   Calcium Carb-Cholecalciferol (CALCIUM + D3) 600-200 MG-UNIT TABS Take 1 tablet by mouth daily.   Cholecalciferol (VITAMIN D) 2000 units CAPS Take 2,000 Units by mouth daily.   Cyanocobalamin (B-12 PO) Take by mouth.   diazepam (VALIUM) 5 MG tablet Take 0.5-1 tablets (2.5-5 mg total) by mouth 2 (two) times daily as needed (vertigo).   EPINEPHrine 0.3 mg/0.3 mL IJ SOAJ injection Inject 0.3 mg into the muscle once.   Magnesium Hydroxide (MAGNESIA PO) Take 1 tablet by mouth daily.   metoprolol succinate (TOPROL-XL) 25 MG 24 hr tablet Take 1 tablet (25 mg total) by mouth in the morning and at bedtime.   rivaroxaban (XARELTO) 20 MG TABS  tablet TAKE 1 TABLET DAILY WITH SUPPER   rosuvastatin (CRESTOR) 40 MG tablet TAKE 1 TABLET DAILY   Sod Picosulfate-Mag Ox-Cit Acd (CLENPIQ) 10-3.5-12 MG-GM -GM/160ML SOLN Per colonoscopy prep instructions per GI Instructions        Allergies:   Bee venom    Social History         Socioeconomic History   Marital status: Widowed      Spouse name: Not on file   Number of children: 0   Years of education: Not on file   Highest education level: Not on file  Occupational History   Not on file  Tobacco Use   Smoking status: Former      Types: Cigarettes      Quit date: 02/25/2008      Years since quitting: 13.6   Smokeless tobacco: Never  Vaping Use   Vaping Use: Never used  Substance and Sexual Activity   Alcohol use: Yes      Comment: wine-1 drink nightly w/ dinner   Drug use: Never   Sexual activity: Not Currently      Birth control/protection: Post-menopausal  Other  Topics Concern   Not on file  Social History Narrative    Right handed    Caffeine use: rare    Social Determinants of Health    Financial Resource Strain: Not on file  Food Insecurity: Not on file  Transportation Needs: Not on file  Physical Activity: Not on file  Stress: Not on file  Social Connections: Not on file      Family History: The patient's family history is negative for Colon cancer, Esophageal cancer, Rectal cancer, and Stomach cancer.   ROS:   Please see the history of present illness.    (+) Palpitations (+) Tremors All other systems reviewed and are negative.   EKGs/Labs/Other Studies Reviewed:     The following studies were reviewed today:   09/04/2021 Zio monitor HR 41 - 176 bpm, average 69 bpm. 1 nonsustained VT lasting 14 beats with an average rate 105 bpm. 9% AF burden with ventricular rates 61 - 176 bpm. Occasional supraventricular 4.8% Rare ventricular ectopy.  Symptom triggered episodes correspond to AF.     EKG:  EKG is personally reviewed.  09/30/2021:  EKG was not ordered.     Recent Labs: 05/09/2021: ALT 15; BUN 16; Creatinine, Ser 0.89; Hemoglobin 13.4; Magnesium 2.0; Platelets 299.0; Potassium 4.6; Sodium 138; TSH 1.18    Recent Lipid Panel Labs (Brief)          Component Value Date/Time    CHOL 158 05/09/2021 0921    CHOL 143 03/28/2019 1407    TRIG 83.0 05/09/2021 0921    HDL 62.40 05/09/2021 0921    HDL 63 03/28/2019 1407    CHOLHDL 3 05/09/2021 0921    VLDL 16.6 05/09/2021 0921    LDLCALC 79 05/09/2021 0921    LDLCALC 66 03/28/2019 1407        Physical Exam:     VS:  BP 167/75   Pulse 67   Ht 5\' 6"  (1.676 m)   Wt 127 lb 12.8 oz (58 kg)   SpO2 97%   BMI 20.63 kg/m         Wt Readings from Last 3 Encounters:  09/30/21 127 lb 12.8 oz (58 kg)  08/08/21 124 lb (56.2 kg)  07/25/21 127 lb (57.6 kg)      GEN: Well nourished, well developed in no acute distress HEENT: Normal NECK: No  JVD; No carotid  bruits LYMPHATICS: No lymphadenopathy CARDIAC: RRR, no murmurs, rubs, gallops RESPIRATORY:  Clear to auscultation without rales, wheezing or rhonchi  ABDOMEN: Soft, non-tender, non-distended MUSCULOSKELETAL:  No edema; No deformity  SKIN: Warm and dry NEUROLOGIC:  Alert and oriented x 3 PSYCHIATRIC:  Normal affect            Assessment ASSESSMENT:     1. PAF (paroxysmal atrial fibrillation) ([place])   2. Essential hypertension   3. Gait abnormality     PLAN:     In order of problems listed above:   #pAF Symptomatic. 9% AF burden on recent monitor with fairly high burden of supraventricular ectopy as well.    ----------     I have seen Carly Myers in the office today who is being considered for a Watchman left atrial appendage closure device. I believe they will benefit from this procedure given their history of atrial fibrillation, CHA2DS2-VASc score of 7. Unfortunately, the patient is not felt to be a long term anticoagulation candidate secondary to gait instability. The patient's chart has been reviewed and I feel that they would be a candidate for short term oral anticoagulation after Watchman implant.    It is my belief that after undergoing a LAA closure procedure, Dandre Baringer will not need long term anticoagulation which eliminates anticoagulation side effects and major bleeding risk.    Procedural risks for the Watchman implant have been reviewed with the patient including a 0.5% risk of stroke, <1% risk of perforation and <1% risk of device embolization. Other risks include bleeding, vascular damage, tamponade, worsening renal function, and death. The patient understands these risks.     The published clinical data on the safety and effectiveness of WATCHMAN include but are not limited to the following: - Holmes DR, Mechele Claude, Sick P et al. for the PROTECT AF Investigators. Percutaneous closure of the left atrial appendage versus warfarin therapy for prevention of stroke in  patients with atrial fibrillation: a randomised non-inferiority trial. Lancet 2009; 374: 534-42. Mechele Claude, Doshi SK, Abelardo Diesel D et al. on behalf of the PROTECT AF Investigators. Percutaneous Left Atrial Appendage Closure for Stroke Prophylaxis in Patients With Atrial Fibrillation 2.3-Year Follow-up of the PROTECT AF (Watchman Left Atrial Appendage System for Embolic Protection in Patients With Atrial Fibrillation) Trial. Circulation 2013; 127:720-729. - Alli O, Doshi S,  Kar S, Reddy VY, Sievert H et al. Quality of Life Assessment in the Randomized PROTECT AF (Percutaneous Closure of the Left Atrial Appendage Versus Warfarin Therapy for Prevention of Stroke in Patients With Atrial Fibrillation) Trial of Patients at Risk for Stroke With Nonvalvular Atrial Fibrillation. J Am Coll Cardiol 2013; P4788364. Vertell Limber DR, Tarri Abernethy, Price M, [place], Sievert H, Doshi S, Huber K, Reddy V. Prospective randomized evaluation of the Watchman left atrial appendage Device in patients with atrial fibrillation versus long-term warfarin therapy; the PREVAIL trial. Journal of the [employer] of Cardiology, Vol. 4, No. 1, 2014, 1-11. - Kar S, Doshi SK, Sadhu A, Horton R, Osorio J et al. Primary outcome evaluation of a next-generation left atrial appendage closure device: results from the PINNACLE FLX trial. Circulation 2021;143(18)1754-1762.    HAS-BLED score 2 Hypertension - n Abnormal renal and liver function (Dialysis, transplant, Cr >2.26 mg/dL /Cirrhosis or Bilirubin >2x Normal or AST/ALT/AP >3x Normal) No  Stroke Yes  Bleeding No  Labile INR (Unstable/high INR) No  Elderly (>65) y Drugs or alcohol (? 8 drinks/week, anti-plt or NSAID)  n   CHA2DS2-VASc Score = 7  The patient's score is based upon: CHF History: 0 HTN History: 1 Diabetes History: 0 Stroke History: 2 Vascular Disease History: 1 Age Score: 2 Gender Score: 1    Presents for LAAO today. Procedure reviewed.    Signed, Lars Mage, MD, [facility], [facility] 05/15/2022 Electrophysiology [facility]

## 2022-05-15 NOTE — Progress Notes (Signed)
Ambulated along the hallway with NT, tolerated well. Continue to monitor.

## 2022-05-15 NOTE — Discharge Summary (Signed)
[facility]    Patient ID: Carly Myers,  MRN: [id number], DOB/AGE: 28-Dec-1945 77 y.o.  Admit date: 05/15/2022 Discharge date: 05/15/2022  Primary Care Physician: Leamon Arnt, MD  Primary Cardiologist: Dorris Carnes, MD  Electrophysiologist: Vickie Epley, MD  Primary Discharge Diagnosis:  Paroxysmal Atrial Fibrillation Poor candidacy for long term anticoagulation due to hx of arthritis and unable to treat with NSAIDs  Secondary Discharge Diagnosis:  -HTN -vertigo -senile purpura -HLD -CAD -depression -arthritis unable to be adequately treated due to a/c use  Procedures This Admission:  Transeptal Puncture Intra-procedural TEE which showed no LAA thrombus Left atrial appendage occlusive device placement on 05/15/22 by Dr. Quentin Ore.   This study demonstrated:    CONCLUSIONS:  1.Successful implantation of a WATCHMAN left atrial appendage occlusive device    2. TEE demonstrating no LAA thrombus 3. No early apparent complications.    Post Implant Anticoagulation Strategy: Continue Xarelto for 45 days after implant. After 45 days, stop Xarelto and transition to Plavix 75mg  PO daily to complete 6 months of post implant therapy. Plan for CT scan after 60 days to assess Watchman position.   Brief HPI: Carly Myers is a 77 y.o. female with a history of paroxysmal atrial fibrillation, CAD, hypertension, hyperlipidemia, arthritis, and major depression who underwent AF ablation 01/23/22 with Dr. Quentin Ore. She has progressive arthritis and was interested in pursuing LAAO closure to allow better treatment for her arthritis. Pre ablation imaging showed anatomy suitable for LAAO closure.   Hospital Course:  The patient was admitted and underwent left atrial appendage occlusive device placement with Watchman FLX 61mm device. She was monitored post procedure and has been doing well. Given this, she is being considered for same day discharge. Groin site has been without  complication. Wound care and restrictions were reviewed with the patient. The patient has been scheduled for post procedure follow up with Kathyrn Drown, NP in approximately 1 month. Continue Xarelto for 45 days after implant. After 45 days (5/5), stop Xarelto and transition to Plavix 75mg  PO daily to complete 6 months of post implant therapy. Plan for CT scan after 60 days to assess Watchman position.  Physical Exam: Vitals:   05/15/22 1045 05/15/22 1100 05/15/22 1130 05/15/22 1200  BP: (!) 149/95 131/71 (!) 143/74 131/65  Pulse: 66 68 73 72  Resp: 15 10 16 14   Temp:      TempSrc:      SpO2: 95% 96% 96% 96%  Weight:      Height:       General: Well developed, well nourished, NAD Skin: Warm, dry, intact  Lungs:Clear to ausculation bilaterally. No wheezes, rales, or rhonchi. Breathing is unlabored. Cardiovascular: RRR with S1 S2. No murmurs Extremities: No edema. Groin site stable with no hematoma or bleeding.  Neuro: Alert and oriented. No focal deficits. No facial asymmetry. MAE spontaneously. Psych: Responds to questions appropriately with normal affect.    Labs:   Lab Results  Component Value Date   WBC 8.2 04/21/2022   HGB 13.6 04/21/2022   HCT 41.4 04/21/2022   MCV 99 (H) 04/21/2022   PLT 336 04/21/2022   No results for input(s): "NA", "K", "CL", "CO2", "BUN", "CREATININE", "CALCIUM", "PROT", "BILITOT", "ALKPHOS", "ALT", "AST", "GLUCOSE" in the last 168 hours.  Invalid input(s): "LABALBU"   Discharge Medications:  Allergies as of 05/15/2022       Reactions   Bee Venom Swelling        Medication List  STOP taking these medications    azithromycin 250 MG tablet Commonly known as: ZITHROMAX       TAKE these medications    acetaminophen 500 MG tablet Commonly known as: TYLENOL Take 500 mg by mouth every 6 (six) hours as needed for moderate pain.   COLLAGEN PO Take 1 tablet by mouth daily. With biotin and vitamin c   diazepam 5 MG tablet Commonly  known as: VALIUM Take 0.5-1 tablets (2.5-5 mg total) by mouth 2 (two) times daily as needed (vertigo). What changed: how much to take   docusate sodium 100 MG capsule Commonly known as: COLACE Take 100 mg by mouth daily as needed for mild constipation.   EPINEPHrine 0.3 mg/0.3 mL Soaj injection Commonly known as: EPI-PEN Inject 0.3 mg into the muscle as needed for anaphylaxis.   fluticasone 50 MCG/ACT nasal spray Commonly known as: FLONASE Place 1 spray into both nostrils daily as needed for allergies or rhinitis.   Krill Oil 350 MG Caps Take 350 mg by mouth daily.   LORazepam 0.5 MG tablet Commonly known as: ATIVAN Take 1 tablet (0.5 mg total) by mouth at bedtime as needed for sleep.   metoprolol succinate 25 MG 24 hr tablet Commonly known as: TOPROL-XL Take 12.5 mg in the morning and 25 mg in the evening What changed:  how much to take how to take this when to take this additional instructions   Mucinex Fast-Max Cong Headache 10-5-325 MG Caps Generic drug: DM-Phenylephrine-Acetaminophen Take 1 tablet by mouth daily as needed (congestion).   OVER THE COUNTER MEDICATION Apply 1 Application topically at bedtime. Hemp cream   pantoprazole 40 MG tablet Commonly known as: PROTONIX Take 1 tablet (40 mg total) by mouth daily.   phenazopyridine 100 MG tablet Commonly known as: Pyridium Take 1 tablet (100 mg total) by mouth 3 (three) times daily as needed for pain.   RESTFUL LEGS PM SL Place 1 tablet under the tongue at bedtime.   rosuvastatin 40 MG tablet Commonly known as: CRESTOR Take 20 mg Sun, Tues, Thurs, and Sat Take 40 mg Mon, Wed, Fri   VITAMIN D PO Take 1 capsule by mouth daily.   Xarelto 20 MG Tabs tablet Generic drug: rivaroxaban TAKE 1 TABLET DAILY WITH SUPPER (CONTINUE CURRENT DOSE XARELTO 20 MG PER DOCTOR ROSS) Notes to patient: Restart Xarelto tonight, 05/15/22        Disposition:  Home  Discharge Instructions     Call MD for:  difficulty  breathing, headache or visual disturbances   Complete by: As directed    Call MD for:  extreme fatigue   Complete by: As directed    Call MD for:  hives   Complete by: As directed    Call MD for:  persistant dizziness or light-headedness   Complete by: As directed    Call MD for:  persistant nausea and vomiting   Complete by: As directed    Call MD for:  redness, tenderness, or signs of infection (pain, swelling, redness, odor or green/yellow discharge around incision site)   Complete by: As directed    Call MD for:  severe uncontrolled pain   Complete by: As directed    Call MD for:  temperature >100.4   Complete by: As directed    Diet - low sodium heart healthy   Complete by: As directed    Discharge instructions   Complete by: As directed    [facility] Procedure, Care After  Procedure MD: Dr. Benson Norway  Clinical Coordinator: Lenice Llamas, RN  This sheet gives you information about how to care for yourself after your procedure. Your health care provider may also give you more specific instructions. If you have problems or questions, contact your health care provider.  What can I expect after the procedure? After the procedure, it is common to have: Bruising around your puncture site. Tenderness around your puncture site. Tiredness (fatigue).  Medication instructions It is very important to continue to take your blood thinner as directed by your doctor after the Watchman procedure. Call your procedure doctor's office with question or concerns. If you are on Coumadin (warfarin), you will have your INR checked the week after your procedure, with a goal INR of 2.0 - 3.0. Please follow your medication instructions on your discharge summary. Only take the medications listed on your discharge paperwork.  Follow up You will be seen in 1 month after your procedure You will have a repeat CT scan approximately 8 weeks after your procedure mark to check your device You will follow  up the MD/APP who performed your procedure 6 months after your procedure The Watchman Clinical Coordinator will check in with you from time to time, including 1 and 2 years after your procedure.    Follow these instructions at home: Puncture site care  Follow instructions from your health care provider about how to take care of your puncture site. Make sure you: If present, leave stitches (sutures), skin glue, or adhesive strips in place.  If a large square bandage is present, this may be removed 24 hours after surgery.  Check your puncture site every day for signs of infection. Check for: Redness, swelling, or pain. Fluid or blood. If your puncture site starts to bleed, lie down on your back, apply firm pressure to the area, and contact your health care provider. Warmth. Pus or a bad smell. Driving Do not drive yourself home if you received sedation Do not drive for at least 4 days after your procedure or however long your health care provider recommends. (Do not resume driving if you have previously been instructed not to drive for other health reasons.) Do not spend greater than 1 hour at a time in a car for the first 3 days. Stop and take a break with a 5 minute walk at least every hour.  Do not drive or use heavy machinery while taking prescription pain medicine.  Activity Avoid activities that take a lot of effort, including exercise, for at least 7 days after your procedure. For the first 3 days, avoid sitting for longer than one hour at a time.  Avoid alcoholic beverages, signing paperwork, or participating in legal proceedings for 24 hours after receiving sedation Do not lift anything that is heavier than 10 lb (4.5 kg) for one week.  No sexual activity for 1 week.  Return to your normal activities as told by your health care provider. Ask your health care provider what activities are safe for you. General instructions Take over-the-counter and prescription medicines only as told  by your health care provider. Do not use any products that contain nicotine or tobacco, such as cigarettes and e-cigarettes. If you need help quitting, ask your health care provider. You may shower after 24 hours, but Do not take baths, swim, or use a hot tub for 1 week.  Do not drink alcohol for 24 hours after your procedure. Keep all follow-up visits as told by your health care provider. This is important. Dental Work: [facility]  will require antibiotics prior to any dental work, including cleanings, for 6 months after your Watchman implantation to help protect you from infection. After 6 months, antibiotics are no longer required. Contact a health care provider if: You have redness, mild swelling, or pain around your puncture site. You have soreness in your throat or at your puncture site that does not improve after several days You have fluid or blood coming from your puncture site that stops after applying firm pressure to the area. Your puncture site feels warm to the touch. You have pus or a bad smell coming from your puncture site. You have a fever. You have chest pain or discomfort that spreads to your neck, jaw, or arm. You are sweating a lot. You feel nauseous. You have a fast or irregular heartbeat. You have shortness of breath. You are dizzy or light-headed and feel the need to lie down. You have pain or numbness in the arm or leg closest to your puncture site. Get help right away if: Your puncture site suddenly swells. Your puncture site is bleeding and the bleeding does not stop after applying firm pressure to the area. These symptoms may represent a serious problem that is an emergency. Do not wait to see if the symptoms will go away. Get medical help right away. Call your local emergency services (911 in the [place] Do not drive yourself to the hospital. Summary After the procedure, it is normal to have bruising and tenderness at the puncture site in your groin, neck, or  forearm. Check your puncture site every day for signs of infection. Get help right away if your puncture site is bleeding and the bleeding does not stop after applying firm pressure to the area. This is a medical emergency.  This information is not intended to replace advice given to you by your health care provider. Make sure you discuss any questions you have with your health care provider.   Increase activity slowly   Complete by: As directed        Follow-up Information     [name], MD Follow up on 06/16/2022.   Specialty: Cardiology Why: @ 9:20am. Please arrive at [place] information: [place] [address] [phone]                 Duration of Discharge Encounter: Greater than 30 minutes including physician time.  Signed, Kathyrn Drown, NP  05/15/2022 2:52 PM

## 2022-05-15 NOTE — Transfer of Care (Signed)
Immediate Anesthesia Transfer of Care Note  Patient: Carly Myers  Procedure(s) Performed: LEFT ATRIAL APPENDAGE OCCLUSION TRANSESOPHAGEAL ECHOCARDIOGRAM (TEE)  Patient Location: Cath Lab  Anesthesia Type:General  Level of Consciousness: awake, alert , and oriented  Airway & Oxygen Therapy: Patient Spontanous Breathing and Patient connected to nasal cannula oxygen  Post-op Assessment: Report given to RN and Post -op Vital signs reviewed and stable  Post vital signs: Reviewed and stable  Last Vitals:  Vitals Value Taken Time  BP 136/70 05/15/22 0903  Temp    Pulse 67 05/15/22 0910  Resp 17 05/15/22 0910  SpO2 97 % 05/15/22 0910  Vitals shown include unvalidated device data.  Last Pain:  Vitals:   05/15/22 0625  TempSrc:   PainSc: 0-No pain      Patients Stated Pain Goal: 0 ([id number])  Complications: There were no known notable events for this encounter.

## 2022-05-15 NOTE — TOC Transition Note (Signed)
Transition of Care [facility]) - CM/SW Discharge Note   Patient Details  Name: Carly Myers MRN: [id number] Date of Birth: 1945/11/11  [facility] CM/SW Contact:  Pollie Friar, RN Phone Number: 05/15/2022, 3:35 PM   Clinical Narrative:    Pt is discharging home with self care. No needs per TOC.   Final next level of care: Home/Self Care Barriers to Discharge: No Barriers Identified   Patient Goals and CMS Choice      Discharge Placement                         Discharge Plan and Services Additional resources added to the After Visit Summary for                                       Social Determinants of Health (SDOH) Interventions SDOH Screenings   Food Insecurity: No Food Insecurity (05/15/2022)  Housing: Low Risk  (05/15/2022)  Transportation Needs: No Transportation Needs (05/15/2022)  Utilities: Not At Risk (05/15/2022)  Depression (PHQ2-9): Low Risk  (03/07/2022)  Tobacco Use: Medium Risk (05/15/2022)     Readmission Risk Interventions     No data to display

## 2022-05-15 NOTE — Anesthesia Postprocedure Evaluation (Signed)
Anesthesia Post Note  Patient: Carly Myers  Procedure(s) Performed: LEFT ATRIAL APPENDAGE OCCLUSION TRANSESOPHAGEAL ECHOCARDIOGRAM (TEE)     Patient location during evaluation: PACU Anesthesia Type: General Level of consciousness: awake and alert Pain management: pain level controlled Vital Signs Assessment: post-procedure vital signs reviewed and stable Respiratory status: spontaneous breathing, nonlabored ventilation, respiratory function stable and patient connected to nasal cannula oxygen Cardiovascular status: blood pressure returned to baseline and stable Postop Assessment: no apparent nausea or vomiting Anesthetic complications: no  There were no known notable events for this encounter.  Last Vitals:  Vitals:   05/15/22 1130 05/15/22 1200  BP: (!) 143/74 131/65  Pulse: 73 72  Resp: 16 14  Temp:    SpO2: 96% 96%    Last Pain:  Vitals:   05/15/22 1200  TempSrc:   PainSc: 0-No pain                 Tiajuana Amass

## 2022-05-15 NOTE — Anesthesia Procedure Notes (Signed)
Procedure Name: Intubation Date/Time: 05/15/2022 7:48 AM  Performed by: Anastasio Auerbach, CRNAPre-anesthesia Checklist: Patient identified, Emergency Drugs available, Suction available and Patient being monitored Patient Re-evaluated:Patient Re-evaluated prior to induction Oxygen Delivery Method: Circle system utilized Preoxygenation: Pre-oxygenation with 100% oxygen Induction Type: IV induction Ventilation: Mask ventilation without difficulty Laryngoscope Size: Mac and 3 Grade View: Grade I Tube type: Oral Number of attempts: 1 Airway Equipment and Method: Stylet and Oral airway Placement Confirmation: ETT inserted through vocal cords under direct vision, positive ETCO2 and breath sounds checked- equal and bilateral Secured at: 21 cm Tube secured with: Tape Dental Injury: Teeth and Oropharynx as per pre-operative assessment

## 2022-05-16 ENCOUNTER — Telehealth: Payer: Self-pay

## 2022-05-16 MED FILL — Fentanyl Citrate Preservative Free (PF) Inj 100 MCG/2ML: INTRAMUSCULAR | Qty: 2 | Status: AC

## 2022-05-16 NOTE — Telephone Encounter (Signed)
  [facility] Team  Contacted the patient regarding discharge from [facility] on 05/16/2022  The patient understands to follow up with Dr. Dorris Carnes on 06/16/2022 in preparation for imaging on 07/08/2022.  The patient understands discharge instructions? Yes  The patient understands medications and regimen? Yes   The patient understands to call with any questions or concerns prior to scheduled visit.

## 2022-05-16 NOTE — Transitions of Care (Post Inpatient/ED Visit) (Signed)
   05/16/2022  Name: Carly Myers MRN: [id number] DOB: 26-Oct-1945  Today's TOC FU Call Status: Today's TOC FU Call Status:: Successful TOC FU Call Competed TOC FU Call Complete Date: 05/16/22  Transition Care Management Follow-up Telephone Call Date of Discharge: 05/15/22 Discharge Facility: [name]) Type of Discharge: Inpatient Admission Primary Inpatient Discharge Diagnosis:: "A-fib" How have you been since you were released from the hospital?: Better (Patient states she is doing well-rested well last night.She has had no pain or discomfort to surgical site. Drsg clean,dry and itnact-will remove later today per post-op instuctions.) Any questions or concerns?: No  Items Reviewed: Did you receive and understand the discharge instructions provided?: Yes Medications obtained and verified?: Yes (Medications Reviewed) Any new allergies since your discharge?: No Dietary orders reviewed?: Yes Type of Diet Ordered:: low salt/heart healthy Do you have support at home?: Yes People in Home: alone Name of Support/Comfort Primary Source: Barrington Ellison helps out as needed  [place] and Equipment/Supplies: [place] Ordered?: NA Any new equipment or medical supplies ordered?: NA  Functional Questionnaire: Do you need assistance with bathing/showering or dressing?: No Do you need assistance with meal preparation?: No Do you need assistance with eating?: No Do you have difficulty maintaining continence: No Do you need assistance with getting out of bed/getting out of a chair/moving?: No Do you have difficulty managing or taking your medications?: No  Follow up appointments reviewed: PCP Follow-up appointment confirmed?: [facility] Follow-up appointment confirmed?: Yes Date of Specialist follow-up appointment?: 06/16/22 Follow-Up Specialty Provider:: Dr. Harrington Challenger Do you need transportation to your follow-up appointment?: No Do you understand care options if  your condition(s) worsen?: Yes-patient verbalized understanding  SDOH Interventions Today    Flowsheet Row Most Recent Value  SDOH Interventions   Food Insecurity Interventions Intervention Not Indicated  Transportation Interventions Intervention Not Indicated      TOC Interventions Today    Flowsheet Row Most Recent Value  TOC Interventions   TOC Interventions Discussed/Reviewed TOC Interventions Discussed, Post op wound/incision care, Post discharge activity limitations per provider, S/S of infection      Interventions Today    Flowsheet Row Most Recent Value  General Interventions   General Interventions Discussed/Reviewed General Interventions Discussed, Doctor Visits  Doctor Visits Discussed/Reviewed Doctor Visits Discussed, Specialist  Education Interventions   Education Provided Provided Education  Provided Verbal Education On Nutrition, Medication, When to see the doctor, Other  [sx mgmt-bowel regimen]  Nutrition Interventions   Nutrition Discussed/Reviewed Nutrition Discussed, Adding fruits and vegetables, Decreasing salt  Pharmacy Interventions   Pharmacy Dicussed/Reviewed Pharmacy Topics Discussed, Medications and their functions  Safety Interventions   Safety Discussed/Reviewed Safety Discussed       Hetty Blend [facility] Care Management Care Management Community Coordinator Direct Phone: [phone] Toll Free: [phone] Fax: [phone]

## 2022-05-20 ENCOUNTER — Ambulatory Visit: Payer: Self-pay | Admitting: *Deleted

## 2022-05-20 NOTE — Chronic Care Management (AMB) (Signed)
   05/20/2022  Carly Myers 05-16-1945 [id number]   Enrollment status changed to previously enrolled.  Jacqlyn Larsen [facility], BSN RN Case Manager [phone]

## 2022-05-21 ENCOUNTER — Encounter: Payer: Medicare Other | Admitting: Family Medicine

## 2022-05-22 DIAGNOSIS — M1712 Unilateral primary osteoarthritis, left knee: Secondary | ICD-10-CM | POA: Diagnosis not present

## 2022-05-30 ENCOUNTER — Encounter: Payer: Medicare Other | Admitting: Family Medicine

## 2022-06-02 ENCOUNTER — Encounter: Payer: Medicare Other | Admitting: Family Medicine

## 2022-06-03 DIAGNOSIS — M1712 Unilateral primary osteoarthritis, left knee: Secondary | ICD-10-CM | POA: Diagnosis not present

## 2022-06-14 NOTE — Progress Notes (Unsigned)
Cardiology Office Note   Date:  06/16/2022   ID:  Carly, Myers [birth year], MRN [id number]  PCP:  [name], MD  Cardiologist:   [name], MD   F/U of atrial fibrillation     History of Present Illness: Carly Myers is a 77 y.o. female with hx of CAD on CT  Chest CT showed Aortic atherosclerosis wiht calcification of LAD noted  The pt also had interimttent afib /flutter on Holter  Started on Toprol XL and Xarelto   Echo done which was normal   Pt also with signficant dizziness, unsteadiness due to vertigo     I saw the pt in April 2023   She has been seen by [name] and last by [name] Pt is s/p Watchman on 05/15/22 Pt says that she occasionally has an ache in her chest Very brief  Not assocatied with activity      Denies palpitations        Pt with a lot of questions   REcovering from ablation and Watchman  She denies palpitations   Breathing is OK Has vertigo  No CP   Current Meds  Medication Sig   acetaminophen (TYLENOL) 500 MG tablet Take 500 mg by mouth every 6 (six) hours as needed for moderate pain.   [START ON 06/29/2022] clopidogrel (PLAVIX) 75 MG tablet Take 1 tablet (75 mg total) by mouth daily.   COLLAGEN PO Take 1 tablet by mouth daily. With biotin and vitamin c   diazepam (VALIUM) 5 MG tablet Take 0.5-1 tablets (2.5-5 mg total) by mouth 2 (two) times daily as needed (vertigo). (Patient taking differently: Take 0.625 mg by mouth 2 (two) times daily as needed (vertigo).)   DM-Phenylephrine-Acetaminophen (MUCINEX FAST-MAX CONG HEADACHE) 10-5-325 MG CAPS Take 1 tablet by mouth daily as needed (congestion).   docusate sodium (COLACE) 100 MG capsule Take 100 mg by mouth daily as needed for mild constipation.   EPINEPHrine 0.3 mg/0.3 mL IJ SOAJ injection Inject 0.3 mg into the muscle as needed for anaphylaxis.   fluticasone (FLONASE) 50 MCG/ACT nasal spray Place 1 spray into both nostrils daily as needed for allergies or rhinitis.   Homeopathic Products  (RESTFUL LEGS PM SL) Place 1 tablet under the tongue at bedtime.   Krill Oil 350 MG CAPS Take 350 mg by mouth daily.   LORazepam (ATIVAN) 0.5 MG tablet Take 1 tablet (0.5 mg total) by mouth at bedtime as needed for sleep.   metoprolol succinate (TOPROL-XL) 25 MG 24 hr tablet Take 12.5 mg in the morning and 25 mg in the evening (Patient taking differently: Take 12.5 mg by mouth 2 (two) times daily.)   Multiple Vitamin (MULTIVITAMIN) tablet Take 1 tablet by mouth daily.   OVER THE COUNTER MEDICATION Apply 1 Application topically at bedtime. Hemp cream   rosuvastatin (CRESTOR) 40 MG tablet Take 20 mg Sun, Tues, Thurs, and Sat Take 40 mg Mon, Wed, Fri   VITAMIN D PO Take 1 capsule by mouth daily.   [DISCONTINUED] XARELTO 20 MG TABS tablet TAKE 1 TABLET DAILY WITH SUPPER (CONTINUE CURRENT DOSE XARELTO 20 MG PER DOCTOR [name])     Allergies:   Bee venom   Past Medical History:  Diagnosis Date   Allergic rhinitis    Anaphylactic reaction to bee sting    Anxiety state    unspecified   Arthritis    Ortho Dr. Luiz Blare   Disorder of bone    unspecified   Essential hypertension 12/28/2019  Hypercholesteremia    migrated   Hyperlipemia    Long term (current) use of anticoagulants 12/28/2019   PAF   Low back pain    Major depression    in complete remission   Osteopenia    Osteoporosis    PAF (paroxysmal atrial fibrillation) 12/28/2019   Post-menopausal    Presence of Watchman left atrial appendage closure device 05/15/2022   Watchman FLX 20mm placed by Dr. Lalla Brothers   Senile purpura    Sensorineural hearing loss (SNHL) of both ears 05/08/2021   Dr. [name] 2022   Shingles    Sinusitis    UTI (urinary tract infection)    Vitamin D deficiency     Past Surgical History:  Procedure Laterality Date   ATRIAL FIBRILLATION ABLATION N/A 01/23/2022   Procedure: ATRIAL FIBRILLATION ABLATION;  Surgeon: [name], MD;  Location: [facility] INVASIVE CV [facility];  Service: Cardiovascular;  Laterality:  N/A;   LEFT ATRIAL APPENDAGE OCCLUSION N/A 05/15/2022   Procedure: LEFT ATRIAL APPENDAGE OCCLUSION;  Surgeon: [name], MD;  Location: [facility]VASIVE CV [facility];  Service: Cardiovascular;  Laterality: N/A;   NO PAST SURGERIES     TEE WITHOUT CARDIOVERSION N/A 05/15/2022   Procedure: TRANSESOPHAGEAL ECHOCARDIOGRAM (TEE);  Surgeon: [name], MD;  Location: [facility] INVASIVE CV LAB;  Service: Cardiovascular;  Laterality: N/A;     Social History:  The patient  reports that she quit smoking about 14 years ago. Her smoking use included cigarettes. She has never used smokeless tobacco. She reports current alcohol use. She reports that she does not use drugs.   Family History:  The patient's family history is not on file.    ROS:  Please see the history of present illness. All other systems are reviewed and  Negative to the above problem except as noted.    PHYSICAL EXAM: VS:  BP 126/70   Pulse 70   Ht  (1.676 m)   Wt 123 lb 3.2 oz (55.9 kg)   SpO2 96%   BMI 19.89 kg/m    GEN: Thin 77 yo , in no acute distress  HEENT: normal  Neck: JVP is normal  No bruits Cardiac: RRR  No murmur   Respiratory:  clear to auscultation  GI: soft, nontender  No hepatomegaly    EKG:  EKG is not ordered today     Sept 2022  Carotid   Summary: Right Carotid: Velocities in the right ICA are consistent with a 1-39% stenosis. Left Carotid: Velocities in the left ICA are consistent with a 1-39% stenosis. Vertebrals: Bilateral vertebral arteries demonstrate antegrade flow. Subclavians: Normal flow hemodynamics were seen in bilateral subclavian arteries. *See table(s) above for measurements and observations.   Sept 2022  1. Left ventricular ejection fraction, by estimation, is 55 to 60%. The  left ventricle has normal function. The left ventricle has no regional  wall motion abnormalities. Left ventricular diastolic parameters are  consistent with Grade I diastolic  dysfunction (impaired  relaxation).   2. Right ventricular systolic function is normal. The right ventricular  size is normal. There is normal pulmonary artery systolic pressure. The  estimated right ventricular systolic pressure is 25.3 mmHg.   3. The mitral valve is normal in structure. Mild mitral valve  regurgitation. No evidence of mitral stenosis.   4. The aortic valve is grossly normal. Aortic valve regurgitation is not  visualized. No aortic stenosis is present.   5. The inferior vena cava is normal in size with greater  than 50%  respiratory variability, suggesting right atrial pressure of 3 mmHg.   Conclusion(s)/Recommendation(s): No intracardiac source of embolism  detected on this transthoracic study. A transesophageal echocardiogram is  recommended to exclude cardiac source of embolism if clinically indicated.   Lipid Panel    Component Value Date/Time   CHOL 158 05/09/2021 0921   CHOL 143 03/28/2019 1407   TRIG 83.0 05/09/2021 0921   HDL 62.40 05/09/2021 0921   HDL 63 03/28/2019 1407   CHOLHDL 3 05/09/2021 0921   VLDL 16.6 05/09/2021 0921   LDLCALC 79 05/09/2021 0921   LDLCALC 66 03/28/2019 1407      Wt Readings from Last 3 Encounters:  06/16/22 123 lb 3.2 oz (55.9 kg)  05/15/22 127 lb (57.6 kg)  04/21/22 126 lb 3.2 oz (57.2 kg)      ASSESSMENT AND PLAN:  1  PAF  Pt is s/p Watchman procedure   Will finishing anticoagulant and move to Plavix.       2 CAD Found on CT scan NO symptoms of angina  3  HL  will check lipids  4  HTN  BP is well controlled       5  Lung nodule  Continues to get periodic screenings  6  Vestibular  Continues to have problems with unsteadiness   Labs:  TSH, BMET, A1C, Vit D, lipids        Current medicines are reviewed at length with the patient today.  The patient does not have concerns regarding medicines.  Signed, [name], MD  06/16/2022 10:19 PM    [facility] [address] [place], [place], [place]  [address] Phone: [phone]; Fax: [phone]

## 2022-06-16 ENCOUNTER — Encounter: Payer: Self-pay | Admitting: Internal Medicine

## 2022-06-16 ENCOUNTER — Ambulatory Visit: Payer: Medicare Other | Attending: Internal Medicine | Admitting: Internal Medicine

## 2022-06-16 VITALS — BP 126/70 | HR 70 | Ht 66.0 in | Wt 123.2 lb

## 2022-06-16 DIAGNOSIS — Z131 Encounter for screening for diabetes mellitus: Secondary | ICD-10-CM | POA: Diagnosis not present

## 2022-06-16 DIAGNOSIS — I10 Essential (primary) hypertension: Secondary | ICD-10-CM

## 2022-06-16 DIAGNOSIS — D6869 Other thrombophilia: Secondary | ICD-10-CM | POA: Diagnosis not present

## 2022-06-16 DIAGNOSIS — Z1321 Encounter for screening for nutritional disorder: Secondary | ICD-10-CM | POA: Diagnosis not present

## 2022-06-16 DIAGNOSIS — E559 Vitamin D deficiency, unspecified: Secondary | ICD-10-CM | POA: Diagnosis not present

## 2022-06-16 DIAGNOSIS — R937 Abnormal findings on diagnostic imaging of other parts of musculoskeletal system: Secondary | ICD-10-CM | POA: Diagnosis not present

## 2022-06-16 DIAGNOSIS — I679 Cerebrovascular disease, unspecified: Secondary | ICD-10-CM

## 2022-06-16 DIAGNOSIS — I480 Paroxysmal atrial fibrillation: Secondary | ICD-10-CM | POA: Diagnosis not present

## 2022-06-16 DIAGNOSIS — Z95818 Presence of other cardiac implants and grafts: Secondary | ICD-10-CM | POA: Diagnosis not present

## 2022-06-16 DIAGNOSIS — Z01818 Encounter for other preprocedural examination: Secondary | ICD-10-CM

## 2022-06-16 DIAGNOSIS — R42 Dizziness and giddiness: Secondary | ICD-10-CM | POA: Diagnosis not present

## 2022-06-16 MED ORDER — CLOPIDOGREL BISULFATE 75 MG PO TABS: 75.0000 mg | ORAL_TABLET | Freq: Every day | ORAL | 3 refills | Status: DC

## 2022-06-16 NOTE — Patient Instructions (Signed)
Medication Instructions:  STOP XARELTO AND START PLAVIX DAILY ON Jun 29, 2022 *If you need a refill on your cardiac medications before your next appointment, please call your pharmacy*   Lab Work: BMET, TSH, LIPID, VIT D, HGBA1C If you have labs (blood work) drawn today and your tests are completely normal, you will receive your results only by: MyChart Message (if you have MyChart) OR A paper copy in the mail If you have any lab test that is abnormal or we need to change your treatment, we will call you to review the results.   Testing/Procedures:    Follow-Up: At [facility], you and your health needs are our priority.  As part of our continuing mission to provide you with exceptional heart care, we have created designated Provider Care Teams.  These Care Teams include your primary Cardiologist (physician) and Advanced Practice Providers (APPs -  Physician Assistants and Nurse Practitioners) who all work together to provide you with the care you need, when you need it.  We recommend signing up for the patient portal called "MyChart".  Sign up information is provided on this After Visit Summary.  MyChart is used to connect with patients for Virtual Visits (Telemedicine).  Patients are able to view lab/test results, encounter notes, upcoming appointments, etc.  Non-urgent messages can be sent to your provider as well.   To learn more about what you can do with MyChart, go to [email or web address].    Your next appointment:   6 month(s)  Provider:   [name], MD     Other Instructions

## 2022-06-17 ENCOUNTER — Other Ambulatory Visit: Payer: Self-pay

## 2022-06-17 LAB — TSH: TSH: 0.721 u[IU]/mL (ref 0.450–4.500)

## 2022-06-17 LAB — BASIC METABOLIC PANEL
BUN/Creatinine Ratio: 17 (ref 12–28)
BUN: 14 mg/dL (ref 8–27)
CO2: 22 mmol/L (ref 20–29)
Calcium: 9.6 mg/dL (ref 8.7–10.3)
Chloride: 102 mmol/L (ref 96–106)
Creatinine, Ser: 0.82 mg/dL (ref 0.57–1.00)
Glucose: 98 mg/dL (ref 70–99)
Potassium: 4.8 mmol/L (ref 3.5–5.2)
Sodium: 139 mmol/L (ref 134–144)
eGFR: 73 mL/min/{1.73_m2} (ref >59)

## 2022-06-17 LAB — LIPID PANEL
Chol/HDL Ratio: 2.4 ratio (ref 0.0–4.4)
Cholesterol, Total: 146 mg/dL (ref 100–199)
HDL: 61 mg/dL (ref >39)
LDL Chol Calc (NIH): 71 mg/dL (ref 0–99)
Triglycerides: 73 mg/dL (ref 0–149)
VLDL Cholesterol Cal: 14 mg/dL (ref 5–40)

## 2022-06-17 LAB — HEMOGLOBIN A1C
Est. average glucose Bld gHb Est-mCnc: 126 mg/dL
Hgb A1c MFr Bld: 6 % — ABNORMAL HIGH (ref 4.8–5.6)

## 2022-06-17 LAB — VITAMIN D 25 HYDROXY (VIT D DEFICIENCY, FRACTURES): Vit D, 25-Hydroxy: 131 ng/mL — ABNORMAL HIGH (ref 30.0–100.0)

## 2022-06-18 ENCOUNTER — Encounter: Payer: Self-pay | Admitting: Family Medicine

## 2022-06-18 ENCOUNTER — Ambulatory Visit (INDEPENDENT_AMBULATORY_CARE_PROVIDER_SITE_OTHER): Payer: Medicare Other | Admitting: Family Medicine

## 2022-06-18 VITALS — BP 132/68 | HR 63 | Temp 97.9°F | Ht 66.0 in | Wt 122.6 lb

## 2022-06-18 DIAGNOSIS — M8580 Other specified disorders of bone density and structure, unspecified site: Secondary | ICD-10-CM

## 2022-06-18 DIAGNOSIS — I10 Essential (primary) hypertension: Secondary | ICD-10-CM | POA: Diagnosis not present

## 2022-06-18 DIAGNOSIS — I480 Paroxysmal atrial fibrillation: Secondary | ICD-10-CM | POA: Diagnosis not present

## 2022-06-18 DIAGNOSIS — H903 Sensorineural hearing loss, bilateral: Secondary | ICD-10-CM | POA: Diagnosis not present

## 2022-06-18 DIAGNOSIS — H6123 Impacted cerumen, bilateral: Secondary | ICD-10-CM | POA: Diagnosis not present

## 2022-06-18 DIAGNOSIS — H81392 Other peripheral vertigo, left ear: Secondary | ICD-10-CM | POA: Diagnosis not present

## 2022-06-18 DIAGNOSIS — I679 Cerebrovascular disease, unspecified: Secondary | ICD-10-CM | POA: Diagnosis not present

## 2022-06-18 DIAGNOSIS — E782 Mixed hyperlipidemia: Secondary | ICD-10-CM

## 2022-06-18 DIAGNOSIS — R7303 Prediabetes: Secondary | ICD-10-CM | POA: Diagnosis not present

## 2022-06-18 MED ORDER — METOPROLOL SUCCINATE ER 25 MG PO TB24: 12.5000 mg | ORAL_TABLET | Freq: Two times a day (BID) | ORAL | Status: DC

## 2022-06-18 NOTE — Progress Notes (Signed)
Subjective  Chief Complaint  Patient presents with   Annual Exam    Pt here for Annual exam and is currently fasting     HPI: Carly Myers is a 77 y.o. female who presents to [facility] [place] today for a Female Wellness Visit. She also has the concerns and/or needs as listed above in the chief complaint. These will be addressed in addition to the Health Maintenance Visit.   Wellness Visit: annual visit with health maintenance review and exam without Pap  HM: all screens current. Declines shingrix.  Chronic disease f/u and/or acute problem visit: (deemed necessary to be done in addition to the wellness visit): HTN: Feeling well. Taking medications w/o adverse effects. No symptoms of CHF, angina; no palpitations, sob, cp or lower extremity edema. Compliant with meds.  PAF s/p Watchman implant for afib stroke prevention. Eligible due to high fall risk. Now off eloquis. Remains rate controlled on low dose bb. Reviewed cards f/u visit and labs. See below.  Prediabetes: overall, eats healthy.  HLD with LDL at goal on statin.  Vertigo: remains major problem. No falls. Rare valium use. At times uses a cane.  C/o decreased hearing beyond usual.   Assessment  1. Essential hypertension   2. Mixed hyperlipidemia   3. PAF (paroxysmal atrial fibrillation)   4. Peripheral vertigo involving left ear   5. Cerebral vascular disease   6. Osteopenia, unspecified location   7. Prediabetes   8. Sensorineural hearing loss (SNHL) of both ears   9. Bilateral impacted cerumen      Plan  Female Wellness Visit: Age appropriate Health Maintenance and Prevention measures were discussed with patient. Included topics are cancer screening recommendations, ways to keep healthy (see AVS) including dietary and exercise recommendations, regular eye and dental care, use of seat belts, and avoidance of moderate alcohol use and tobacco use.  BMI: discussed patient's BMI and encouraged positive  lifestyle modifications to help get to or maintain a target BMI. HM needs and immunizations were addressed and ordered. See below for orders. See HM and immunization section for updates. Educated and declines shingrix Routine labs and screening tests ordered including cmp, cbc and lipids where appropriate. Discussed recommendations regarding Vit D and calcium supplementation (see AVS)  Chronic disease management visit and/or acute problem visit: HTN and HLD are well controlled.  Vertigo: persistent. Medication and behavioral mgt Paroxysmal A-fib: Status post Watchman procedure.  Transitioning to Plavix.  Asymptomatic.  Low-dose beta-blocker. Osteopenia: Not due for next DEXA for the next year or 2. Cerumen impaction: Patient to start earwax softener and then will return for irrigation Discussed diet to prevent worsening of prediabetes  Follow up: 12 months for complete physical No orders of the defined types were placed in this encounter.  Meds ordered this encounter  Medications   metoprolol succinate (TOPROL-XL) 25 MG 24 hr tablet    Sig: Take 0.5 tablets (12.5 mg total) by mouth 2 (two) times daily.      Body mass index is 19.79 kg/m. Wt Readings from Last 3 Encounters:  06/18/22 122 lb 9.6 oz (55.6 kg)  06/16/22 123 lb 3.2 oz (55.9 kg)  05/15/22 127 lb (57.6 kg)     Patient Active Problem List   Diagnosis Date Noted   Prediabetes 06/18/2022    Priority: High    A1c 6.0 4/24    Vestibular vertigo 10/26/2020    Priority: High    Dr. Terrace Arabia and Dr. [name] [phone]  Cerebral vascular disease 10/26/2020    Priority: High    Brain MRI: small vessel ischemic changes with small remote infarct left cerebellum 2022    Essential hypertension 12/28/2019    Priority: High   PAF (paroxysmal atrial fibrillation) 12/28/2019    Priority: High   Mixed hyperlipidemia 03/25/2017    Priority: High   Osteopenia 03/25/2017    Priority: Medium     dexa 2022 [facility]: lowest T = -1.8,  recheck 3 years Failed fosamax in past    Positive colorectal cancer screening using Cologuard test with normal colonoscopy 6/23 11/21/2021    Priority: Low    No further screening indicated.    Sensorineural hearing loss (SNHL) of both ears 05/08/2021    Priority: Low    Dr. [name] 2022    Vitamin deficiency 03/25/2017    Priority: Low   Senile purpura 03/25/2017    Priority: Low   Allergic rhinitis 03/25/2017    Priority: Low   Presence of Watchman left atrial appendage closure device 05/15/2022    Watchman FLX 20mm placed by Dr. Lalla Brothers     Atrial fibrillation 05/15/2022   Health Maintenance  Topic Date Due   Medicare Annual Wellness (AWV)  04/10/2019   COVID-19 Vaccine (3 - 2023-24 season) 07/04/2022 (Originally 10/25/2021)   MAMMOGRAM  09/05/2022   INFLUENZA VACCINE  09/25/2022   DEXA SCAN  08/16/2023   COLONOSCOPY (Pts 45-64yrs Insurance coverage will need to be confirmed)  07/26/2031   Pneumonia Vaccine 14+ Years old  Completed   Hepatitis C Screening  Completed   HPV VACCINES  Aged Out   DTaP/Tdap/Td  Discontinued   Zoster Vaccines- Shingrix  Discontinued   Immunization History  Administered Date(s) Administered   PFIZER(Purple Top)SARS-COV-2 Vaccination 04/01/2019, 12/01/2019   PNEUMOCOCCAL CONJUGATE-20 05/08/2021   We updated and reviewed the patient's past history in detail and it is documented below. Allergies: Patient is allergic to bee venom. Past Medical History Patient  has a past medical history of Allergic rhinitis, Anaphylactic reaction to bee sting, Anxiety state, Arthritis, Disorder of bone, Essential hypertension (12/28/2019), Hypercholesteremia, Hyperlipemia, Long term (current) use of anticoagulants (12/28/2019), Low back pain, Major depression, Osteopenia, Osteoporosis, PAF (paroxysmal atrial fibrillation) (12/28/2019), Post-menopausal, Presence of Watchman left atrial appendage closure device (05/15/2022), Senile purpura, Sensorineural hearing loss  (SNHL) of both ears (05/08/2021), Shingles, Sinusitis, UTI (urinary tract infection), and Vitamin D deficiency. Past Surgical History Patient  has a past surgical history that includes No past surgeries; ATRIAL FIBRILLATION ABLATION (N/A, 01/23/2022); LEFT ATRIAL APPENDAGE OCCLUSION (N/A, 05/15/2022); and TEE without cardioversion (N/A, 05/15/2022). Family History: Patient family history is not on file. Social History:  Patient  reports that she quit smoking about 14 years ago. Her smoking use included cigarettes. She has never used smokeless tobacco. She reports current alcohol use. She reports that she does not use drugs.  Review of Systems: Constitutional: negative for fever or malaise Ophthalmic: negative for photophobia, double vision or loss of vision Cardiovascular: negative for chest pain, dyspnea on exertion, or new LE swelling Respiratory: negative for SOB or persistent cough Gastrointestinal: negative for abdominal pain, change in bowel habits or melena Genitourinary: negative for dysuria or gross hematuria, no abnormal uterine bleeding or disharge Musculoskeletal: negative for new gait disturbance or muscular weakness Integumentary: negative for new or persistent rashes, no breast lumps Neurological: negative for TIA or stroke symptoms Psychiatric: negative for SI or delusions Allergic/Immunologic: negative for hives  Patient Care Team    Relationship Specialty Notifications Start End  [name], MD PCP - General Family Medicine  12/26/19   Pricilla Riffle, MD PCP - Cardiology Cardiology Admissions 03/29/18   [name], MD PCP - Electrophysiology Cardiology  08/08/21   [place], Amy, MD Consulting Physician Dermatology  12/28/19   Erroll Luna, [facility] Pharmacist Pharmacist  02/05/21    Comment: [phone]    Objective  Vitals: BP 132/68   Pulse 63   Temp 97.9 F (36.6 C)   Ht 5\' 6"  (1.676 m)   Wt 122 lb 9.6 oz (55.6 kg)   SpO2 96%   BMI 19.79 kg/m   General:  Well developed, well nourished, no acute distress  Psych:  Alert and orientedx3,normal mood and affect HEENT:  Normocephalic, atraumatic, non-icteric sclera,  supple neck without adenopathy, mass or thyromegaly, bilateral cerumen impaction with small extradural auditory canals Cardiovascular:  Normal S1, S2, RRR without gallop, rub or murmur Respiratory:  Good breath sounds bilaterally, CTAB with normal respiratory effort Gastrointestinal: normal bowel sounds, soft, non-tender, no noted masses. No HSM MSK: extremities without edema, joints without erythema or swelling Neurologic:    Mental status is normal.  Gross motor and sensory exams are normal.  No tremor, unsteady gait  Commons side effects, risks, benefits, and alternatives for medications and treatment plan prescribed today were discussed, and the patient expressed understanding of the given instructions. Patient is instructed to call or message via MyChart if he/she has any questions or concerns regarding our treatment plan. No barriers to understanding were identified. We discussed Red Flag symptoms and signs in detail. Patient expressed understanding regarding what to do in case of urgent or emergency type symptoms.  Medication list was reconciled, printed and provided to the patient in AVS. Patient instructions and summary information was reviewed with the patient as documented in the AVS. This note was prepared with assistance of Dragon voice recognition software. Occasional wrong-word or sound-a-like substitutions may have occurred due to the inherent limitations of voice recognition software

## 2022-07-07 ENCOUNTER — Telehealth (HOSPITAL_COMMUNITY): Payer: Self-pay | Admitting: Emergency Medicine

## 2022-07-07 NOTE — Telephone Encounter (Signed)
Reaching out to patient to offer assistance regarding upcoming cardiac imaging study; pt verbalizes understanding of appt date/time, parking situation and where to check in, pre-test NPO status and medications ordered, and verified current allergies; name and call back number provided for further questions should they arise Aleasha Ellianah Cordy RN Navigator Cardiac Imaging  Heart and Vascular [phone] office [phone] cell 

## 2022-07-07 NOTE — Telephone Encounter (Signed)
Attempted to call patient regarding upcoming cardiac CT appointment. °Left message on voicemail with name and callback number °Lunetta Yeilyn Gent RN Navigator Cardiac Imaging ° Heart and Vascular Services °[phone] Office °[phone] Cell ° °

## 2022-07-08 ENCOUNTER — Ambulatory Visit (HOSPITAL_COMMUNITY)
Admission: RE | Admit: 2022-07-08 | Discharge: 2022-07-08 | Disposition: A | Discharge status: Home / Self Care | Payer: Medicare Other | Source: Ambulatory Visit | Attending: Cardiology | Admitting: Cardiology

## 2022-07-08 DIAGNOSIS — I480 Paroxysmal atrial fibrillation: Secondary | ICD-10-CM | POA: Insufficient documentation

## 2022-07-08 DIAGNOSIS — Z95818 Presence of other cardiac implants and grafts: Secondary | ICD-10-CM

## 2022-07-08 MED ORDER — IOHEXOL 350 MG/ML SOLN
95.0000 mL | Freq: Once | INTRAVENOUS | Status: AC | PRN
  Administered 2022-07-08: 95 mL via INTRAVENOUS

## 2022-07-11 ENCOUNTER — Telehealth: Payer: Self-pay | Admitting: Cardiology

## 2022-07-11 NOTE — Telephone Encounter (Signed)
Called patient to share CT results however with no answer. LVM to return call.   Georgie Chard NP-C Structural Heart Team  Pager: [phone] Phone: [phone]

## 2022-07-16 NOTE — Telephone Encounter (Signed)
CT looks good. The Watchman is in good position without leak. OK to continue with planned post implant medication regimen.   Sheria Lang T. Lalla Brothers, MD, [facility], [facility] ...  Written by [name], MD on 07/11/2022  6:30 AM EDT View Full Comments Seen by patient Shanon Brow on 07/11/2022  7:45 AM    See result note. The patient viewed Dr. Lovena Neighbours comments.

## 2022-08-23 IMAGING — CT CT CHEST LUNG CANCER SCREENING LOW DOSE W/O CM
1 series · 15 of 31 positions shown, 19 images · non-contrast
Comparison: 06/13/2020.

CLINICAL DATA: Former smoker, quit 13 years ago, 100 pack-year
history.



[Series 2: ldct screening <30 bmi · axial · 0.62mm/px · z∈[-269,+21]mm · 15 of 64 slices shown, 19 images]
[im 3/64  mediastinal]
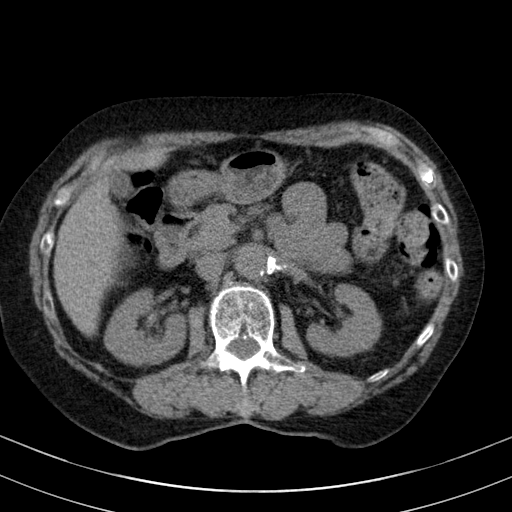
[im 3/64  lung]
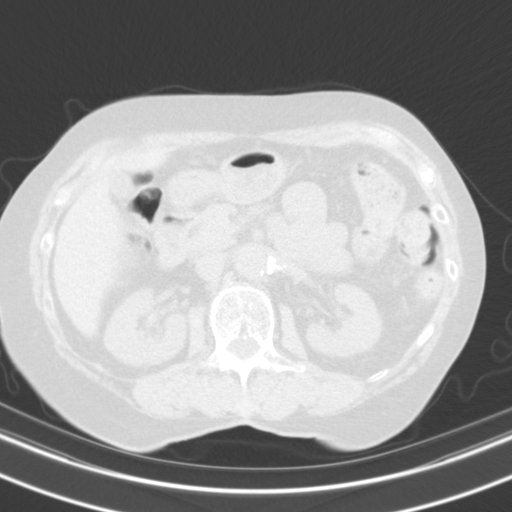
[im 8/64  lung]
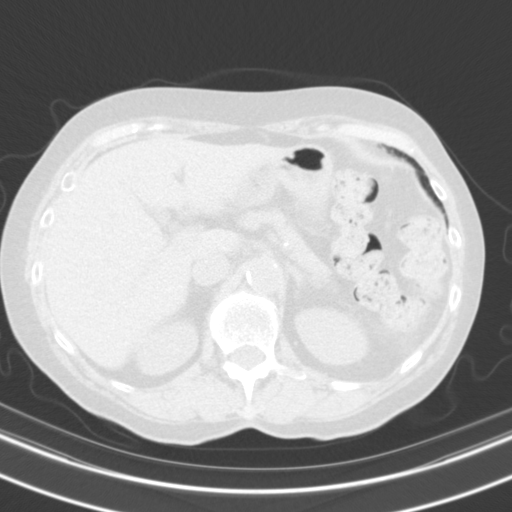
[im 12/64  lung]
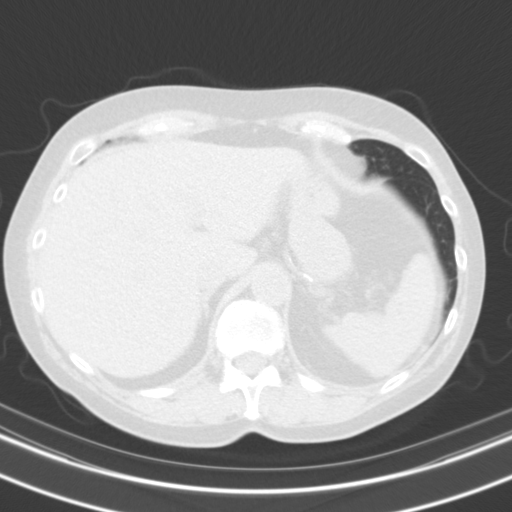
[im 15/64  lung]
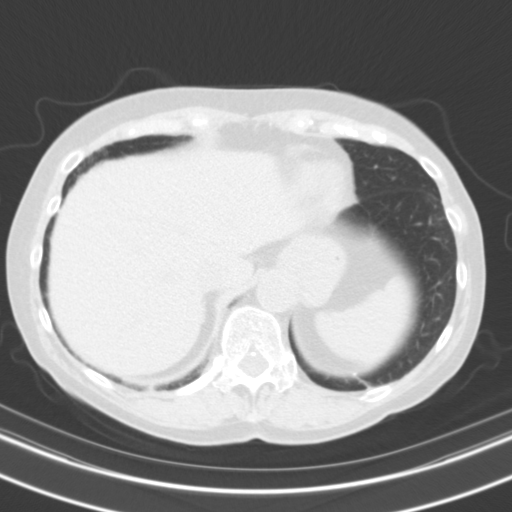
[im 19/64  mediastinal]
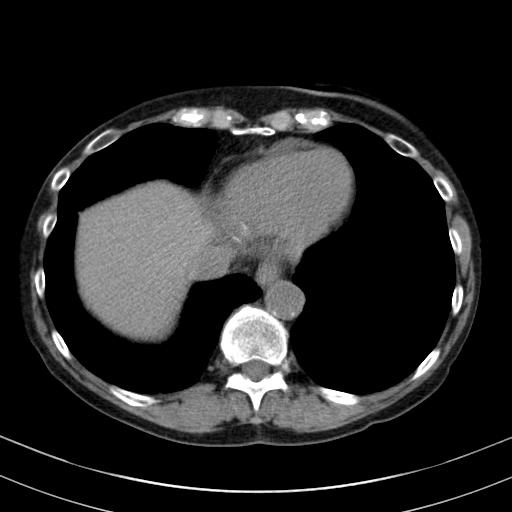
[im 19/64  lung]
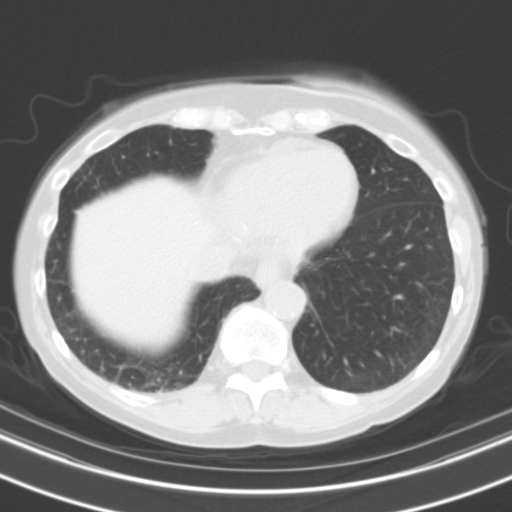
[im 24/64  lung]
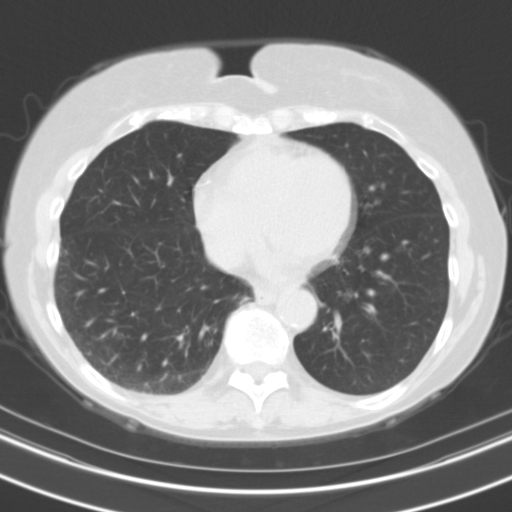
[im 29/64  lung]
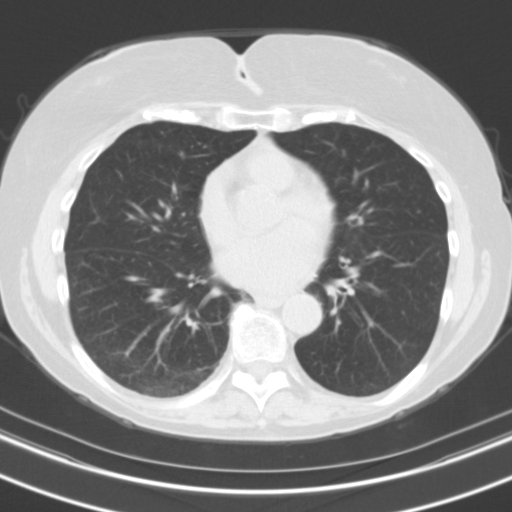
[im 33/64  lung]
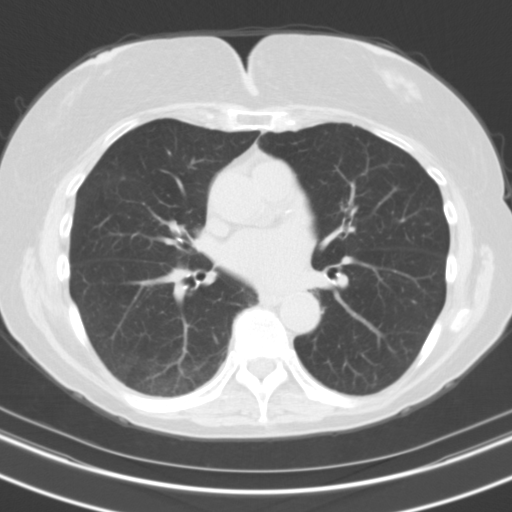
[im 36/64  mediastinal]
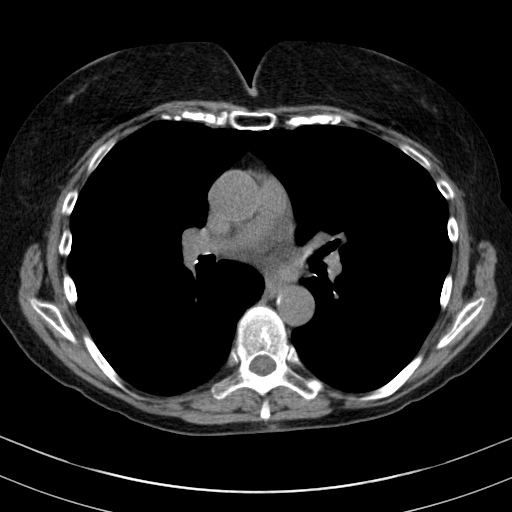
[im 36/64  lung]
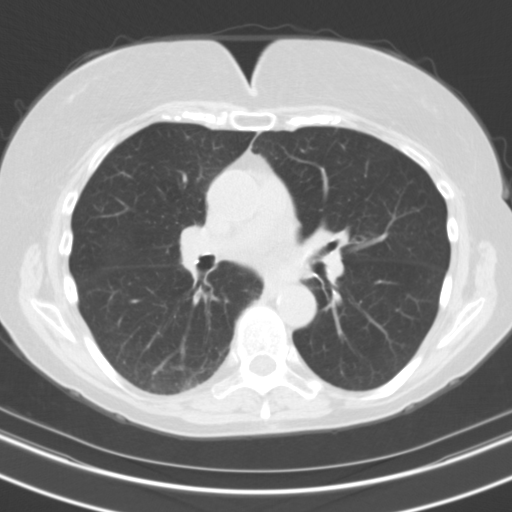
[im 40/64  lung]
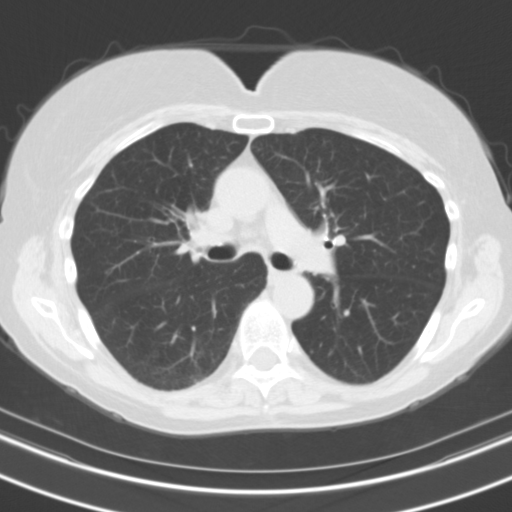
[im 45/64  lung]
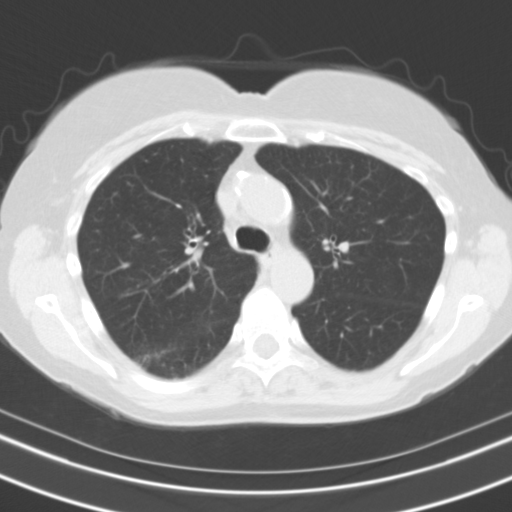
[im 50/64  lung]
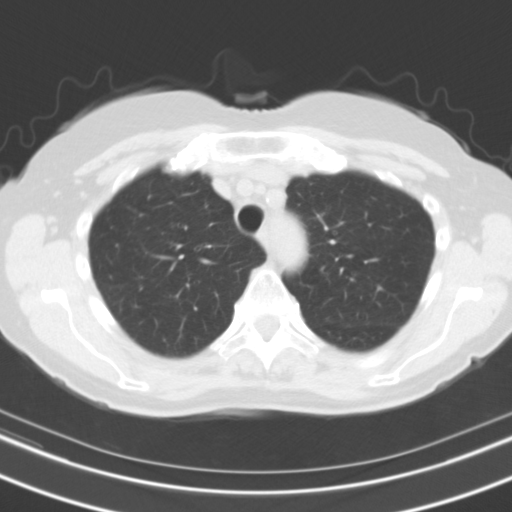
[im 52/64  mediastinal]
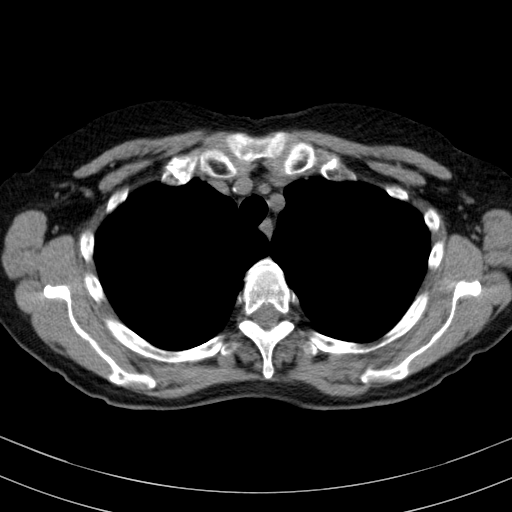
[im 52/64  lung]
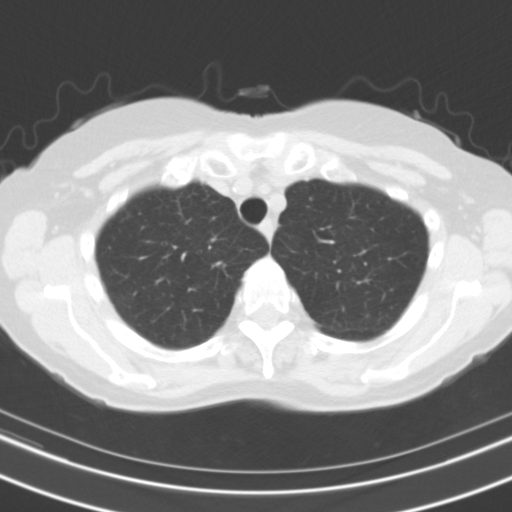
[im 57/64  lung]
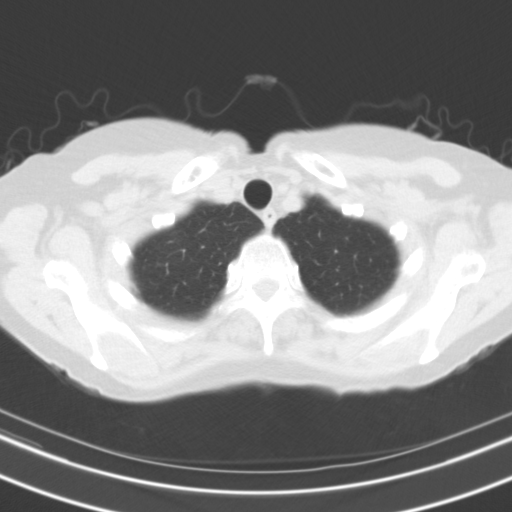
[im 61/64  lung]
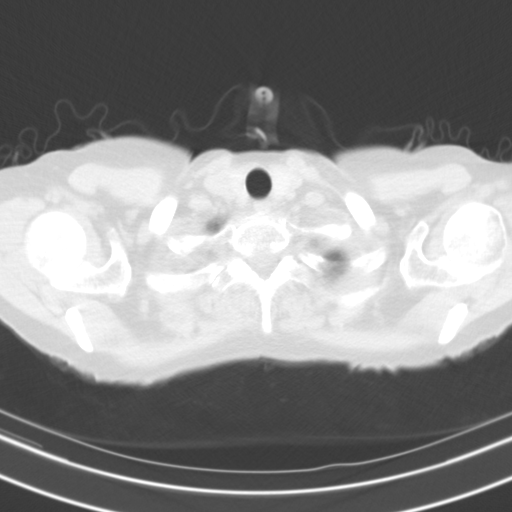

[15 of 31 positions shown; findings below may reference images not displayed]

FINDINGS: Cardiovascular: Atherosclerotic calcification of the aorta, aortic
valve and coronary arteries. Heart size normal. No pericardial
effusion.

Mediastinum/Nodes: No pathologically enlarged mediastinal or
axillary lymph nodes. Hilar regions are difficult to definitively
evaluate without IV contrast. Esophagus is grossly unremarkable.

Lungs/Pleura: biapical pleuroparenchymal scarring. Centrilobular
emphysema. Calcified granulomas. Scattered pulmonary parenchymal
scarring. Basilar and peripheral predominant coarsened ground-glass,
right greater than left, as before. Pulmonary nodules measure 2.5 mm
or less in size. No new or suspicious pulmonary nodules. No pleural
fluid. Airway is unremarkable.

Upper Abdomen: Visualized portions of the liver, gallbladder,
adrenal glands, kidneys, spleen, pancreas, stomach and bowel are
grossly unremarkable.

Musculoskeletal: Degenerative changes in the spine. Mild compression
of the T12 superior endplate, unchanged. No worrisome lytic or
sclerotic lesions.
IMPRESSION: 1. Lung-RADS 2, benign appearance or behavior. Continue annual
screening with low-dose chest CT without contrast in 12 months.
2. Basilar and peripheral mildly coarsened pulmonary parenchymal
ground-glass, right greater than left, similar. If there is concern
for interstitial lung disease, high-resolution chest CT without
contrast could be performed.
3. Aortic atherosclerosis (LHJUE-L9C.C). Coronary artery
calcification.
4.  Emphysema ([id number].Y).

## 2022-08-27 ENCOUNTER — Other Ambulatory Visit: Payer: Self-pay

## 2022-08-27 MED ORDER — ROSUVASTATIN CALCIUM 40 MG PO TABS: ORAL_TABLET | ORAL | 2 refills | Status: DC

## 2022-08-27 NOTE — Telephone Encounter (Signed)
Pt's medication was sent to pt's pharmacy as requested. Confirmation received.  °

## 2022-09-05 DIAGNOSIS — Z85828 Personal history of other malignant neoplasm of skin: Secondary | ICD-10-CM | POA: Diagnosis not present

## 2022-09-05 DIAGNOSIS — L814 Other melanin hyperpigmentation: Secondary | ICD-10-CM | POA: Diagnosis not present

## 2022-09-05 DIAGNOSIS — C44319 Basal cell carcinoma of skin of other parts of face: Secondary | ICD-10-CM | POA: Diagnosis not present

## 2022-09-05 DIAGNOSIS — L821 Other seborrheic keratosis: Secondary | ICD-10-CM | POA: Diagnosis not present

## 2022-09-05 DIAGNOSIS — C44311 Basal cell carcinoma of skin of nose: Secondary | ICD-10-CM | POA: Diagnosis not present

## 2022-09-05 DIAGNOSIS — L570 Actinic keratosis: Secondary | ICD-10-CM | POA: Diagnosis not present

## 2022-09-24 DIAGNOSIS — T63461A Toxic effect of venom of wasps, accidental (unintentional), initial encounter: Secondary | ICD-10-CM | POA: Diagnosis not present

## 2022-11-11 NOTE — Progress Notes (Unsigned)
[facility]                                     Cardiology Office Note:    Date:  11/13/2022   ID:  Carly Myers, DOB 10-07-1945, MRN [id number]  PCP:  [name], MD  [facility] Cardiologist:  [name], MD  [facility] Electrophysiologist:  [name], MD   Referring MD: [name], MD   Chief Complaint  Patient presents with   6 month s/p LAAO    History of Present Illness:    Carly Myers is a 77 y.o. female with a hx of paroxysmal atrial fibrillation, CAD, hypertension, hyperlipidemia, arthritis, and major depression who underwent AF ablation 01/23/22 and is now s/p LAAO with Watchman 05/15/2022.   She has been follow by Dr. Tenny Craw for her AF and was referred to Dr. Lalla Brothers after her arthritis pain became intolerable without the use of NSAIDs. She underwent left atrial appendage occlusive device placement with Watchman FLX 20mm device on 05/15/2022. She was continued on Xarelto for 45 days and transitioned to Plavix 75mg  on 06/30/2022. Post LAAO CT with good device seal and no thrombus.   Today she reports that she has been well with no concerns. She denies chest pain, SOB, palpitations, LE edema, orthopnea, PND, dizziness, or syncope. Denies bleeding in stool or urine.    Past Medical History:  Diagnosis Date   Allergic rhinitis    Anaphylactic reaction to bee sting    Anxiety state    unspecified   Arthritis    Ortho Dr. 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 Lalla Brothers   [facility]    Sensorineural hearing  loss (SNHL) of both ears 05/08/2021   Dr. [name] 2022   Shingles    Sinusitis    UTI (urinary tract infection)    Vitamin D deficiency     Past Surgical History:  Procedure Laterality Date   ATRIAL FIBRILLATION ABLATION N/A 01/23/2022   Procedure: ATRIAL FIBRILLATION ABLATION;  Surgeon: [name], MD;  Location: [facility] INVASIVE CV LAB;  Service: Cardiovascular;  Laterality: N/A;   LEFT ATRIAL APPENDAGE OCCLUSION N/A 05/15/2022   Procedure: LEFT ATRIAL APPENDAGE OCCLUSION;  Surgeon: [name], MD;  Location: [facility] INVASIVE CV LAB;  Service: Cardiovascular;  Laterality: N/A;   NO PAST SURGERIES     TEE WITHOUT CARDIOVERSION N/A 05/15/2022   Procedure: TRANSESOPHAGEAL ECHOCARDIOGRAM (TEE);  Surgeon: [name], MD;  Location: [facility] INVASIVE CV LAB;  Service: Cardiovascular;  Laterality: N/A;    Current Medications: Current Meds  Medication Sig   acetaminophen (TYLENOL) 500 MG tablet Take 500 mg by mouth every 6 (six) hours as needed for moderate pain.   COLLAGEN PO Take 1 tablet by mouth daily. With biotin and vitamin c   diazepam (VALIUM) 5 MG tablet Take 0.5-1 tablets (2.5-5 mg total) by mouth 2 (two)  Date of Birth:  1945/12/07     BSA:          1.653 m Patient Age:    19 years      BP:           132/74 mmHg Patient Gender: F             HR:           61 bpm. Exam Location:  [address]  Procedure: 2D Echo, Cardiac Doppler and Color Doppler  Indications:    I48.91 Atrial Fibrillation  History:        Patient has prior history of Echocardiogram examinations, most recent 11/07/2020. Arrythmias:Atrial Fibrillation; Risk Factors:Hypertension and Dyslipidemia.  Sonographer:    Daphine Deutscher RDCS Referring Phys: [id number] Rossie Muskrat LAMBERT  IMPRESSIONS   1. Left ventricular ejection fraction, by estimation, is 60 to 65%. The left ventricle has normal function. The left ventricle has no regional wall motion abnormalities. Left ventricular diastolic parameters were normal. 2. Right ventricular systolic function is normal. The right ventricular size is normal. 3. Left atrial size was moderately dilated. 4. The mitral valve is abnormal. Trivial mitral valve regurgitation. No evidence of mitral stenosis. 5. The aortic valve is tricuspid. There is mild calcification of the aortic valve. There is mild thickening of the aortic valve. Aortic valve regurgitation is trivial. Aortic valve sclerosis is present, with no evidence of aortic valve stenosis. 6. The inferior vena cava is normal in size with greater than 50% respiratory variability, suggesting right atrial pressure of 3 mmHg.  FINDINGS Left Ventricle: Left ventricular ejection fraction, by estimation, is 60 to 65%. The left ventricle has normal function. The left ventricle has no regional wall  motion abnormalities. The left ventricular internal cavity size was normal in size. There is no left ventricular hypertrophy. Left ventricular diastolic parameters were normal.  Right Ventricle: The right ventricular size is normal. No increase in right ventricular wall thickness. Right ventricular systolic function is normal.  Left Atrium: Left atrial size was moderately dilated.  Right Atrium: Right atrial size was normal in size.  Pericardium: There is no evidence of pericardial effusion.  Mitral Valve: The mitral valve is abnormal. There is mild thickening of the mitral valve leaflet(s). Mild mitral annular calcification. Trivial mitral valve regurgitation. No evidence of mitral valve stenosis.  Tricuspid Valve: The tricuspid valve is normal in structure. Tricuspid valve regurgitation is mild . No evidence of tricuspid stenosis.  Aortic Valve: The aortic valve is tricuspid. There is mild calcification of the aortic valve. There is mild thickening of the aortic valve. Aortic valve regurgitation is trivial. Aortic valve sclerosis is present, with no evidence of aortic valve stenosis.  Pulmonic Valve: The pulmonic valve was normal in structure. Pulmonic valve regurgitation is not visualized. No evidence of pulmonic stenosis.  Aorta: The aortic root is normal in size and structure.  Venous: The inferior vena cava is normal in size with greater than 50% respiratory variability, suggesting right atrial pressure of 3 mmHg.  IAS/Shunts: No atrial level shunt detected by color flow Doppler.   LEFT VENTRICLE PLAX 2D LVIDd:         3.60 cm   Diastology LVIDs:         2.40 cm   LV e' medial:    9.25 cm/s LV PW:         0.90 cm   LV E/e' medial:  6.7 LV IVS:        0.90 cm  Date of Birth:  1945/12/07     BSA:          1.653 m Patient Age:    19 years      BP:           132/74 mmHg Patient Gender: F             HR:           61 bpm. Exam Location:  [address]  Procedure: 2D Echo, Cardiac Doppler and Color Doppler  Indications:    I48.91 Atrial Fibrillation  History:        Patient has prior history of Echocardiogram examinations, most recent 11/07/2020. Arrythmias:Atrial Fibrillation; Risk Factors:Hypertension and Dyslipidemia.  Sonographer:    Daphine Deutscher RDCS Referring Phys: [id number] Rossie Muskrat LAMBERT  IMPRESSIONS   1. Left ventricular ejection fraction, by estimation, is 60 to 65%. The left ventricle has normal function. The left ventricle has no regional wall motion abnormalities. Left ventricular diastolic parameters were normal. 2. Right ventricular systolic function is normal. The right ventricular size is normal. 3. Left atrial size was moderately dilated. 4. The mitral valve is abnormal. Trivial mitral valve regurgitation. No evidence of mitral stenosis. 5. The aortic valve is tricuspid. There is mild calcification of the aortic valve. There is mild thickening of the aortic valve. Aortic valve regurgitation is trivial. Aortic valve sclerosis is present, with no evidence of aortic valve stenosis. 6. The inferior vena cava is normal in size with greater than 50% respiratory variability, suggesting right atrial pressure of 3 mmHg.  FINDINGS Left Ventricle: Left ventricular ejection fraction, by estimation, is 60 to 65%. The left ventricle has normal function. The left ventricle has no regional wall  motion abnormalities. The left ventricular internal cavity size was normal in size. There is no left ventricular hypertrophy. Left ventricular diastolic parameters were normal.  Right Ventricle: The right ventricular size is normal. No increase in right ventricular wall thickness. Right ventricular systolic function is normal.  Left Atrium: Left atrial size was moderately dilated.  Right Atrium: Right atrial size was normal in size.  Pericardium: There is no evidence of pericardial effusion.  Mitral Valve: The mitral valve is abnormal. There is mild thickening of the mitral valve leaflet(s). Mild mitral annular calcification. Trivial mitral valve regurgitation. No evidence of mitral valve stenosis.  Tricuspid Valve: The tricuspid valve is normal in structure. Tricuspid valve regurgitation is mild . No evidence of tricuspid stenosis.  Aortic Valve: The aortic valve is tricuspid. There is mild calcification of the aortic valve. There is mild thickening of the aortic valve. Aortic valve regurgitation is trivial. Aortic valve sclerosis is present, with no evidence of aortic valve stenosis.  Pulmonic Valve: The pulmonic valve was normal in structure. Pulmonic valve regurgitation is not visualized. No evidence of pulmonic stenosis.  Aorta: The aortic root is normal in size and structure.  Venous: The inferior vena cava is normal in size with greater than 50% respiratory variability, suggesting right atrial pressure of 3 mmHg.  IAS/Shunts: No atrial level shunt detected by color flow Doppler.   LEFT VENTRICLE PLAX 2D LVIDd:         3.60 cm   Diastology LVIDs:         2.40 cm   LV e' medial:    9.25 cm/s LV PW:         0.90 cm   LV E/e' medial:  6.7 LV IVS:        0.90 cm  Date of Birth:  1945/12/07     BSA:          1.653 m Patient Age:    19 years      BP:           132/74 mmHg Patient Gender: F             HR:           61 bpm. Exam Location:  [address]  Procedure: 2D Echo, Cardiac Doppler and Color Doppler  Indications:    I48.91 Atrial Fibrillation  History:        Patient has prior history of Echocardiogram examinations, most recent 11/07/2020. Arrythmias:Atrial Fibrillation; Risk Factors:Hypertension and Dyslipidemia.  Sonographer:    Daphine Deutscher RDCS Referring Phys: [id number] Rossie Muskrat LAMBERT  IMPRESSIONS   1. Left ventricular ejection fraction, by estimation, is 60 to 65%. The left ventricle has normal function. The left ventricle has no regional wall motion abnormalities. Left ventricular diastolic parameters were normal. 2. Right ventricular systolic function is normal. The right ventricular size is normal. 3. Left atrial size was moderately dilated. 4. The mitral valve is abnormal. Trivial mitral valve regurgitation. No evidence of mitral stenosis. 5. The aortic valve is tricuspid. There is mild calcification of the aortic valve. There is mild thickening of the aortic valve. Aortic valve regurgitation is trivial. Aortic valve sclerosis is present, with no evidence of aortic valve stenosis. 6. The inferior vena cava is normal in size with greater than 50% respiratory variability, suggesting right atrial pressure of 3 mmHg.  FINDINGS Left Ventricle: Left ventricular ejection fraction, by estimation, is 60 to 65%. The left ventricle has normal function. The left ventricle has no regional wall  motion abnormalities. The left ventricular internal cavity size was normal in size. There is no left ventricular hypertrophy. Left ventricular diastolic parameters were normal.  Right Ventricle: The right ventricular size is normal. No increase in right ventricular wall thickness. Right ventricular systolic function is normal.  Left Atrium: Left atrial size was moderately dilated.  Right Atrium: Right atrial size was normal in size.  Pericardium: There is no evidence of pericardial effusion.  Mitral Valve: The mitral valve is abnormal. There is mild thickening of the mitral valve leaflet(s). Mild mitral annular calcification. Trivial mitral valve regurgitation. No evidence of mitral valve stenosis.  Tricuspid Valve: The tricuspid valve is normal in structure. Tricuspid valve regurgitation is mild . No evidence of tricuspid stenosis.  Aortic Valve: The aortic valve is tricuspid. There is mild calcification of the aortic valve. There is mild thickening of the aortic valve. Aortic valve regurgitation is trivial. Aortic valve sclerosis is present, with no evidence of aortic valve stenosis.  Pulmonic Valve: The pulmonic valve was normal in structure. Pulmonic valve regurgitation is not visualized. No evidence of pulmonic stenosis.  Aorta: The aortic root is normal in size and structure.  Venous: The inferior vena cava is normal in size with greater than 50% respiratory variability, suggesting right atrial pressure of 3 mmHg.  IAS/Shunts: No atrial level shunt detected by color flow Doppler.   LEFT VENTRICLE PLAX 2D LVIDd:         3.60 cm   Diastology LVIDs:         2.40 cm   LV e' medial:    9.25 cm/s LV PW:         0.90 cm   LV E/e' medial:  6.7 LV IVS:        0.90 cm  Date of Birth:  1945/12/07     BSA:          1.653 m Patient Age:    19 years      BP:           132/74 mmHg Patient Gender: F             HR:           61 bpm. Exam Location:  [address]  Procedure: 2D Echo, Cardiac Doppler and Color Doppler  Indications:    I48.91 Atrial Fibrillation  History:        Patient has prior history of Echocardiogram examinations, most recent 11/07/2020. Arrythmias:Atrial Fibrillation; Risk Factors:Hypertension and Dyslipidemia.  Sonographer:    Daphine Deutscher RDCS Referring Phys: [id number] Rossie Muskrat LAMBERT  IMPRESSIONS   1. Left ventricular ejection fraction, by estimation, is 60 to 65%. The left ventricle has normal function. The left ventricle has no regional wall motion abnormalities. Left ventricular diastolic parameters were normal. 2. Right ventricular systolic function is normal. The right ventricular size is normal. 3. Left atrial size was moderately dilated. 4. The mitral valve is abnormal. Trivial mitral valve regurgitation. No evidence of mitral stenosis. 5. The aortic valve is tricuspid. There is mild calcification of the aortic valve. There is mild thickening of the aortic valve. Aortic valve regurgitation is trivial. Aortic valve sclerosis is present, with no evidence of aortic valve stenosis. 6. The inferior vena cava is normal in size with greater than 50% respiratory variability, suggesting right atrial pressure of 3 mmHg.  FINDINGS Left Ventricle: Left ventricular ejection fraction, by estimation, is 60 to 65%. The left ventricle has normal function. The left ventricle has no regional wall  motion abnormalities. The left ventricular internal cavity size was normal in size. There is no left ventricular hypertrophy. Left ventricular diastolic parameters were normal.  Right Ventricle: The right ventricular size is normal. No increase in right ventricular wall thickness. Right ventricular systolic function is normal.  Left Atrium: Left atrial size was moderately dilated.  Right Atrium: Right atrial size was normal in size.  Pericardium: There is no evidence of pericardial effusion.  Mitral Valve: The mitral valve is abnormal. There is mild thickening of the mitral valve leaflet(s). Mild mitral annular calcification. Trivial mitral valve regurgitation. No evidence of mitral valve stenosis.  Tricuspid Valve: The tricuspid valve is normal in structure. Tricuspid valve regurgitation is mild . No evidence of tricuspid stenosis.  Aortic Valve: The aortic valve is tricuspid. There is mild calcification of the aortic valve. There is mild thickening of the aortic valve. Aortic valve regurgitation is trivial. Aortic valve sclerosis is present, with no evidence of aortic valve stenosis.  Pulmonic Valve: The pulmonic valve was normal in structure. Pulmonic valve regurgitation is not visualized. No evidence of pulmonic stenosis.  Aorta: The aortic root is normal in size and structure.  Venous: The inferior vena cava is normal in size with greater than 50% respiratory variability, suggesting right atrial pressure of 3 mmHg.  IAS/Shunts: No atrial level shunt detected by color flow Doppler.   LEFT VENTRICLE PLAX 2D LVIDd:         3.60 cm   Diastology LVIDs:         2.40 cm   LV e' medial:    9.25 cm/s LV PW:         0.90 cm   LV E/e' medial:  6.7 LV IVS:        0.90 cm  [facility]                                     Cardiology Office Note:    Date:  11/13/2022   ID:  Carly Myers, DOB 10-07-1945, MRN [id number]  PCP:  [name], MD  [facility] Cardiologist:  [name], MD  [facility] Electrophysiologist:  [name], MD   Referring MD: [name], MD   Chief Complaint  Patient presents with   6 month s/p LAAO    History of Present Illness:    Carly Myers is a 77 y.o. female with a hx of paroxysmal atrial fibrillation, CAD, hypertension, hyperlipidemia, arthritis, and major depression who underwent AF ablation 01/23/22 and is now s/p LAAO with Watchman 05/15/2022.   She has been follow by Dr. Tenny Craw for her AF and was referred to Dr. Lalla Brothers after her arthritis pain became intolerable without the use of NSAIDs. She underwent left atrial appendage occlusive device placement with Watchman FLX 20mm device on 05/15/2022. She was continued on Xarelto for 45 days and transitioned to Plavix 75mg  on 06/30/2022. Post LAAO CT with good device seal and no thrombus.   Today she reports that she has been well with no concerns. She denies chest pain, SOB, palpitations, LE edema, orthopnea, PND, dizziness, or syncope. Denies bleeding in stool or urine.    Past Medical History:  Diagnosis Date   Allergic rhinitis    Anaphylactic reaction to bee sting    Anxiety state    unspecified   Arthritis    Ortho Dr. 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 Lalla Brothers   [facility])    Sensorineural hearing  loss (SNHL) of both ears 05/08/2021   Dr. [name] 2022   Shingles    Sinusitis    UTI (urinary tract infection)    Vitamin D deficiency     Past Surgical History:  Procedure Laterality Date   ATRIAL FIBRILLATION ABLATION N/A 01/23/2022   Procedure: ATRIAL FIBRILLATION ABLATION;  Surgeon: [name], MD;  Location: [facility];  Service: Cardiovascular;  Laterality: N/A;   LEFT ATRIAL APPENDAGE OCCLUSION N/A 05/15/2022   Procedure: LEFT ATRIAL APPENDAGE OCCLUSION;  Surgeon: [name], MD;  Location: [facility];  Service: Cardiovascular;  Laterality: N/A;   NO PAST SURGERIES     TEE WITHOUT CARDIOVERSION N/A 05/15/2022   Procedure: TRANSESOPHAGEAL ECHOCARDIOGRAM (TEE);  Surgeon: [name], MD;  Location: [facility] INVASIVE CV LAB;  Service: Cardiovascular;  Laterality: N/A;    Current Medications: Current Meds  Medication Sig   acetaminophen (TYLENOL) 500 MG tablet Take 500 mg by mouth every 6 (six) hours as needed for moderate pain.   COLLAGEN PO Take 1 tablet by mouth daily. With biotin and vitamin c   diazepam (VALIUM) 5 MG tablet Take 0.5-1 tablets (2.5-5 mg total) by mouth 2 (two)

## 2022-11-12 ENCOUNTER — Ambulatory Visit: Payer: Medicare Other | Attending: Cardiology | Admitting: Cardiology

## 2022-11-12 VITALS — BP 122/68 | HR 70 | Ht 66.0 in | Wt 122.4 lb

## 2022-11-12 DIAGNOSIS — M1990 Unspecified osteoarthritis, unspecified site: Secondary | ICD-10-CM | POA: Diagnosis not present

## 2022-11-12 DIAGNOSIS — Z95818 Presence of other cardiac implants and grafts: Secondary | ICD-10-CM | POA: Diagnosis not present

## 2022-11-12 DIAGNOSIS — I10 Essential (primary) hypertension: Secondary | ICD-10-CM | POA: Insufficient documentation

## 2022-11-12 DIAGNOSIS — I480 Paroxysmal atrial fibrillation: Secondary | ICD-10-CM | POA: Diagnosis not present

## 2022-11-12 NOTE — Patient Instructions (Signed)
Medication Instructions:  Your physician recommends that you continue on your current medications as directed. Please refer to the Current Medication list given to you today.  **You will discontinue taking clopidogrel (Plavix) on 11/15/2022.**  *If you need a refill on your cardiac medications before your next appointment, please call your pharmacy*  Lab Work: None ordered today.  Testing/Procedures: None ordered today.  Follow-Up: At [facility], you and your health needs are our priority.  As part of our continuing mission to provide you with exceptional heart care, we have created designated Provider Care Teams.  These Care Teams include your primary Cardiologist (physician) and Advanced Practice Providers (APPs -  Physician Assistants and Nurse Practitioners) who all work together to provide you with the care you need, when you need it.   Your next appointment:   1 month(s) with Dr. Tenny Craw 6 months with Noreene Larsson or Florentina Addison   The format for your next appointment:   In Person with Dr. Tenny Craw Telephone Visit with Noreene Larsson or Katie  Provider:   [name], MD  Georgie Chard, NP or Carlean Jews, PA-C{

## 2022-11-28 ENCOUNTER — Other Ambulatory Visit: Payer: Self-pay | Admitting: Internal Medicine

## 2022-12-11 DIAGNOSIS — M8588 Other specified disorders of bone density and structure, other site: Secondary | ICD-10-CM | POA: Diagnosis not present

## 2022-12-11 DIAGNOSIS — Z1231 Encounter for screening mammogram for malignant neoplasm of breast: Secondary | ICD-10-CM | POA: Diagnosis not present

## 2022-12-11 DIAGNOSIS — Z8262 Family history of osteoporosis: Secondary | ICD-10-CM | POA: Diagnosis not present

## 2022-12-11 LAB — HM MAMMOGRAPHY

## 2022-12-11 LAB — HM DEXA SCAN

## 2022-12-12 ENCOUNTER — Encounter: Payer: Self-pay | Admitting: Family Medicine

## 2022-12-16 DIAGNOSIS — M1712 Unilateral primary osteoarthritis, left knee: Secondary | ICD-10-CM | POA: Diagnosis not present

## 2022-12-23 DIAGNOSIS — M1712 Unilateral primary osteoarthritis, left knee: Secondary | ICD-10-CM | POA: Diagnosis not present

## 2023-01-04 NOTE — Progress Notes (Unsigned)
Cardiology Office Note   Date:  01/06/2023   ID:  Carly Myers, DOB Apr 02, 1945, MRN [id number]  PCP:  [name], MD  Cardiologist:   [name], MD   F/U of atrial fibrillation     History of Present Illness: Carly Myers is a 77 y.o. female with hx of CAD on CT  Chest CT showed Aortic atherosclerosis wiht calcification of LAD noted  The pt also has hx of PAF/ paroxysmal atrial flutter on Holter  Started on Toprol XL and Xarelto   Echo showed LVEF normal   The pt also has a hx of vertigo    The pt undwent afib ablation in Nov 2023    She went on to have Watchman device in March 2024 by [name]  I saw the pt in April 2024   She was seen by [name] in Sept 2024   The pt says she is feeling good  Denies palpitations   Glad she isnt bruising as much now that off of anticoagulation.    She denies CP  No SOB    Current Meds  Medication Sig   acetaminophen (TYLENOL) 500 MG tablet Take 500 mg by mouth every 6 (six) hours as needed for moderate pain.   COLLAGEN PO Take 1 tablet by mouth daily. With biotin and vitamin c   diazepam (VALIUM) 5 MG tablet Take 0.5-1 tablets (2.5-5 mg total) by mouth 2 (two) times daily as needed (vertigo). (Patient taking differently: Take 0.625 mg by mouth 2 (two) times daily as needed (vertigo).)   docusate sodium (COLACE) 100 MG capsule Take 100 mg by mouth daily as needed for mild constipation.   EPINEPHrine 0.3 mg/0.3 mL IJ SOAJ injection Inject 0.3 mg into the muscle as needed for anaphylaxis.   fluticasone (FLONASE) 50 MCG/ACT nasal spray Place 1 spray into both nostrils daily as needed for allergies or rhinitis.   Homeopathic Products (RESTFUL LEGS PM SL) Place 1 tablet under the tongue at bedtime.   LORazepam (ATIVAN) 0.5 MG tablet Take 1 tablet (0.5 mg total) by mouth at bedtime as needed for sleep.   metoprolol succinate (TOPROL-XL) 25 MG 24 hr tablet TAKE 1 TABLET IN THE MORNING AND AT BEDTIME   Multiple Vitamin (MULTIVITAMIN) tablet Take 1  tablet by mouth daily.   OVER THE COUNTER MEDICATION Apply 1 Application topically at bedtime. Hemp cream   rosuvastatin (CRESTOR) 40 MG tablet Take 20 mg by mouth on Sun, Tues, Thurs, and Sat. Then Take 40 mg by mouth on Mon, Wed, Fri     Allergies:   Bee venom   Past Medical History:  Diagnosis Date   Allergic rhinitis    Anaphylactic reaction to bee sting    Anxiety state    unspecified   Arthritis    Ortho Dr. 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 Lalla Brothers   Senile purpura [facility])    Sensorineural hearing loss (SNHL) of both ears 05/08/2021   Dr. [name] 2022  Shingles    Sinusitis    UTI (urinary tract infection)    Vitamin D deficiency     Past Surgical History:  Procedure Laterality Date   ATRIAL FIBRILLATION ABLATION N/A 01/23/2022   Procedure: ATRIAL FIBRILLATION ABLATION;  Surgeon: [name], MD;  Location: [facility] INVASIVE CV LAB;  Service: Cardiovascular;  Laterality: N/A;   LEFT ATRIAL APPENDAGE OCCLUSION N/A 05/15/2022   Procedure: LEFT ATRIAL APPENDAGE OCCLUSION;  Surgeon: [name], MD;  Location: [facility] INVASIVE CV LAB;  Service: Cardiovascular;  Laterality: N/A;   NO PAST SURGERIES     TEE WITHOUT CARDIOVERSION N/A 05/15/2022   Procedure: TRANSESOPHAGEAL ECHOCARDIOGRAM (TEE);  Surgeon: [name], MD;  Location: [facility] INVASIVE CV LAB;  Service: Cardiovascular;  Laterality: N/A;     Social History:  The patient  reports that she quit smoking about 14 years ago. Her smoking use included cigarettes. She has never used smokeless  tobacco. She reports current alcohol use. She reports that she does not use drugs.   Family History:  The patient's family history is not on file.    ROS:  Please see the history of present illness. All other systems are reviewed and  Negative to the above problem except as noted.    PHYSICAL EXAM: VS:  BP 114/70   Pulse 82   Ht 5\' 6"  (1.676 m)   Wt 124 lb 6.4 oz (56.4 kg)   SpO2 98%   BMI 20.08 kg/m    GEN: Thin 77 yo  in no acute distress  HEENT: normal  Neck: JVP is normal  No bruit Cardiac: RRR  No murmurs   Respiratory:  clear to auscultation  GI: soft, nontender  No hepatomegaly    EKG:  EKG is not ordered today     Cardiac CT (May 2024)  1. There is normal pulmonary vein drainage with no evidence of pulmonary vein stenosis.   2. Watchman Flx, 20 mm, with incomplete endothelialization and mitral shoulder as noted above.   4. Coronary calcium score of 218, which is 68th percentile for age, sex, and race matched control.    Echo   May 2023  1. Left ventricular ejection fraction, by estimation, is 60 to 65% . The left ventricle has normal function. The left ventricle has no regional wall motion abnormalities. Left ventricular diastolic parameters were normal. 2. Right ventricular systolic function is normal. The right ventricular size is normal. 3. Left atrial size was moderately dilated. 4. The mitral valve is abnormal. Trivial mitral valve regurgitation. No evidence of mitral stenosis. 5. The aortic valve is tricuspid. There is mild calcification of the aortic valve. There is mild thickening of the aortic valve. Aortic valve regurgitation is trivial. Aortic valve sclerosis is present, with no evidence of aortic valve stenosis. 6. The inferior vena cava is normal in size with greater than 50% respiratory variability, suggesting right atrial pressure of 3 mmHg. Sept 2022  Carotid   Summary: Right Carotid: Velocities in the right ICA are consistent with a 1-39%  stenosis. Left Carotid: Velocities in the left ICA are consistent with a 1-39% stenosis. Vertebrals: Bilateral vertebral arteries demonstrate antegrade flow. Subclavians: Normal flow hemodynamics were seen in bilateral subclavian arteries. Lipid Panel    Component Value Date/Time   CHOL 146 06/16/2022 1022   TRIG 73 06/16/2022 1022   HDL 61 06/16/2022 1022   CHOLHDL 2.4 06/16/2022 1022   CHOLHDL 3 05/09/2021 0921   VLDL 16.6 05/09/2021 0921   LDLCALC 71  06/16/2022 1022      Wt Readings from Last 3 Encounters:  01/06/23 124 lb 6.4 oz (56.4 kg)  11/12/22 122 lb 6.4 oz (55.5 kg)  06/18/22 122 lb 9.6 oz (55.6 kg)      ASSESSMENT AND PLAN:  1  PAF  Pt is s/p ablation and also s/o Watchman procedure.  Off of anticoag  2 CAD Found on CT scan Denies CP   3  HL  LDL 71 in April 2024    4  HTN  BP remains well controlled         Current medicines are reviewed at length with the patient today.  The patient does not have concerns regarding medicines.  Signed, [name], MD  01/06/2023 9:49 PM    [facility] [address] [place], [place], [place]  [address] Phone: [phone]; Fax: [phone]

## 2023-01-06 ENCOUNTER — Ambulatory Visit: Payer: Medicare Other | Attending: Internal Medicine | Admitting: Internal Medicine

## 2023-01-06 ENCOUNTER — Encounter: Payer: Self-pay | Admitting: Internal Medicine

## 2023-01-06 VITALS — BP 114/70 | HR 82 | Ht 66.0 in | Wt 124.4 lb

## 2023-01-06 DIAGNOSIS — I480 Paroxysmal atrial fibrillation: Secondary | ICD-10-CM | POA: Diagnosis not present

## 2023-01-06 NOTE — Patient Instructions (Signed)
Medication Instructions:   *If you need a refill on your cardiac medications before your next appointment, please call your pharmacy*   Lab Work:  If you have labs (blood work) drawn today and your tests are completely normal, you will receive your results only by: MyChart Message (if you have MyChart) OR A paper copy in the mail If you have any lab test that is abnormal or we need to change your treatment, we will call you to review the results.   Testing/Procedures:    Follow-Up: At [facility], you and your health needs are our priority.  As part of our continuing mission to provide you with exceptional heart care, we have created designated Provider Care Teams.  These Care Teams include your primary Cardiologist (physician) and Advanced Practice Providers (APPs -  Physician Assistants and Nurse Practitioners) who all work together to provide you with the care you need, when you need it.  We recommend signing up for the patient portal called "MyChart".  Sign up information is provided on this After Visit Summary.  MyChart is used to connect with patients for Virtual Visits (Telemedicine).  Patients are able to view lab/test results, encounter notes, upcoming appointments, etc.  Non-urgent messages can be sent to your provider as well.   To learn more about what you can do with MyChart, go to [email or web address].    Your next appointment:  end of Aug 2025

## 2023-01-12 DIAGNOSIS — C4401 Basal cell carcinoma of skin of lip: Secondary | ICD-10-CM | POA: Diagnosis not present

## 2023-01-12 DIAGNOSIS — Z85828 Personal history of other malignant neoplasm of skin: Secondary | ICD-10-CM | POA: Diagnosis not present

## 2023-01-19 DIAGNOSIS — Z85828 Personal history of other malignant neoplasm of skin: Secondary | ICD-10-CM | POA: Diagnosis not present

## 2023-01-19 DIAGNOSIS — L570 Actinic keratosis: Secondary | ICD-10-CM | POA: Diagnosis not present

## 2023-01-19 DIAGNOSIS — C44319 Basal cell carcinoma of skin of other parts of face: Secondary | ICD-10-CM | POA: Diagnosis not present

## 2023-01-21 ENCOUNTER — Ambulatory Visit (INDEPENDENT_AMBULATORY_CARE_PROVIDER_SITE_OTHER): Payer: Medicare Other

## 2023-03-09 DIAGNOSIS — C441192 Basal cell carcinoma of skin of left lower eyelid, including canthus: Secondary | ICD-10-CM | POA: Diagnosis not present

## 2023-03-09 DIAGNOSIS — Z85828 Personal history of other malignant neoplasm of skin: Secondary | ICD-10-CM | POA: Diagnosis not present

## 2023-04-01 DIAGNOSIS — H524 Presbyopia: Secondary | ICD-10-CM | POA: Diagnosis not present

## 2023-04-01 DIAGNOSIS — H26491 Other secondary cataract, right eye: Secondary | ICD-10-CM | POA: Diagnosis not present

## 2023-04-01 DIAGNOSIS — Z961 Presence of intraocular lens: Secondary | ICD-10-CM | POA: Diagnosis not present

## 2023-04-14 ENCOUNTER — Telehealth: Payer: Self-pay | Admitting: Physician Assistant

## 2023-04-14 NOTE — Telephone Encounter (Signed)
  [facility]   MULTIDISCIPLINARY HEART TEAM   Called to check in with patient, who had LAAO on 05/15/22. The patient reports doing well with no issues.  The patient understands to call with questions or concerns.   Cline Crock PA-C  [facility]

## 2023-05-06 ENCOUNTER — Telehealth: Payer: Medicare Other

## 2023-05-19 DIAGNOSIS — L570 Actinic keratosis: Secondary | ICD-10-CM | POA: Diagnosis not present

## 2023-05-19 DIAGNOSIS — Z85828 Personal history of other malignant neoplasm of skin: Secondary | ICD-10-CM | POA: Diagnosis not present

## 2023-05-19 DIAGNOSIS — D1801 Hemangioma of skin and subcutaneous tissue: Secondary | ICD-10-CM | POA: Diagnosis not present

## 2023-05-19 DIAGNOSIS — L720 Epidermal cyst: Secondary | ICD-10-CM | POA: Diagnosis not present

## 2023-05-27 ENCOUNTER — Other Ambulatory Visit: Payer: Self-pay | Admitting: Internal Medicine

## 2023-06-04 DIAGNOSIS — M1712 Unilateral primary osteoarthritis, left knee: Secondary | ICD-10-CM | POA: Diagnosis not present

## 2023-06-22 ENCOUNTER — Encounter: Payer: Self-pay | Admitting: Family Medicine

## 2023-06-22 ENCOUNTER — Ambulatory Visit (INDEPENDENT_AMBULATORY_CARE_PROVIDER_SITE_OTHER): Payer: Medicare Other | Admitting: Family Medicine

## 2023-06-22 VITALS — BP 116/62 | HR 65 | Temp 98.0°F | Ht 66.0 in | Wt 125.4 lb

## 2023-06-22 DIAGNOSIS — D692 Other nonthrombocytopenic purpura: Secondary | ICD-10-CM

## 2023-06-22 DIAGNOSIS — H81393 Other peripheral vertigo, bilateral: Secondary | ICD-10-CM | POA: Diagnosis not present

## 2023-06-22 DIAGNOSIS — M8580 Other specified disorders of bone density and structure, unspecified site: Secondary | ICD-10-CM

## 2023-06-22 DIAGNOSIS — R110 Nausea: Secondary | ICD-10-CM | POA: Diagnosis not present

## 2023-06-22 DIAGNOSIS — I10 Essential (primary) hypertension: Secondary | ICD-10-CM

## 2023-06-22 DIAGNOSIS — Z95818 Presence of other cardiac implants and grafts: Secondary | ICD-10-CM

## 2023-06-22 DIAGNOSIS — I679 Cerebrovascular disease, unspecified: Secondary | ICD-10-CM

## 2023-06-22 DIAGNOSIS — I480 Paroxysmal atrial fibrillation: Secondary | ICD-10-CM

## 2023-06-22 DIAGNOSIS — E782 Mixed hyperlipidemia: Secondary | ICD-10-CM

## 2023-06-22 DIAGNOSIS — E559 Vitamin D deficiency, unspecified: Secondary | ICD-10-CM | POA: Diagnosis not present

## 2023-06-22 DIAGNOSIS — R7303 Prediabetes: Secondary | ICD-10-CM

## 2023-06-22 LAB — CBC WITH DIFFERENTIAL/PLATELET
Basophils Absolute: 0.1 10*3/uL (ref 0.0–0.1)
Basophils Relative: 0.4 % (ref 0.0–3.0)
Eosinophils Absolute: 0 10*3/uL (ref 0.0–0.7)
Eosinophils Relative: 0.1 % (ref 0.0–5.0)
HCT: 37.7 % (ref 36.0–46.0)
Hemoglobin: 12.5 g/dL (ref 12.0–15.0)
Lymphocytes Relative: 8.1 % — ABNORMAL LOW (ref 12.0–46.0)
Lymphs Abs: 1 10*3/uL (ref 0.7–4.0)
MCHC: 33.1 g/dL (ref 30.0–36.0)
MCV: 98.2 fl (ref 78.0–100.0)
Monocytes Absolute: 0.7 10*3/uL (ref 0.1–1.0)
Monocytes Relative: 5.4 % (ref 3.0–12.0)
Neutro Abs: 10.8 10*3/uL — ABNORMAL HIGH (ref 1.4–7.7)
Neutrophils Relative %: 86 % — ABNORMAL HIGH (ref 43.0–77.0)
Platelets: 293 10*3/uL (ref 150.0–400.0)
RBC: 3.83 Mil/uL — ABNORMAL LOW (ref 3.87–5.11)
RDW: 14.7 % (ref 11.5–15.5)
WBC: 12.5 10*3/uL — ABNORMAL HIGH (ref 4.0–10.5)

## 2023-06-22 LAB — LIPID PANEL
Cholesterol: 128 mg/dL (ref 0–200)
HDL: 56.5 mg/dL (ref >39.00)
LDL Cholesterol: 61 mg/dL (ref 0–99)
NonHDL: 71.75
Total CHOL/HDL Ratio: 2
Triglycerides: 53 mg/dL (ref 0.0–149.0)
VLDL: 10.6 mg/dL (ref 0.0–40.0)

## 2023-06-22 LAB — COMPREHENSIVE METABOLIC PANEL WITH GFR
ALT: 22 U/L (ref 0–35)
AST: 25 U/L (ref 0–37)
Albumin: 4.1 g/dL (ref 3.5–5.2)
Alkaline Phosphatase: 45 U/L (ref 39–117)
BUN: 13 mg/dL (ref 6–23)
CO2: 26 meq/L (ref 19–32)
Calcium: 9.3 mg/dL (ref 8.4–10.5)
Chloride: 103 meq/L (ref 96–112)
Creatinine, Ser: 0.74 mg/dL (ref 0.40–1.20)
GFR: 76.68 mL/min (ref >60.00)
Glucose, Bld: 130 mg/dL — ABNORMAL HIGH (ref 70–99)
Potassium: 4.3 meq/L (ref 3.5–5.1)
Sodium: 137 meq/L (ref 135–145)
Total Bilirubin: 0.7 mg/dL (ref 0.2–1.2)
Total Protein: 6.8 g/dL (ref 6.0–8.3)

## 2023-06-22 LAB — VITAMIN D 25 HYDROXY (VIT D DEFICIENCY, FRACTURES): VITD: 58.42 ng/mL (ref 30.00–100.00)

## 2023-06-22 LAB — TSH: TSH: 0.56 u[IU]/mL (ref 0.35–5.50)

## 2023-06-22 LAB — HEMOGLOBIN A1C: Hgb A1c MFr Bld: 6 % (ref 4.6–6.5)

## 2023-06-22 MED ORDER — DIAZEPAM 2 MG PO TABS
2.0000 mg | ORAL_TABLET | Freq: Two times a day (BID) | ORAL | 2 refills | Status: AC | PRN
Start: 2023-06-22 — End: ?

## 2023-06-22 MED ORDER — LORAZEPAM 0.5 MG PO TABS: 0.5000 mg | ORAL_TABLET | Freq: Every evening | ORAL | 1 refills | Status: AC | PRN

## 2023-06-22 NOTE — Progress Notes (Signed)
 Please call patient: lab results show an elevated WBC: this could be due to an infection in the intestines: please call her and if her abdominal symptoms persist: pain, fever, diarrhea ... Then I recommend starting an antibiotic for possible diverticulitis. Let me know please.  Her other labs results are all stable.

## 2023-06-22 NOTE — Progress Notes (Signed)
 Subjective  Chief Complaint  Patient presents with   Annual Exam    Pt here for Annual Exam and is currently fasting    Hypertension    HPI: Carly Myers is a 78 y.o. female who presents to [facility] today for a Female Wellness Visit. She also has the concerns and/or needs as listed above in the chief complaint. These will be addressed in addition to the Health Maintenance Visit.   Wellness Visit: annual visit with health maintenance review and exam  HM: screens are current. Reviewed chest CT for lung ca screen from 2023: interstitial changes, mild but no symptoms. + emphysematous changes. Declines vaccines Chronic disease f/u and/or acute problem visit: (deemed necessary to be done in addition to the wellness visit): Discussed the use of AI scribe software for clinical note transcription with the patient, who gave verbal consent to proceed.  History of Present Illness Carly Myers "Carly Myers" is a 78 year old female with atrial fibrillation and vertigo who presents with nausea and abdominal pain.  She has been experiencing nausea and abdominal pain since last night, which she suspects might be due to food poisoning after consuming a rich salmon DLT sandwich. The pain is severe enough to have kept her up all night, and she feels that vomiting might provide relief. She also mentions a slight occurrence of diarrhea but primarily feels constipated. No h/o diverticulitis. No fever or chills. No blood in stool  Her chronic vertigo symptoms have worsened today, which she attributes to her current gastrointestinal distress. She manages her vertigo with lorazepam  and Valium , taking Valium  only as needed. Valium  helps with dizziness, but she is cautious about taking it regularly due to potential drowsiness and her need to remain active. She uses lorazepam  primarily at night to aid sleep.  She has a history of atrial fibrillation, which was treated with an ablation. She  questions why atrial fibrillation is still noted in her records despite the procedure. She also mentions a past lung scan showing nodules and a 'ground glass appearance' but has not had a follow-up scan in many years. She expresses concern about her back pain, which has persisted for two years, and wonders if she should consult a specialist.  She has a history of smoking but quit over fifteen years ago.  Bp and cholesterol meds are well tolerated. Due for lipid recheck.  H/o prediabetes w/o sxs of hyperglycemia. Reviewed dexa: osteopenia; recheck 2026.    Assessment  1. Essential hypertension   2. Cerebral vascular disease   3. Mixed hyperlipidemia   4. PAF (paroxysmal atrial fibrillation) (HCC)   5. Prediabetes   6. Peripheral vertigo of both ears   7. Osteopenia, unspecified location   8. Senile purpura (HCC)   9. Presence of Watchman left atrial appendage closure device   10. Vitamin D  deficiency, unspecified      Plan  Female Wellness Visit: Age appropriate Health Maintenance and Prevention measures were discussed with patient. Included topics are cancer screening recommendations, ways to keep healthy (see AVS) including dietary and exercise recommendations, regular eye and dental care, use of seat belts, and avoidance of moderate alcohol use and tobacco use.  BMI: discussed patient's BMI and encouraged positive lifestyle modifications to help get to or maintain a target BMI. HM needs and immunizations were addressed and ordered. See below for orders. See HM and immunization section for updates. Routine labs and screening tests ordered including cmp, cbc and lipids where appropriate. Discussed  recommendations regarding Vit D and calcium  supplementation (see AVS)  Chronic disease management visit and/or acute problem visit: Assessment and Plan Assessment & Plan Nausea and possible food poisoning vs other.  Likely secondary to food poisoning. Symptoms expected to resolve with  supportive care. - Advise Pepto-Bismol for symptomatic relief. - Encourage maintaining hydration. - Report if symptoms do not improve in 2-3 days. Consider diverticulitis/colitis etc. Benign exam today  Vertigo Chronic vertigo exacerbated by nausea. Managed with lorazepam  and Valium  as needed. Cautious about medication use due to side effects. - Refill lorazepam  prescription. - Refill Valium  prescription at 2.5 mg. - Advise Valium  as needed for dizziness, but not to combine with lorazepam .  Atrial fibrillation, post-ablation Treated with ablation. No current symptoms. She questions continued listing in records.  Lung nodules Previous ground glass appearance changes. No recent imaging or follow-up documented. History of smoking, quit over 15 years ago. - stable over time w/o sxs. No further imaging warranted.   HTN is cotnrolled on metoprolol  HLD on crestor : recheck fasting lipids with lft Recheck A1c and Tsh and Vit D   Follow up: 12 mo for cpe  Orders Placed This Encounter  Procedures   CBC with Differential/Platelet   Comprehensive metabolic panel with GFR   Lipid panel   Hemoglobin A1c   TSH   VITAMIN D  25 Hydroxy (Vit-D Deficiency, Fractures)   No orders of the defined types were placed in this encounter.     Body mass index is 20.24 kg/m. Wt Readings from Last 3 Encounters:  06/22/23 125 lb 6.4 oz (56.9 kg)  01/06/23 124 lb 6.4 oz (56.4 kg)  11/12/22 122 lb 6.4 oz (55.5 kg)     Patient Active Problem List   Diagnosis Date Noted Date Diagnosed   Prediabetes 06/18/2022     Priority: High    A1c 6.0 4/24    Vestibular vertigo 10/26/2020     Priority: High    Dr. Gracie Lav and Dr. Darlin Ehrlich [phone]    Cerebral vascular disease 10/26/2020     Priority: High    Brain MRI: small vessel ischemic changes with small remote infarct left cerebellum 2022    Essential hypertension 12/28/2019     Priority: High   PAF (paroxysmal atrial fibrillation) (HCC) 12/28/2019      Priority: High    s/p AF ablation  and subsequent Watchman 05/15/22.     Mixed hyperlipidemia 03/25/2017     Priority: High   Osteopenia 03/25/2017     Priority: Medium     dexa 2022 [facility]: lowest T = -1.8, recheck 3 years Failed fosamax in past Dexa 11/2022: lowest T Lspine = -2.0, osteopenia. Continue ca and d and recheck 2 years.     Positive colorectal cancer screening using Cologuard test with normal colonoscopy 6/23 11/21/2021     Priority: Low    No further screening indicated.    Sensorineural hearing loss (SNHL) of both ears 05/08/2021     Priority: Low    Dr. Darlin Ehrlich 2022    Vitamin deficiency 03/25/2017     Priority: Low   Senile purpura (HCC) 03/25/2017     Priority: Low   Allergic rhinitis 03/25/2017     Priority: Low   Presence of Watchman left atrial appendage closure device 05/15/2022     Watchman FLX 20mm placed by Dr. Marven Slimmer     Atrial fibrillation [facility]) 05/15/2022    Health Maintenance  Topic Date Due   Medicare Annual Wellness (AWV)  Never done  COVID-19 Vaccine (3 - 2024-25 season) 07/08/2023 (Originally 10/26/2022)   INFLUENZA VACCINE  09/25/2023   MAMMOGRAM  12/11/2023   DEXA SCAN  12/10/2024   Colonoscopy  07/26/2031   Pneumonia Vaccine 64+ Years old  Completed   Hepatitis C Screening  Completed   HPV VACCINES  Aged Out   Meningococcal B Vaccine  Aged Out   DTaP/Tdap/Td  Discontinued   Zoster Vaccines- Shingrix  Discontinued   Immunization History  Administered Date(s) Administered   PFIZER(Purple Top)SARS-COV-2 Vaccination 04/01/2019, 12/01/2019   PNEUMOCOCCAL CONJUGATE-20 05/08/2021   We updated and reviewed the patient's past history in detail and it is documented below. Allergies: Patient is allergic to bee venom. Past Medical History Patient  has a past medical history of Allergic rhinitis, Anaphylactic reaction to bee sting, Anxiety state, Arthritis, Disorder of bone, Essential hypertension (12/28/2019), Hypercholesteremia, Hyperlipemia,  Long term (current) use of anticoagulants (12/28/2019), Low back pain, Major depression, Osteopenia, Osteoporosis, PAF (paroxysmal atrial fibrillation) (HCC) (12/28/2019), Post-menopausal, Presence of Watchman left atrial appendage closure device (05/15/2022), Senile purpura (HCC), Sensorineural hearing loss (SNHL) of both ears (05/08/2021), Shingles, Sinusitis, UTI (urinary tract infection), and Vitamin D  deficiency. Past Surgical History Patient  has a past surgical history that includes No past surgeries; ATRIAL FIBRILLATION ABLATION (N/A, 01/23/2022); LEFT ATRIAL APPENDAGE OCCLUSION (N/A, 05/15/2022); and TEE without cardioversion (N/A, 05/15/2022). Family History: Patient family history is not on file. Social History:  Patient  reports that she quit smoking about 15 years ago. Her smoking use included cigarettes. She has never used smokeless tobacco. She reports current alcohol use. She reports that she does not use drugs.  Review of Systems: Constitutional: negative for fever or malaise Ophthalmic: negative for photophobia, double vision or loss of vision Cardiovascular: negative for chest pain, dyspnea on exertion, or new LE swelling Respiratory: negative for SOB or persistent cough Gastrointestinal: negative for abdominal pain, change in bowel habits or melena Genitourinary: negative for dysuria or gross hematuria, no abnormal uterine bleeding or disharge Musculoskeletal: negative for new gait disturbance or muscular weakness Integumentary: negative for new or persistent rashes, no breast lumps Neurological: negative for TIA or stroke symptoms Psychiatric: negative for SI or delusions Allergic/Immunologic: negative for hives  Patient Care Team    Relationship Specialty Notifications Start End  Luevenia Saha, MD PCP - General Family Medicine  12/26/19   Elmyra Haggard, MD PCP - Cardiology Cardiology Admissions 03/29/18   Boyce Byes, MD PCP - Electrophysiology Cardiology  08/08/21    [place], Amy, MD Consulting Physician Dermatology  12/28/19   Myrle Aspen, [facility] (Inactive) Pharmacist Pharmacist  02/05/21    Comment: [phone]    Objective  Vitals: BP 116/62   Pulse 65   Temp 98 F (36.7 C)   Ht 5\' 6"  (1.676 m)   Wt 125 lb 6.4 oz (56.9 kg)   SpO2 98%   BMI 20.24 kg/m  General:  Well developed, well nourished, no acute distress , non toxic appearing Psych:  Alert and orientedx3,normal mood and affect HEENT:  Normocephalic, atraumatic, non-icteric sclera,  supple neck without adenopathy, mass or thyromegaly Cardiovascular:  Normal S1, S2, RRR without gallop, rub or murmur Respiratory:  Good breath sounds bilaterally, CTAB with normal respiratory effort Gastrointestinal: normal bowel sounds, soft, mild ttp LLQ w/o rebound or guarding, no noted masses. No HSM MSK: extremities without edema, joints without erythema or swelling Neurologic:    Mental status is normal.  Gross motor and sensory exams are normal.  No tremor  Commons side effects, risks, benefits, and alternatives for medications and treatment plan prescribed today were discussed, and the patient expressed understanding of the given instructions. Patient is instructed to call or message via MyChart if he/she has any questions or concerns regarding our treatment plan. No barriers to understanding were identified. We discussed Red Flag symptoms and signs in detail. Patient expressed understanding regarding what to do in case of urgent or emergency type symptoms.  Medication list was reconciled, printed and provided to the patient in AVS. Patient instructions and summary information was reviewed with the patient as documented in the AVS. This note was prepared with assistance of Dragon voice recognition software. Occasional wrong-word or sound-a-like substitutions may have occurred due to the inherent limitations of voice recognition software

## 2023-06-22 NOTE — Patient Instructions (Signed)
 Please return in 12 months for your annual complete physical; please come fasting.   I will release your lab results to you on your MyChart account with further instructions. You may see the results before I do, but when I review them I will send you a message with my report or have my assistant call you if things need to be discussed. Please reply to my message with any questions. Thank you!   If you have any questions or concerns, please don't hesitate to send me a message via MyChart or call the office at [phone]. Thank you for visiting with us  today! It's our pleasure caring for you.    VISIT SUMMARY: Today, you came in with nausea and abdominal pain, which you suspect might be due to food poisoning. You also mentioned that your vertigo symptoms have worsened, and you have concerns about your atrial fibrillation, lung nodules, and back pain. We discussed your current medications and the need for a follow-up scan for your lung nodules.  YOUR PLAN: -NAUSEA AND POSSIBLE FOOD POISONING: Food poisoning occurs when you consume contaminated food, leading to symptoms like nausea, vomiting, and abdominal pain. We recommend taking Pepto-Bismol for relief and staying hydrated. If your symptoms do not improve in 2-3 days, please let us  know.  -VERTIGO: Vertigo is a sensation of spinning or dizziness. Your vertigo is managed with lorazepam  and Valium  as needed. We have refilled your prescriptions and advise you to use Valium  only when necessary and not to combine it with lorazepam .  -ATRIAL FIBRILLATION, POST-ABLATION: Atrial fibrillation is an irregular heart rhythm. You have had an ablation procedure to treat it, and you currently have no symptoms. We will review your records to ensure they are up to date.  -LUNG CT scan: I reviewed your reports. Given that you have no symptoms, and had recent chest xray (2024), we do not need to repeat a chest CT at this time.     INSTRUCTIONS: Please follow up  with us  if your nausea and abdominal pain do not improve in 2-3 days. We have ordered a follow-up CT scan to evaluate your lung nodules. Continue taking your medications as prescribed, and let us  know if you have any concerns or if your symptoms change.                      Contains text generated by Abridge.                                 Contains text generated by Abridge.

## 2023-06-23 ENCOUNTER — Encounter: Payer: Self-pay | Admitting: Family

## 2023-06-23 ENCOUNTER — Ambulatory Visit (INDEPENDENT_AMBULATORY_CARE_PROVIDER_SITE_OTHER): Admitting: Family

## 2023-06-23 VITALS — BP 113/71 | HR 61 | Temp 97.7°F | Ht 66.0 in | Wt 125.0 lb

## 2023-06-23 DIAGNOSIS — K5792 Diverticulitis of intestine, part unspecified, without perforation or abscess without bleeding: Secondary | ICD-10-CM | POA: Diagnosis not present

## 2023-06-23 MED ORDER — AMOXICILLIN-POT CLAVULANATE 875-125 MG PO TABS
1.0000 | ORAL_TABLET | Freq: Two times a day (BID) | ORAL | 0 refills | Status: DC
Start: 2023-06-23 — End: 2023-06-26

## 2023-06-23 NOTE — Patient Instructions (Signed)
 It was very nice to see you today!   You have Diverticulitis, inflammation in your bowel. I have sent over an antibiotic to start today. Eat first, wait 20 minutes, then take the medicine twice a day. Take the Restora probiotic I gave you, 1 pill daily to also help your symptoms. Look for liquid magnesium citrate over the counter, start with just 1/4 of the bottle for first day, if no BM, then drink to 1/2 bottle, continue daily until empty or you have a good, soft BM. Ok to continue the Tylenol  as needed for pain or fever. Call the office next week if still not feeling better.      PLEASE NOTE:  If you had any lab tests please let us  know if you have not heard back within a few days. You may see your results on MyChart before we have a chance to review them but we will give you a call once they are reviewed by us . If we ordered any referrals today, please let us  know if you have not heard from their office within the next week.

## 2023-06-23 NOTE — Progress Notes (Signed)
 Patient ID: Carly Myers, female    DOB: [birth year], 79 y.o.   MRN: [id number]  Chief Complaint  Patient presents with   Abdominal Pain    Pt c/o abdominal pain, bloating and fever. Present since yesterday. Has tried tylenol  which does not help but ibuprofen does help sx.   Discussed the use of AI scribe software for clinical note transcription with the patient, who gave verbal consent to proceed.  History of Present Illness Carly Myers "Carly Myers" is a 78 year old female who presents with fever, bloating, nausea, and constipation.  She has had a fever since yesterday, which she initially thought might be due to food poisoning after eating salmon, strawberries, and blueberries. She feels nauseous and experiences significant bloating. Constipation has persisted since Sunday night, which is unusual for her regular pattern of daily or every other day bowel movements. She has been taking Dulcolax daily without relief and uses stool softeners regularly. Miralax was previously ineffective. She reports no bladder issues or urinary symptoms. No history of diverticulitis or stomach infections. Past colonoscopy results were normal, and she was advised that no further colonoscopy was needed.  Assessment & Plan Diverticulitis - Leukocytosis w/neutrophilia on CBC yesterday likely d/t diverticulitis due to fever, mild nausea, bloating and constipation. Food poisoning less likely. - Prescribed Augmentin bid x 7days, advised to take after eating. - Recommended probiotic or kefir to prevent yeast infection. Samples provided for Restore probiotic x 4d. - Advised on hydration and bland diet, avoiding spicy, fried, and creamy foods, stick with liquids, broth today, and expand diet tomorrow as tolerated to softer foods. - Call next week if symptoms are not improved.  Constipation Constipation with abdominal bloating and cramping. Previous Dulcolax use ineffective. Uses Miralax prn. - Recommended starting OTC  magnesium citrate today, starting with a quarter bottle, if no BM then drink another 1/4 daily until a soft BM obtained. - Advised stool softeners and Miralax qd or every other day ongoing to ensure a daily soft BM. - Must drink up to 2 liters of water daily. - F/U with PCP prn   Subjective:    Outpatient Medications Prior to Visit  Medication Sig Dispense Refill   acetaminophen  (TYLENOL ) 500 MG tablet Take 500 mg by mouth every 6 (six) hours as needed for moderate pain.     COLLAGEN PO Take 1 tablet by mouth daily. With biotin and vitamin c     diazepam  (VALIUM ) 2 MG tablet Take 1 tablet (2 mg total) by mouth 2 (two) times daily as needed (vertigo). 60 tablet 2   docusate sodium (COLACE) 100 MG capsule Take 100 mg by mouth daily as needed for mild constipation.     EPINEPHrine 0.3 mg/0.3 mL IJ SOAJ injection Inject 0.3 mg into the muscle as needed for anaphylaxis.     fluticasone (FLONASE) 50 MCG/ACT nasal spray Place 1 spray into both nostrils daily as needed for allergies or rhinitis.     Homeopathic Products (RESTFUL LEGS PM SL) Place 1 tablet under the tongue at bedtime.     LORazepam  (ATIVAN ) 0.5 MG tablet Take 1 tablet (0.5 mg total) by mouth at bedtime as needed for sleep. 90 tablet 1   metoprolol  succinate (TOPROL -XL) 25 MG 24 hr tablet TAKE 1 TABLET IN THE MORNING AND AT BEDTIME 180 tablet 3   Multiple Vitamin (MULTIVITAMIN) tablet Take 1 tablet by mouth daily.     OVER THE COUNTER MEDICATION Apply 1 Application topically at bedtime. Hemp cream  rosuvastatin  (CRESTOR ) 40 MG tablet TAKE ONE-HALF (1/2) TABLET (20 MG) ON SUNDAY, TUESDAY, THURSDAY, AND SATURDAY. TAKE 1 TABLET (40 MG) ON MONDAY, WEDNESDAY, AND FRIDAY 65 tablet 3   No facility-administered medications prior to visit.   Past Medical History:  Diagnosis Date   Allergic rhinitis    Anaphylactic reaction to bee sting    Anxiety state    unspecified   Arthritis    Ortho Dr. Murrell Arrant   Disorder of bone    unspecified    Essential hypertension 12/28/2019   Hypercholesteremia    migrated   Hyperlipemia    Long term (current) use of anticoagulants 12/28/2019   PAF   Low back pain    Major depression    in complete remission   Osteopenia    Osteoporosis    PAF (paroxysmal atrial fibrillation) (HCC) 12/28/2019   Post-menopausal    Presence of Watchman left atrial appendage closure device 05/15/2022   Watchman FLX 20mm placed by Dr. Marven Slimmer   Senile purpura [facility])    Sensorineural hearing loss (SNHL) of both ears 05/08/2021   Dr. Darlin Ehrlich 2022   Shingles    Sinusitis    UTI (urinary tract infection)    Vitamin D  deficiency    Past Surgical History:  Procedure Laterality Date   ATRIAL FIBRILLATION ABLATION N/A 01/23/2022   Procedure: ATRIAL FIBRILLATION ABLATION;  Surgeon: Boyce Byes, MD;  Location: [facility] INVASIVE CV LAB;  Service: Cardiovascular;  Laterality: N/A;   LEFT ATRIAL APPENDAGE OCCLUSION N/A 05/15/2022   Procedure: LEFT ATRIAL APPENDAGE OCCLUSION;  Surgeon: Boyce Byes, MD;  Location: [facility] INVASIVE CV LAB;  Service: Cardiovascular;  Laterality: N/A;   NO PAST SURGERIES     TEE WITHOUT CARDIOVERSION N/A 05/15/2022   Procedure: TRANSESOPHAGEAL ECHOCARDIOGRAM (TEE);  Surgeon: Boyce Byes, MD;  Location: [facility] INVASIVE CV LAB;  Service: Cardiovascular;  Laterality: N/A;   Allergies  Allergen Reactions   Bee Venom Swelling      Objective:    Physical Exam Vitals and nursing note reviewed.  Constitutional:      Appearance: Normal appearance.  Cardiovascular:     Rate and Rhythm: Normal rate and regular rhythm.  Pulmonary:     Effort: Pulmonary effort is normal.     Breath sounds: Normal breath sounds.  Abdominal:     General: There is distension.     Tenderness: There is generalized abdominal tenderness.  Musculoskeletal:        General: Normal range of motion.  Skin:    General: Skin is warm and dry.  Neurological:     Mental Status: She is alert.  Psychiatric:         Mood and Affect: Mood normal.        Behavior: Behavior normal.    BP 113/71 (BP Location: Left Arm, Patient Position: Sitting, Cuff Size: Large)   Pulse 61   Temp 97.7 F (36.5 C) (Temporal)   Ht 5\' 6"  (1.676 m)   Wt 125 lb (56.7 kg)   SpO2 98%   BMI 20.18 kg/m  Wt Readings from Last 3 Encounters:  06/23/23 125 lb (56.7 kg)  06/22/23 125 lb 6.4 oz (56.9 kg)  01/06/23 124 lb 6.4 oz (56.4 kg)      Versa Gore, NP

## 2023-06-24 ENCOUNTER — Encounter (HOSPITAL_BASED_OUTPATIENT_CLINIC_OR_DEPARTMENT_OTHER): Payer: Self-pay | Admitting: Emergency Medicine

## 2023-06-24 ENCOUNTER — Observation Stay (HOSPITAL_BASED_OUTPATIENT_CLINIC_OR_DEPARTMENT_OTHER)
Admission: EM | Admit: 2023-06-24 | Discharge: 2023-06-26 | Disposition: A | Discharge status: Home / Self Care | Attending: Emergency Medicine | Admitting: Emergency Medicine

## 2023-06-24 ENCOUNTER — Other Ambulatory Visit: Payer: Self-pay

## 2023-06-24 ENCOUNTER — Observation Stay (HOSPITAL_COMMUNITY): Admitting: Anesthesiology

## 2023-06-24 ENCOUNTER — Encounter (HOSPITAL_COMMUNITY)
Admission: EM | Disposition: A | Discharge status: Home / Self Care | Payer: Self-pay | Source: Home / Self Care | Attending: Emergency Medicine

## 2023-06-24 ENCOUNTER — Observation Stay (HOSPITAL_BASED_OUTPATIENT_CLINIC_OR_DEPARTMENT_OTHER): Admitting: Anesthesiology

## 2023-06-24 ENCOUNTER — Ambulatory Visit: Payer: Self-pay

## 2023-06-24 ENCOUNTER — Emergency Department (HOSPITAL_BASED_OUTPATIENT_CLINIC_OR_DEPARTMENT_OTHER)

## 2023-06-24 DIAGNOSIS — K3580 Unspecified acute appendicitis: Secondary | ICD-10-CM

## 2023-06-24 DIAGNOSIS — I10 Essential (primary) hypertension: Secondary | ICD-10-CM | POA: Diagnosis not present

## 2023-06-24 DIAGNOSIS — I480 Paroxysmal atrial fibrillation: Secondary | ICD-10-CM | POA: Diagnosis not present

## 2023-06-24 DIAGNOSIS — K429 Umbilical hernia without obstruction or gangrene: Secondary | ICD-10-CM | POA: Diagnosis not present

## 2023-06-24 DIAGNOSIS — K352 Acute appendicitis with generalized peritonitis, without perforation or abscess: Secondary | ICD-10-CM | POA: Diagnosis not present

## 2023-06-24 DIAGNOSIS — R109 Unspecified abdominal pain: Secondary | ICD-10-CM | POA: Diagnosis not present

## 2023-06-24 DIAGNOSIS — Z79899 Other long term (current) drug therapy: Secondary | ICD-10-CM | POA: Diagnosis not present

## 2023-06-24 DIAGNOSIS — R1011 Right upper quadrant pain: Secondary | ICD-10-CM | POA: Diagnosis present

## 2023-06-24 DIAGNOSIS — K5730 Diverticulosis of large intestine without perforation or abscess without bleeding: Secondary | ICD-10-CM | POA: Diagnosis not present

## 2023-06-24 DIAGNOSIS — K353 Acute appendicitis with localized peritonitis, without perforation or gangrene: Secondary | ICD-10-CM | POA: Diagnosis not present

## 2023-06-24 DIAGNOSIS — Z87891 Personal history of nicotine dependence: Secondary | ICD-10-CM | POA: Diagnosis not present

## 2023-06-24 DIAGNOSIS — K3589 Other acute appendicitis without perforation or gangrene: Secondary | ICD-10-CM | POA: Diagnosis not present

## 2023-06-24 HISTORY — PX: LAPAROSCOPIC APPENDECTOMY: SHX408

## 2023-06-24 LAB — COMPREHENSIVE METABOLIC PANEL WITH GFR
ALT: 17 U/L (ref 0–44)
AST: 22 U/L (ref 15–41)
Albumin: 4 g/dL (ref 3.5–5.0)
Alkaline Phosphatase: 64 U/L (ref 38–126)
Anion gap: 12 (ref 5–15)
BUN: 12 mg/dL (ref 8–23)
CO2: 22 mmol/L (ref 22–32)
Calcium: 9.5 mg/dL (ref 8.9–10.3)
Chloride: 100 mmol/L (ref 98–111)
Creatinine, Ser: 0.86 mg/dL (ref 0.44–1.00)
GFR, Estimated: >60 mL/min (ref >60)
Glucose, Bld: 148 mg/dL — ABNORMAL HIGH (ref 70–99)
Potassium: 4.2 mmol/L (ref 3.5–5.1)
Sodium: 134 mmol/L — ABNORMAL LOW (ref 135–145)
Total Bilirubin: 0.8 mg/dL (ref 0.0–1.2)
Total Protein: 6.8 g/dL (ref 6.5–8.1)

## 2023-06-24 LAB — URINALYSIS, ROUTINE W REFLEX MICROSCOPIC
Bacteria, UA: NONE SEEN
Bilirubin Urine: NEGATIVE
Glucose, UA: NEGATIVE mg/dL
Ketones, ur: NEGATIVE mg/dL
Leukocytes,Ua: NEGATIVE
Nitrite: NEGATIVE
Protein, ur: 30 mg/dL — AB
Specific Gravity, Urine: >1.046 — ABNORMAL HIGH (ref 1.005–1.030)
pH: 8 (ref 5.0–8.0)

## 2023-06-24 LAB — CBC
HCT: 37.5 % (ref 36.0–46.0)
Hemoglobin: 12.4 g/dL (ref 12.0–15.0)
MCH: 32 pg (ref 26.0–34.0)
MCHC: 33.1 g/dL (ref 30.0–36.0)
MCV: 96.6 fL (ref 80.0–100.0)
Platelets: 310 10*3/uL (ref 150–400)
RBC: 3.88 MIL/uL (ref 3.87–5.11)
RDW: 14.2 % (ref 11.5–15.5)
WBC: 17.3 10*3/uL — ABNORMAL HIGH (ref 4.0–10.5)
nRBC: 0 % (ref 0.0–0.2)

## 2023-06-24 LAB — LIPASE, BLOOD: Lipase: 14 U/L (ref 11–51)

## 2023-06-24 LAB — OCCULT BLOOD X 1 CARD TO LAB, STOOL: Fecal Occult Bld: NEGATIVE

## 2023-06-24 SURGERY — APPENDECTOMY, LAPAROSCOPIC
Anesthesia: General | Site: Abdomen

## 2023-06-24 MED ORDER — ROCURONIUM BROMIDE 10 MG/ML (PF) SYRINGE
PREFILLED_SYRINGE | INTRAVENOUS | Status: DC | PRN
  Administered 2023-06-24: 60 mg via INTRAVENOUS

## 2023-06-24 MED ORDER — PIPERACILLIN-TAZOBACTAM 3.375 G IVPB: 3.3750 g | Freq: Three times a day (TID) | INTRAVENOUS | Status: DC

## 2023-06-24 MED ORDER — ACETAMINOPHEN 10 MG/ML IV SOLN
INTRAVENOUS | Status: DC | PRN
  Administered 2023-06-24: 1000 mg via INTRAVENOUS

## 2023-06-24 MED ORDER — LIDOCAINE 2% (20 MG/ML) 5 ML SYRINGE
INTRAMUSCULAR | Status: DC | PRN
  Administered 2023-06-24: 60 mg via INTRAVENOUS

## 2023-06-24 MED ORDER — FENTANYL CITRATE (PF) 250 MCG/5ML IJ SOLN
INTRAMUSCULAR | Status: DC | PRN
  Administered 2023-06-24: 50 ug via INTRAVENOUS
  Administered 2023-06-24 (×2): 25 ug via INTRAVENOUS

## 2023-06-24 MED ORDER — SUGAMMADEX SODIUM 200 MG/2ML IV SOLN
INTRAVENOUS | Status: DC | PRN
  Administered 2023-06-24 (×2): 100 mg via INTRAVENOUS

## 2023-06-24 MED ORDER — LACTATED RINGERS IV SOLN: INTRAVENOUS | Status: DC | PRN

## 2023-06-24 MED ORDER — ONDANSETRON HCL 4 MG/2ML IJ SOLN
4.0000 mg | Freq: Once | INTRAMUSCULAR | Status: AC
Start: 2023-06-24 — End: 2023-06-24
  Administered 2023-06-24: 4 mg via INTRAVENOUS
  Filled 2023-06-24: qty 2

## 2023-06-24 MED ORDER — PIPERACILLIN-TAZOBACTAM 3.375 G IVPB 30 MIN
3.3750 g | Freq: Once | INTRAVENOUS | Status: AC
  Administered 2023-06-24: 3.375 g via INTRAVENOUS
  Filled 2023-06-24: qty 50

## 2023-06-24 MED ORDER — BUPIVACAINE-EPINEPHRINE (PF) 0.25% -1:200000 IJ SOLN
INTRAMUSCULAR | Status: AC
  Filled 2023-06-24: qty 30

## 2023-06-24 MED ORDER — ACETAMINOPHEN 10 MG/ML IV SOLN
INTRAVENOUS | Status: AC
  Filled 2023-06-24: qty 100

## 2023-06-24 MED ORDER — BUPIVACAINE-EPINEPHRINE 0.25% -1:200000 IJ SOLN
INTRAMUSCULAR | Status: DC | PRN
  Administered 2023-06-24: 20 mL

## 2023-06-24 MED ORDER — ACETAMINOPHEN 10 MG/ML IV SOLN: 1000.0000 mg | Freq: Once | INTRAVENOUS | Status: DC | PRN

## 2023-06-24 MED ORDER — DICYCLOMINE HCL 10 MG/ML IM SOLN
20.0000 mg | Freq: Once | INTRAMUSCULAR | Status: AC
  Administered 2023-06-24: 20 mg via INTRAMUSCULAR
  Filled 2023-06-24: qty 2

## 2023-06-24 MED ORDER — FENTANYL CITRATE (PF) 100 MCG/2ML IJ SOLN
INTRAMUSCULAR | Status: AC
  Filled 2023-06-24: qty 2

## 2023-06-24 MED ORDER — OXYCODONE HCL 5 MG PO TABS: 5.0000 mg | ORAL_TABLET | Freq: Once | ORAL | Status: DC | PRN

## 2023-06-24 MED ORDER — IOHEXOL 300 MG/ML  SOLN
100.0000 mL | Freq: Once | INTRAMUSCULAR | Status: AC | PRN
  Administered 2023-06-24: 100 mL via INTRAVENOUS

## 2023-06-24 MED ORDER — SODIUM CHLORIDE 0.9 % IV BOLUS
500.0000 mL | Freq: Once | INTRAVENOUS | Status: AC
  Administered 2023-06-24: 500 mL via INTRAVENOUS

## 2023-06-24 MED ORDER — FENTANYL CITRATE PF 50 MCG/ML IJ SOSY
50.0000 ug | PREFILLED_SYRINGE | Freq: Once | INTRAMUSCULAR | Status: AC
  Administered 2023-06-24: 50 ug via INTRAVENOUS
  Filled 2023-06-24: qty 1

## 2023-06-24 MED ORDER — PROPOFOL 10 MG/ML IV BOLUS
INTRAVENOUS | Status: AC
  Filled 2023-06-24: qty 20

## 2023-06-24 MED ORDER — DEXAMETHASONE SODIUM PHOSPHATE 10 MG/ML IJ SOLN
INTRAMUSCULAR | Status: DC | PRN
  Administered 2023-06-24: 5 mg via INTRAVENOUS

## 2023-06-24 MED ORDER — LACTATED RINGERS IR SOLN
Status: DC | PRN
  Administered 2023-06-24: 1000 mL

## 2023-06-24 MED ORDER — OXYCODONE HCL 5 MG/5ML PO SOLN: 5.0000 mg | Freq: Once | ORAL | Status: DC | PRN

## 2023-06-24 MED ORDER — FENTANYL CITRATE PF 50 MCG/ML IJ SOSY: 25.0000 ug | PREFILLED_SYRINGE | INTRAMUSCULAR | Status: DC | PRN

## 2023-06-24 MED ORDER — PROPOFOL 10 MG/ML IV BOLUS
INTRAVENOUS | Status: DC | PRN
  Administered 2023-06-24: 110 mg via INTRAVENOUS

## 2023-06-24 MED ORDER — PIPERACILLIN-TAZOBACTAM 3.375 G IVPB 30 MIN
3.3750 g | Freq: Once | INTRAVENOUS | Status: DC
Start: 2023-06-24 — End: 2023-06-24

## 2023-06-24 MED ORDER — 0.9 % SODIUM CHLORIDE (POUR BTL) OPTIME
TOPICAL | Status: DC | PRN
  Administered 2023-06-24: 1000 mL

## 2023-06-24 MED ORDER — ONDANSETRON HCL 4 MG/2ML IJ SOLN
INTRAMUSCULAR | Status: DC | PRN
  Administered 2023-06-24: 4 mg via INTRAVENOUS

## 2023-06-24 SURGICAL SUPPLY — 39 items
BAG COUNTER SPONGE SURGICOUNT (BAG) IMPLANT
CABLE HIGH FREQUENCY MONO STRZ (ELECTRODE) IMPLANT
CATH FOLEY 2WAY SLVR 5CC 12FR (CATHETERS) IMPLANT
CHLORAPREP W/TINT 26 (MISCELLANEOUS) ×2 IMPLANT
CLIP APPLIE 5 13 M/L LIGAMAX5 (MISCELLANEOUS) IMPLANT
CLIP APPLIE ROT 10 11.4 M/L (STAPLE) IMPLANT
COVER SURGICAL LIGHT HANDLE (MISCELLANEOUS) ×2 IMPLANT
CUTTER FLEX LINEAR 45M (STAPLE) IMPLANT
DERMABOND ADVANCED .7 DNX12 (GAUZE/BANDAGES/DRESSINGS) ×2 IMPLANT
ELECT PENCIL ROCKER SW 15FT (MISCELLANEOUS) IMPLANT
ELECT REM PT RETURN 15FT ADLT (MISCELLANEOUS) ×2 IMPLANT
ENDOLOOP SUT PDS II 0 18 (SUTURE) IMPLANT
GLOVE BIO SURGEON STRL SZ7 (GLOVE) IMPLANT
GLOVE BIO SURGEON STRL SZ7.5 (GLOVE) ×2 IMPLANT
GLOVE BIO SURGEON STRL SZ8 (GLOVE) IMPLANT
GLOVE BIOGEL PI IND STRL 7.0 (GLOVE) IMPLANT
GLOVE BIOGEL PI IND STRL 7.5 (GLOVE) IMPLANT
GLOVE BIOGEL PI IND STRL 8 (GLOVE) IMPLANT
GOWN STRL REUS W/ TWL LRG LVL3 (GOWN DISPOSABLE) IMPLANT
GOWN STRL REUS W/TWL XL LVL3 (GOWN DISPOSABLE) IMPLANT
IRRIGATION SUCT STRKRFLW 2 WTP (MISCELLANEOUS) ×2 IMPLANT
KIT BASIN OR (CUSTOM PROCEDURE TRAY) ×2 IMPLANT
KIT TURNOVER KIT A (KITS) IMPLANT
RELOAD 45 THICK GREEN (ENDOMECHANICALS) ×2 IMPLANT
RELOAD STAPLE 45 3.5 BLU ETS (ENDOMECHANICALS) IMPLANT
RELOAD STAPLE 45 GRN THCK ETS (ENDOMECHANICALS) IMPLANT
RELOAD STAPLE TA45 3.5 REG BLU (ENDOMECHANICALS) ×1 IMPLANT
SCISSORS LAP 5X35 DISP (ENDOMECHANICALS) IMPLANT
SET TUBE SMOKE EVAC HIGH FLOW (TUBING) ×2 IMPLANT
SHEARS HARMONIC 36 ACE (MISCELLANEOUS) ×2 IMPLANT
SLEEVE Z-THREAD 5X100MM (TROCAR) ×2 IMPLANT
SPIKE FLUID TRANSFER (MISCELLANEOUS) ×2 IMPLANT
SUT MNCRL AB 4-0 PS2 18 (SUTURE) ×2 IMPLANT
SYSTEM BAG RETRIEVAL 10MM (BASKET) ×2 IMPLANT
TOWEL OR 17X26 10 PK STRL BLUE (TOWEL DISPOSABLE) ×2 IMPLANT
TRAY FOLEY MTR SLVR 14FR STAT (SET/KITS/TRAYS/PACK) IMPLANT
TRAY LAPAROSCOPIC (CUSTOM PROCEDURE TRAY) ×2 IMPLANT
TROCAR BALLN 12MMX100 BLUNT (TROCAR) ×2 IMPLANT
TROCAR Z-THREAD OPTICAL 5X100M (TROCAR) ×2 IMPLANT

## 2023-06-24 NOTE — ED Provider Notes (Signed)
 [facility] EMERGENCY DEPARTMENT AT [facility] Provider Note   CSN: [id number] Arrival date & time: 06/24/23  1257     History  Chief Complaint  Patient presents with   Abdominal Pain    Carly Myers is a 78 y.o. female with history of hypertension, paroxysmal A-fib with Watchman placed and not on any anticoagulation, presents with 4 days of generalized abdominal pain.  States the pain is fairly constant.  She reports some dry heaving but no episodes of vomiting. Reports decreased appetite. Reports she is having trouble with constipation, but did have a bowel movement yesterday after taking magnesium citrate.  Her bowel movement was black and runny.  Denies any dysuria, hematuria, increased frequency, fever or chills.  Reports she was seen by her PCP and started on Augmentin  for possible diverticulitis.  She has taken 2 days of the antibiotic without improvement in symptoms.  Last PO intake at 12pm today.   Abdominal Pain      Home Medications Prior to Admission medications   Medication Sig Start Date End Date Taking? Authorizing Provider  acetaminophen  (TYLENOL ) 500 MG tablet Take 500 mg by mouth every 6 (six) hours as needed for moderate pain.    [provider]  amoxicillin -clavulanate (AUGMENTIN ) 875-125 MG tablet Take 1 tablet by mouth 2 (two) times daily after a meal for 7 days. Wait 20 minutes after eating to take. 06/23/23 06/30/23  Versa Gore, NP  COLLAGEN PO Take 1 tablet by mouth daily. With biotin and vitamin c    [provider]  diazepam  (VALIUM ) 2 MG tablet Take 1 tablet (2 mg total) by mouth 2 (two) times daily as needed (vertigo). 06/22/23   Luevenia Saha, MD  docusate sodium  (COLACE) 100 MG capsule Take 100 mg by mouth daily as needed for mild constipation.    [provider]  EPINEPHrine  0.3 mg/0.3 mL IJ SOAJ injection Inject 0.3 mg into the muscle as needed for anaphylaxis.    [provider]  fluticasone  (FLONASE) 50 MCG/ACT nasal spray Place 1 spray into both nostrils daily as needed for allergies or rhinitis.    [provider]  Homeopathic Products (RESTFUL LEGS PM SL) Place 1 tablet under the tongue at bedtime.    [provider]  LORazepam  (ATIVAN ) 0.5 MG tablet Take 1 tablet (0.5 mg total) by mouth at bedtime as needed for sleep. 06/22/23   Luevenia Saha, MD  metoprolol  succinate (TOPROL -XL) 25 MG 24 hr tablet TAKE 1 TABLET IN THE MORNING AND AT BEDTIME 11/28/22   Ross, Paula V, MD  Multiple Vitamin (MULTIVITAMIN) tablet Take 1 tablet by mouth daily.    [provider]  OVER THE COUNTER MEDICATION Apply 1 Application topically at bedtime. Hemp cream    [provider]  rosuvastatin  (CRESTOR ) 40 MG tablet TAKE ONE-HALF (1/2) TABLET (20 MG) ON SUNDAY, TUESDAY, THURSDAY, AND SATURDAY. TAKE 1 TABLET (40 MG) ON MONDAY, WEDNESDAY, AND FRIDAY 05/27/23   Elmyra Haggard, MD      Allergies    Bee venom    Review of Systems   Review of Systems  Gastrointestinal:  Positive for abdominal pain.    Physical Exam Updated Vital Signs BP 135/67   Pulse 92   Temp 99.2 F (37.3 C) (Oral)   Resp (!) 22   Ht 5\' 6"  (1.676 m)   Wt 56.7 kg   SpO2 94%   BMI 20.18 kg/m  Physical Exam Vitals and nursing note reviewed. Exam conducted  with a chaperone present.  Constitutional:      General: She is not in acute distress.    Appearance: She is well-developed.  HENT:     Head: Normocephalic and atraumatic.  Eyes:     Conjunctiva/sclera: Conjunctivae normal.  Cardiovascular:     Rate and Rhythm: Normal rate and regular rhythm.     Heart sounds: No murmur heard. Pulmonary:     Effort: Pulmonary effort is normal. No respiratory distress.     Breath sounds: Normal breath sounds.  Abdominal:     Palpations: Abdomen is soft.     Tenderness: There is abdominal tenderness.     Comments: Generalized tenderness to palpation, particularly in the upper abdomen.  Mild  guarding.  No rebound  Genitourinary:    Comments: RN Savannah present to chaperone rectal exam  No external hemorrhoids noted. Stool without any gross blood.  Slightly greenish-brown appearing Musculoskeletal:        General: No swelling.     Cervical back: Neck supple.  Skin:    General: Skin is warm and dry.     Capillary Refill: Capillary refill takes less than 2 seconds.  Neurological:     Mental Status: She is alert.  Psychiatric:        Mood and Affect: Mood normal.     ED Results / Procedures / Treatments   Labs (all labs ordered are listed, but only abnormal results are displayed) Labs Reviewed  COMPREHENSIVE METABOLIC PANEL WITH GFR - Abnormal; Notable for the following components:      Result Value   Sodium 134 (*)    Glucose, Bld 148 (*)    All other components within normal limits  CBC - Abnormal; Notable for the following components:   WBC 17.3 (*)    All other components within normal limits  URINALYSIS, ROUTINE W REFLEX MICROSCOPIC - Abnormal; Notable for the following components:   Specific Gravity, Urine >1.046 (*)    Hgb urine dipstick MODERATE (*)    Protein, ur 30 (*)    All other components within normal limits  LIPASE, BLOOD  OCCULT BLOOD X 1 CARD TO LAB, STOOL    EKG EKG Interpretation Date/Time:  Wednesday June 24 2023 13:20:46 EDT Ventricular Rate:  91 PR Interval:  153 QRS Duration:  92 QT Interval:  339 QTC Calculation: 417 R Axis:   -28  Text Interpretation: Sinus rhythm Borderline left axis deviation Anterior infarct, old Nonspecific T abnormalities, lateral leads Baseline wander in lead(s) V1 No significant change since last tracing Confirmed by Sueellen Emery [phone]) on 06/24/2023 1:39:13 PM  Radiology CT ABDOMEN PELVIS W CONTRAST Result Date: 06/24/2023 CLINICAL DATA:  Abdominal pain, acute, nonlocalized. EXAM: CT ABDOMEN AND PELVIS WITH CONTRAST TECHNIQUE: Multidetector CT imaging of the abdomen and pelvis was performed using  the standard protocol following bolus administration of intravenous contrast. RADIATION DOSE REDUCTION: This exam was performed according to the departmental dose-optimization program which includes automated exposure control, adjustment of the mA and/or kV according to patient size and/or use of iterative reconstruction technique. CONTRAST:  OMNIPAQUE  IOHEXOL  300 MG/ML  SOLN COMPARISON:  None Available. FINDINGS: Lower chest: There are subpleural atelectatic changes in the visualized lung bases. No overt consolidation. No pleural effusion. The heart is normal in size. No pericardial effusion. Hepatobiliary: The liver is normal in size. Non-cirrhotic configuration. No suspicious mass. No intrahepatic or extrahepatic bile duct dilation. No calcified gallstones. Normal gallbladder wall thickness. No pericholecystic inflammatory changes. Pancreas: Unremarkable. No pancreatic  ductal dilatation or surrounding inflammatory changes. Spleen: Within normal limits. No focal lesion. Adrenals/Urinary Tract: Adrenal glands are unremarkable. No suspicious renal mass. No hydronephrosis. No renal or ureteric calculi. Unremarkable urinary bladder. Stomach/Bowel: No disproportionate dilation of the small or large bowel loops. There is dilated and thick-walled appendix measuring up to 1.5 cm in diameter. There is mild to moderate irregular wall thickening. There is moderate amount of periappendiceal fat stranding. There are at least 2 appendicoliths, 1 at the base of the appendix and another bigger 1 at the tip of the appendix. Findings are compatible with acute uncomplicated appendicitis. There are multiple diverticula mainly in the sigmoid colon, without imaging signs of diverticulitis. Vascular/Lymphatic: No ascites or pneumoperitoneum. No abdominal or pelvic lymphadenopathy, by size criteria. No aneurysmal dilation of the major abdominal arteries. There are moderate peripheral atherosclerotic vascular calcifications of the  aorta and its major branches. Reproductive: Small/atrophic uterus versus supracervical hysterectomy. Correlate with surgical history. No large adnexal mass. Other: There is a tiny fat containing umbilical hernia. The soft tissues and abdominal wall are otherwise unremarkable. Musculoskeletal: No suspicious osseous lesions. There are mild - moderate multilevel degenerative changes in the visualized spine. IMPRESSION: 1. Findings compatible with acute uncomplicated appendicitis. 2. Multiple other nonacute observations, as described above. Aortic Atherosclerosis (ICD10-I70.0). Electronically Signed   By: Beula Brunswick M.D.   On: 06/24/2023 14:42    Procedures Procedures    Medications Ordered in ED Medications  piperacillin -tazobactam (ZOSYN ) IVPB 3.375 g (0 g Intravenous Stopped 06/24/23 1611)    Followed by  piperacillin -tazobactam (ZOSYN ) IVPB 3.375 g (has no administration in time range)  dicyclomine  (BENTYL ) injection 20 mg (20 mg Intramuscular Given 06/24/23 1349)  ondansetron  (ZOFRAN ) injection 4 mg (4 mg Intravenous Given 06/24/23 1347)  sodium chloride  0.9 % bolus 500 mL (0 mLs Intravenous Stopped 06/24/23 1611)  iohexol  (OMNIPAQUE ) 300 MG/ML solution 100 mL (100 mLs Intravenous Contrast Given 06/24/23 1425)  fentaNYL  (SUBLIMAZE ) injection 50 mcg (50 mcg Intravenous Given 06/24/23 1519)    ED Course/ Medical Decision Making/ A&P                                 Medical Decision Making Amount and/or Complexity of Data Reviewed Labs: ordered. Radiology: ordered.  Risk Prescription drug management.     Differential diagnosis includes but is not limited to Cholelithiasis, cholangitis, choledocholithiasis, peptic ulcer, gastritis, gastroenteritis, appendicitis, IBS, IBD, DKA, nephrolithiasis, UTI, pyelonephritis, pancreatitis, diverticulitis, mesenteric ischemia, abdominal aortic aneurysm, small bowel obstruction, volvulus   ED Course:  Upon initial evaluation, patient is  well-appearing, stable vitals aside from mildly elevated blood pressure of 148/75.  She does have generalized abdominal tenderness on exam with mild guarding.  No rebound.  No active vomiting.  Rectal exam performed with nurse chaperone present, stool appeared slightly greenish-brown.  Labs Ordered: I Ordered, and personally interpreted labs.  The pertinent results include:   CBC with leukocytosis of 17.3 CMP unremarkable Lipase unremarkable Hemoccult negative Urinalysis with high specific gravity and red blood cells present.  No nitrates or leukocytes  Imaging Studies ordered: I ordered imaging studies including CT abdomen pelvis I independently visualized the imaging with scope of interpretation limited to determining acute life threatening conditions related to emergency care. Imaging showed  Acute uncomplicated appendicitis I agree with the radiologist interpretation   Cardiac Monitoring: / EKG: The patient was maintained on a cardiac monitor.  I personally viewed and interpreted the cardiac monitored  which showed an underlying rhythm of: Normal sinus rhythm   Consultations Obtained: I requested consultation with general surgery Noland Battles PA-C,  and discussed lab and imaging findings as well as pertinent plan - they recommend: Transfer to [facility] ER and general surgery will see patient in anticipation for appendectomy  Medications Given: Bentyl  and fentanyl  given for pain Zofran  for nausea NS bolus for dehydration Zosyn  for appendicitis  2:50pm Upon re-evaluation, patient still well-appearing, stable vital signs aside from the elevated blood pressure.  She did report a black stool yesterday, but Hemoccult negative.  Suspect this change in stool appearance was from the magnesium citrate she took for her constipation. No signs of UTI on urine.  She has a leukocytosis of 17 and signs of appendicitis on CT, suspect this is because of patient's symptoms.  No signs of perforation  or abscess.  She was started on Zosyn  and fluids, being kept NPO. No signs of sepsis.  4:10 PM.  Upon reevaluation, patient reports pain better controlled with the fentanyl .  Discussed plan for transfer to [facility] for appendectomy.  Patient in agreement with plan.    Impression: Acute uncomplicated appendicitis   Disposition:  Patient transferred to [facility] ER via [facility] in anticipation for further care by general surgery. Dr. Nora Beal in ER accepting   Record Review: External records from outside source obtained and reviewed including PCP visit from yesterday     This chart was dictated using voice recognition software, Dragon. Despite the best efforts of this provider to proofread and correct errors, errors may still occur which can change documentation meaning.          Final Clinical Impression(s) / ED Diagnoses Final diagnoses:  Acute appendicitis with generalized peritonitis without gangrene, perforation, or abscess    Rx / DC Orders ED Discharge Orders     None         Rexie Catena, PA-C 06/24/23 1612    Sueellen Emery, MD 06/25/23 [phone]

## 2023-06-24 NOTE — Telephone Encounter (Signed)
 Noted.

## 2023-06-24 NOTE — Telephone Encounter (Addendum)
 Copied from CRM [id number]. Topic: Clinical - Medical Advice >> Jun 24, 2023 12:04 PM Ovid Blow wrote: Reason for CRM: Patient was diagnosed with Diverticulitis and is in severe pain. Patient stated that it was unbearable and is really irritated. Wants to speak with a nurse   Chief Complaint: Abdominal Pain Symptoms: Pain, fever, feeling weak Frequency: 3 days Pertinent Negatives: Patient denies vomiting blood or bright red blood in her stool Disposition: [x] ED /[] Urgent Care (no appt availability in office) / [] Appointment(In office/virtual)/ []  [facility]/ [] Home Care/ [] Refused Recommended Disposition /[] [facility]/ []  Follow-up with PCP Additional Notes: Patient called and advised that she is getting worse and her pain is unbearable. She states that she feels like she is getting weak because she hasn't been able to eat or drink anything. She states that she was diagnosed with diverticulitis yesterday at her PCP office. Patient states that she has had a fever but she didn't know exactly what her temperature is because she has been taking Tylenol .  She states she had diarrhea after drinking Magnesium Citrate and she states it was black in color.  Patient states she only had any Pepto Bismol the first day that she was feeling sick and had the diarrhea episode at 1am after drinking the Magnesium Citrate. She states that she has been following the directions from her PCP Office and thinks something else might be going on. This RN immediately recommended that the patient goes to the Emergency Room for her severe pain, weakness, and inability to eat/drink anything. This RN offered to call an ambulance to take the patient to the Emergency Room but the patient absolutely refused wanting an ambulance called for her at this time. Patient states she is going to drive herself to the Emergency Room. This RN advised that it is recommended that she not drive herself for safety.  This RN  asked her if she was sure she didn't want an ambulance and asked if she had anyone who could be with her and drive her.  Patient denied having anyone else to call and did not want an ambulance.  She states that she is going to go to the ER now to be evaluated. She is advised that if she needs 911 at any point to call them and patient verbalized understanding and hung up.   Reason for Disposition  [1] SEVERE pain (e.g., excruciating) AND [2] present > 1 hour  Protocols used: Abdominal Pain - Female-A-AH

## 2023-06-24 NOTE — Anesthesia Procedure Notes (Signed)
 Procedure Name: Intubation Date/Time: 06/24/2023 9:55 PM  Performed by: Virgil Griffiths, CRNAPre-anesthesia Checklist: Patient identified, Emergency Drugs available, Suction available and Patient being monitored Patient Re-evaluated:Patient Re-evaluated prior to induction Oxygen Delivery Method: Circle System Utilized Preoxygenation: Pre-oxygenation with 100% oxygen Induction Type: IV induction Ventilation: Mask ventilation without difficulty Laryngoscope Size: Mac and 3 Grade View: Grade I Tube type: Oral Tube size: 7.0 mm Number of attempts: 1 Airway Equipment and Method: Stylet and Oral airway Placement Confirmation: ETT inserted through vocal cords under direct vision, positive ETCO2 and breath sounds checked- equal and bilateral Secured at: 21 cm Tube secured with: Tape Dental Injury: Teeth and Oropharynx as per pre-operative assessment

## 2023-06-24 NOTE — Op Note (Signed)
 06/24/2023  11:10 PM  PATIENT:  Carly Myers  78 y.o. female  PRE-OPERATIVE DIAGNOSIS:  ACUTE APPENDICITIS  POST-OPERATIVE DIAGNOSIS:  ACUTE APPENDICITIS  PROCEDURE:  Procedure(s): APPENDECTOMY, LAPAROSCOPIC (N/A)  SURGEON:  Surgeons and Role:    Caralyn Chandler, MD - Primary  PHYSICIAN ASSISTANT:   ASSISTANTS: none   ANESTHESIA:   local and general  EBL:  20cc   BLOOD ADMINISTERED:none  DRAINS: none   LOCAL MEDICATIONS USED:  MARCAINE     SPECIMEN:  Source of Specimen:  appendix  DISPOSITION OF SPECIMEN:  PATHOLOGY  COUNTS:  YES  TOURNIQUET:  * No tourniquets in log *  DICTATION: .Dragon Dictation  After informed consent was obtained patient was brought to the operating room placed in the supine position on the operating room table. After adequate induction of general anesthesia the patient's abdomen was prepped with ChloraPrep, allowed to dry, and draped in usual sterile manner. The area below the umbilicus was infiltrated with quarter percent Marcaine. A small incision was made with a 15 blade knife. This incision was carried down through the subcutaneous tissue bluntly with a hemostat and Army-Navy retractors until the linea alba was identified. The linea alba was incised with a 15 blade knife. Each side was grasped Coker clamps and elevated anteriorly. The preperitoneal space was probed bluntly with a hemostat until the peritoneum was opened and access was gained to the abdominal cavity. A 0 Vicryl purse string stitch was placed in the fascia surrounding the opening. A Hassan cannula was placed through the opening and anchored in place with the previously placed Vicryl purse string stitch. The laparoscope was placed through the [facility] cannula. The abdomen was insufflated with carbon dioxide without difficulty. Next the suprapubic area was infiltrated with quarter percent Marcaine. A small incision was made with a 15 blade knife. A 5 mm port was placed bluntly through this  incision into the abdominal cavity. A site was then chosen between the 2 port for placement of a 5 mm port. The area was infiltrated with quarter percent Marcaine. A small stab incision was made with a 15 blade knife. A 5 mm port was placed bluntly through this incision and the abdominal cavity under direct vision. The laparoscope was then moved to the suprapubic port. Using a Glassman grasper and harmonic scalpel the right lower quadrant was inspected. The appendix was readily identified. The appendix was elevated anteriorly and the mesoappendix was taken down sharply with the harmonic scalpel. Once the base of the appendix where it joined the cecum was identified and cleared of any tissue then a laparoscopic GIA green load stapler was placed through the [facility] cannula. The stapler was placed across the base of the appendix clamped and fired thereby dividing the base of the appendix between staple lines. A laparoscopic bag was then inserted through the [facility] cannula. The appendix was placed within the bag and the bag was sealed.  The area was examined and the tissue at the staple line did not appear to be very healthy.  I was able to bluntly dissected further up the base of the appendix to where it joined the cecum and fire a second staple line at this point.  The tissue around the staples at this point looked healthy and viable.  The abdomen was then irrigated with copious amounts of saline until the effluent was clear. No other abnormalities were noted. The appendix and bag were removed with the [facility] cannula through the infraumbilical port without difficulty. The  fascial defect was closed with the previously placed Vicryl pursestring stitch as well as with another interrupted 0 Vicryl figure-of-eight stitch. The rest of the ports were removed under direct vision and were found to be hemostatic. The gas was allowed to escape. The skin incisions were closed with interrupted 4-0 Monocryl subcuticular stitches.  Dermabond dressings were applied. The patient tolerated the procedure well. At the end of the case all needle sponge and instrument counts were correct. The patient was then awakened and taken to recovery in stable condition.  PLAN OF CARE: Admit for overnight observation  PATIENT DISPOSITION:  PACU - hemodynamically stable.   Delay start of Pharmacological VTE agent (>24hrs) due to surgical blood loss or risk of bleeding: no

## 2023-06-24 NOTE — ED Notes (Signed)
Pt transported to OR via OR team.

## 2023-06-24 NOTE — ED Provider Notes (Signed)
  Physical Exam  BP 133/60   Pulse 93   Temp 99.2 F (37.3 C) (Oral)   Resp (!) 22   Ht 5\' 6"  (1.676 m)   Wt 56.7 kg   SpO2 96%   BMI 20.18 kg/m   Physical Exam Vitals and nursing note reviewed.  Constitutional:      General: She is not in acute distress.    Appearance: She is well-developed.  HENT:     Head: Normocephalic.     Mouth/Throat:     Mouth: Mucous membranes are moist.  Eyes:     Extraocular Movements: Extraocular movements intact.  Cardiovascular:     Rate and Rhythm: Normal rate.  Pulmonary:     Effort: Pulmonary effort is normal.  Abdominal:     General: Abdomen is flat.     Palpations: Abdomen is soft.     Tenderness: There is abdominal tenderness in the right lower quadrant and left lower quadrant. There is guarding. There is no rebound.  Skin:    General: Skin is warm and dry.  Neurological:     General: No focal deficit present.     Mental Status: She is alert and oriented to person, place, and time.  Psychiatric:        Mood and Affect: Mood normal.        Behavior: Behavior normal.     Procedures  Procedures  ED Course / MDM    Medical Decision Making Patient was [facility] evaluation.  In short this is a 78 year old female with a history of A-fib with Watchman device, hypertension, hyperlipidemia who presented to the emergency department with abdominal pain, nausea and vomiting and was found to have acute appendicitis.  Mylinda Asa PA at [facility] spoke with Cary Clarks PA with general surgery who recommended transfer here for surgical evaluation.  Patient is hemodynamically stable on arrival and reports that her pain is controlled.  She has received IV antibiotics.  Will notify general surgery she is here for disposition planning.  Amount and/or Complexity of Data Reviewed Labs: ordered. Radiology: ordered.  Risk Prescription drug management. Decision regarding hospitalization.   6:21 PM I spoke  with Dr. Alethea Andes with general surgery who plans for OR tonight, states will be a few hours until OR is available.       Kingsley, Eligio Angert K, DO 06/24/23 [phone]

## 2023-06-24 NOTE — Anesthesia Preprocedure Evaluation (Addendum)
 Anesthesia Evaluation  Patient identified by MRN, date of birth, ID band Patient awake    Reviewed: Allergy & Precautions, NPO status , Patient's Chart, lab work & pertinent test results  History of Anesthesia Complications Negative for: history of anesthetic complications  Airway Mallampati: I  TM Distance: >3 FB Neck ROM: Full    Dental  (+) Edentulous Upper, Edentulous Lower, Dental Advisory Given   Pulmonary neg shortness of breath, neg sleep apnea, neg COPD, neg recent URI, former smoker   breath sounds clear to auscultation       Cardiovascular hypertension, Pt. on medications and Pt. on home beta blockers + Peripheral Vascular Disease  + dysrhythmias Atrial Fibrillation  Rhythm:Regular  1. Small chicken wing appendage with no thrombus. Well placed 20 mm FLX  device with no leak by color flow and negative tugg test Average  compression 9.8% with 1/3 rd shoulder on coumadin ridge and mitral annular  sides side.   2. Left ventricular ejection fraction, by estimation, is 60 to 65%. The  left ventricle has normal function. The left ventricle has no regional  wall motion abnormalities.   3. Right ventricular systolic function is normal. The right ventricular  size is normal.   4. Left atrial size was moderately dilated. No left atrial/left atrial  appendage thrombus was detected.   5. Right atrial size was mildly dilated.   6. No change in trivial RV anterior effusion post procedure.   7. The mitral valve is normal in structure. Trivial mitral valve  regurgitation. No evidence of mitral stenosis.   8. The aortic valve is tricuspid. Aortic valve regurgitation is not  visualized. No aortic stenosis is present.   9. The inferior vena cava is normal in size with greater than 50%  respiratory variability, suggesting right atrial pressure of 3 mmHg.  10. Evidence of atrial level shunting detected by color flow Doppler.      Neuro/Psych  PSYCHIATRIC DISORDERS Anxiety Depression    negative neurological ROS     GI/Hepatic Neg liver ROS,,,  Endo/Other  Lab Results      Component                Value               Date                      HGBA1C                   6.0                 06/22/2023             Renal/GU Lab Results      Component                Value               Date                      NA                       134 (L)             06/24/2023                K  4.2                 06/24/2023                CO2                      22                  06/24/2023                GLUCOSE                  148 (H)             06/24/2023                BUN                      12                  06/24/2023                CREATININE               0.86                06/24/2023                CALCIUM                   9.5                 06/24/2023                GFR                      76.68               06/22/2023                EGFR                     73                  06/16/2022                GFRNONAA                 >60                 06/24/2023                Musculoskeletal  (+) Arthritis ,    Abdominal   Peds  Hematology Lab Results      Component                Value               Date                      WBC                      17.3 (H)            06/24/2023                HGB                      12.4  06/24/2023                HCT                      37.5                06/24/2023                MCV                      96.6                06/24/2023                PLT                      310                 06/24/2023              Anesthesia Other Findings   Reproductive/Obstetrics                             Anesthesia Physical Anesthesia Plan  ASA: 2  Anesthesia Plan: General   Post-op Pain Management: Ofirmev  IV (intra-op)* and Toradol IV (intra-op)*   Induction: Rapid sequence, Cricoid pressure  planned and Intravenous  PONV Risk Score and Plan: 3 and Ondansetron  and Dexamethasone   Airway Management Planned: Oral ETT  Additional Equipment: None  Intra-op Plan:   Post-operative Plan: Extubation in OR  Informed Consent: I have reviewed the patients History and Physical, chart, labs and discussed the procedure including the risks, benefits and alternatives for the proposed anesthesia with the patient or authorized representative who has indicated his/her understanding and acceptance.     Dental advisory given  Plan Discussed with: CRNA  Anesthesia Plan Comments:        Anesthesia Quick Evaluation

## 2023-06-24 NOTE — Transfer of Care (Signed)
 Immediate Anesthesia Transfer of Care Note  Patient: Carly Myers  Procedure(s) Performed: APPENDECTOMY, LAPAROSCOPIC (Abdomen)  Patient Location: PACU  Anesthesia Type:General  Level of Consciousness: awake, alert , and oriented  Airway & Oxygen Therapy: Patient Spontanous Breathing  Post-op Assessment: Report given to RN and Post -op Vital signs reviewed and stable  Post vital signs: Reviewed and stable  Last Vitals:  Vitals Value Taken Time  BP 118/59 06/24/23 2319  Temp    Pulse 83 06/24/23 2321  Resp 22 06/24/23 2321  SpO2 94 % 06/24/23 2321  Vitals shown include unfiled device data.  Last Pain:  Vitals:   06/24/23 1648  TempSrc:   PainSc: 5          Complications: No notable events documented.

## 2023-06-24 NOTE — ED Notes (Signed)
 Gen Surg came and spoke with pt.

## 2023-06-24 NOTE — ED Triage Notes (Signed)
 Pt via pov from home with abdominal pain and constipation since Sunday. She saw pcp on Monday, thought she had food poisoning. PCP gave her mag citrate and she had a black, runny stool this morning. No hx of constipation. Endorses nausea, no vomiting since Monday. Denies urinary symptoms. Pt alert & oriented, nad noted.

## 2023-06-24 NOTE — H&P (Signed)
 Carly Myers is an 78 y.o. female.   Chief Complaint: Abdominal pain HPI: The patient is a 78 year old white female who presents with abdominal pain that started Sunday night.  The pain is gotten progressively worse over the last 3 days.  She denies any fevers.  The pain was diffuse with more severe localization in the right lower quadrant.  The pain has been associated with nausea and vomiting.  She went to the emergency department today where a CT scan was performed and was consistent with appendicitis with 2 appendicoliths and no evidence of abscess.  She does not smoke.  She is not on blood thinners.  Past Medical History:  Diagnosis Date   Allergic rhinitis    Anaphylactic reaction to bee sting    Anxiety state    unspecified   Arthritis    Ortho Dr. 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 Darlin Ehrlich 2022   Shingles    Sinusitis    UTI (urinary tract infection)    Vitamin D  deficiency     Past Surgical History:  Procedure Laterality Date   ATRIAL FIBRILLATION ABLATION N/A 01/23/2022   Procedure: ATRIAL FIBRILLATION ABLATION;  Surgeon: Boyce Byes, MD;  Location: [facility] INVASIVE CV LAB;  Service: Cardiovascular;  Laterality: N/A;   LEFT ATRIAL APPENDAGE OCCLUSION N/A 05/15/2022   Procedure: LEFT ATRIAL APPENDAGE OCCLUSION;  Surgeon: Boyce Byes, MD;  Location: [facility] INVASIVE CV LAB;  Service: Cardiovascular;  Laterality: N/A;   NO PAST SURGERIES     TEE WITHOUT  CARDIOVERSION N/A 05/15/2022   Procedure: TRANSESOPHAGEAL ECHOCARDIOGRAM (TEE);  Surgeon: Boyce Byes, MD;  Location: [facility] INVASIVE CV LAB;  Service: Cardiovascular;  Laterality: N/A;    Family History  Problem Relation Age of Onset   Colon cancer Neg Hx    Esophageal cancer Neg Hx    Rectal cancer Neg Hx    Stomach cancer Neg Hx    Social History:  reports that she quit smoking about 15 years ago. Her smoking use included cigarettes. She has never used smokeless tobacco. She reports that she does not currently use alcohol. She reports that she does not use drugs.  Allergies:  Allergies  Allergen Reactions   Bee Venom Swelling    (Not in a hospital admission)   Results for orders placed or performed during the hospital encounter of 06/24/23 (from the past 48 hours)  Lipase, blood     Status: None   Collection Time: 06/24/23  1:17 PM  Result Value Ref Range   Lipase 14 11 - 51 U/L    Comment: Performed at [facility], [address], [place], [place] [address]  Comprehensive metabolic panel     Status: Abnormal   Collection Time: 06/24/23  1:17 PM  Result Value Ref Range   Sodium 134 (L) 135 - 145 mmol/L   Potassium 4.2 3.5 - 5.1  mmol/L   Chloride 100 98 - 111 mmol/L   CO2 22 22 - 32 mmol/L   Glucose, Bld 148 (H) 70 - 99 mg/dL    Comment: Glucose reference range applies only to samples taken after fasting for at least 8 hours.   BUN 12 8 - 23 mg/dL   Creatinine, Ser 4.78 0.44 - 1.00 mg/dL   Calcium  9.5 8.9 - 10.3 mg/dL   Total Protein 6.8 6.5 - 8.1 g/dL   Albumin 4.0 3.5 - 5.0 g/dL   AST 22 15 - 41 U/L   ALT 17 0 - 44 U/L   Alkaline Phosphatase 64 38 - 126 U/L   Total Bilirubin 0.8 0.0 - 1.2 mg/dL   GFR, Estimated >29 >56 mL/min    Comment: (NOTE) Calculated using the CKD-EPI Creatinine Equation (2021)    Anion gap 12 5 - 15    Comment: Performed at [facility], [address], [place], [place] [address]  CBC     Status:  Abnormal   Collection Time: 06/24/23  1:17 PM  Result Value Ref Range   WBC 17.3 (H) 4.0 - 10.5 K/uL   RBC 3.88 3.87 - 5.11 MIL/uL   Hemoglobin 12.4 12.0 - 15.0 g/dL   HCT 65.7 84.6 - 96.2 %   MCV 96.6 80.0 - 100.0 fL   MCH 32.0 26.0 - 34.0 pg   MCHC 33.1 30.0 - 36.0 g/dL   RDW 95.2 84.1 - 32.4 %   Platelets 310 150 - 400 K/uL   nRBC 0.0 0.0 - 0.2 %    Comment: Performed at [facility], [address] [place], [place] [address]  Occult blood card to lab, stool Provider will collect     Status: None   Collection Time: 06/24/23  1:40 PM  Result Value Ref Range   Fecal Occult Bld NEGATIVE NEGATIVE    Comment: Performed at [facility], [address], [place], [place] [address]  Urinalysis, Routine w reflex microscopic -Urine, Unspecified Source     Status: Abnormal   Collection Time: 06/24/23  2:45 PM  Result Value Ref Range   Color, Urine YELLOW YELLOW   APPearance CLEAR CLEAR   Specific Gravity, Urine >1.046 (H) 1.005 - 1.030   pH 8.0 5.0 - 8.0   Glucose, UA NEGATIVE NEGATIVE mg/dL   Hgb urine dipstick MODERATE (A) NEGATIVE   Bilirubin Urine NEGATIVE NEGATIVE   Ketones, ur NEGATIVE NEGATIVE mg/dL   Protein, ur 30 (A) NEGATIVE mg/dL   Nitrite NEGATIVE NEGATIVE   Leukocytes,Ua NEGATIVE NEGATIVE   RBC / HPF 21-50 0 - 5 RBC/hpf   WBC, UA 0-5 0 - 5 WBC/hpf   Bacteria, UA NONE SEEN NONE SEEN   Squamous Epithelial / HPF 0-5 0 - 5 /HPF   Mucus PRESENT     Comment: Performed at [facility], [address], [place], [place] [address]   CT ABDOMEN PELVIS W CONTRAST Result Date: 06/24/2023 CLINICAL DATA:  Abdominal pain, acute, nonlocalized. EXAM: CT ABDOMEN AND PELVIS WITH CONTRAST TECHNIQUE: Multidetector CT imaging of the abdomen and pelvis was performed using the standard protocol following bolus administration of intravenous contrast. RADIATION DOSE REDUCTION: This exam was performed according to the departmental  dose-optimization program which includes automated exposure control, adjustment of the mA and/or kV according to patient size and/or use of iterative reconstruction technique. CONTRAST:  OMNIPAQUE  IOHEXOL  300 MG/ML  SOLN COMPARISON:  None Available. FINDINGS: Lower chest: There are subpleural atelectatic changes in  the visualized lung bases. No overt consolidation. No pleural effusion. The heart is normal in size. No pericardial effusion. Hepatobiliary: The liver is normal in size. Non-cirrhotic configuration. No suspicious mass. No intrahepatic or extrahepatic bile duct dilation. No calcified gallstones. Normal gallbladder wall thickness. No pericholecystic inflammatory changes. Pancreas: Unremarkable. No pancreatic ductal dilatation or surrounding inflammatory changes. Spleen: Within normal limits. No focal lesion. Adrenals/Urinary Tract: Adrenal glands are unremarkable. No suspicious renal mass. No hydronephrosis. No renal or ureteric calculi. Unremarkable urinary bladder. Stomach/Bowel: No disproportionate dilation of the small or large bowel loops. There is dilated and thick-walled appendix measuring up to 1.5 cm in diameter. There is mild to moderate irregular wall thickening. There is moderate amount of periappendiceal fat stranding. There are at least 2 appendicoliths, 1 at the base of the appendix and another bigger 1 at the tip of the appendix. Findings are compatible with acute uncomplicated appendicitis. There are multiple diverticula mainly in the sigmoid colon, without imaging signs of diverticulitis. Vascular/Lymphatic: No ascites or pneumoperitoneum. No abdominal or pelvic lymphadenopathy, by size criteria. No aneurysmal dilation of the major abdominal arteries. There are moderate peripheral atherosclerotic vascular calcifications of the aorta and its major branches. Reproductive: Small/atrophic uterus versus supracervical hysterectomy. Correlate with surgical history. No large adnexal mass.  Other: There is a tiny fat containing umbilical hernia. The soft tissues and abdominal wall are otherwise unremarkable. Musculoskeletal: No suspicious osseous lesions. There are mild - moderate multilevel degenerative changes in the visualized spine. IMPRESSION: 1. Findings compatible with acute uncomplicated appendicitis. 2. Multiple other nonacute observations, as described above. Aortic Atherosclerosis (ICD10-I70.0). Electronically Signed   By: Beula Brunswick M.D.   On: 06/24/2023 14:42    Review of Systems  Constitutional: Negative.   HENT: Negative.    Eyes: Negative.   Respiratory: Negative.    Cardiovascular: Negative.   Gastrointestinal:  Positive for abdominal pain, nausea and vomiting.  Endocrine: Negative.   Genitourinary: Negative.   Musculoskeletal: Negative.   Skin: Negative.   Allergic/Immunologic: Negative.   Neurological: Negative.   Hematological: Negative.   Psychiatric/Behavioral: Negative.      Blood pressure 133/60, pulse 93, temperature 99.2 F (37.3 C), temperature source Oral, resp. rate (!) 22, height 5\' 6"  (1.676 m), weight 56.7 kg, SpO2 96%. Physical Exam Vitals reviewed.  Constitutional:      General: She is not in acute distress.    Appearance: Normal appearance.  HENT:     Head: Normocephalic and atraumatic.     Right Ear: External ear normal.     Left Ear: External ear normal.     Nose: Nose normal.     Mouth/Throat:     Mouth: Mucous membranes are dry.     Pharynx: Oropharynx is clear.  Eyes:     General: No scleral icterus.    Extraocular Movements: Extraocular movements intact.     Conjunctiva/sclera: Conjunctivae normal.     Pupils: Pupils are equal, round, and reactive to light.  Cardiovascular:     Rate and Rhythm: Normal rate and regular rhythm.     Pulses: Normal pulses.     Heart sounds: Normal heart sounds.  Pulmonary:     Effort: Pulmonary effort is normal. No respiratory distress.     Breath sounds: Normal breath sounds.   Abdominal:     General: Abdomen is flat.     Tenderness: There is abdominal tenderness.     Comments: There is mild diffuse tenderness with more severe focal right lower quadrant tenderness and  guarding  Musculoskeletal:        General: No swelling or deformity. Normal range of motion.     Cervical back: Normal range of motion and neck supple.  Skin:    General: Skin is warm and dry.     Coloration: Skin is not jaundiced.  Neurological:     General: No focal deficit present.     Mental Status: She is alert and oriented to person, place, and time.  Psychiatric:        Mood and Affect: Mood normal.        Behavior: Behavior normal.      Assessment/Plan The patient appears to have acute appendicitis.  Because of the risk of perforation and sepsis I feel she would benefit from having her appendix removed.  She would also like to have this done.  I have discussed with her in detail the risk and benefits of the operation to remove the appendix as well as some of the technical aspects and she understands and wishes to proceed.  We will start her on broad-spectrum antibiotic therapy.  We will admit her to the hospital tonight and plan for surgery this evening once the operating room is available  Lillette Reid III, MD 06/24/2023, 7:35 PM

## 2023-06-24 NOTE — Discharge Instructions (Addendum)

## 2023-06-25 ENCOUNTER — Encounter (HOSPITAL_COMMUNITY): Payer: Self-pay | Admitting: General Surgery

## 2023-06-25 DIAGNOSIS — K352 Acute appendicitis with generalized peritonitis, without perforation or abscess: Secondary | ICD-10-CM | POA: Diagnosis not present

## 2023-06-25 MED ORDER — ONDANSETRON HCL 4 MG/2ML IJ SOLN
4.0000 mg | Freq: Four times a day (QID) | INTRAMUSCULAR | Status: DC | PRN
Start: 2023-06-25 — End: 2023-06-26

## 2023-06-25 MED ORDER — OXYCODONE HCL 5 MG PO TABS
5.0000 mg | ORAL_TABLET | ORAL | Status: DC | PRN
  Administered 2023-06-25 – 2023-06-26 (×2): 5 mg via ORAL
  Filled 2023-06-25 (×3): qty 1

## 2023-06-25 MED ORDER — MORPHINE SULFATE (PF) 2 MG/ML IV SOLN
1.0000 mg | INTRAVENOUS | Status: DC | PRN
  Administered 2023-06-25 – 2023-06-26 (×3): 2 mg via INTRAVENOUS
  Filled 2023-06-25 (×3): qty 1

## 2023-06-25 MED ORDER — ROSUVASTATIN CALCIUM 10 MG PO TABS
20.0000 mg | ORAL_TABLET | ORAL | Status: DC
  Administered 2023-06-25: 20 mg via ORAL
  Filled 2023-06-25: qty 2

## 2023-06-25 MED ORDER — DOCUSATE SODIUM 100 MG PO CAPS
100.0000 mg | ORAL_CAPSULE | Freq: Two times a day (BID) | ORAL | Status: DC
  Administered 2023-06-25 – 2023-06-26 (×3): 100 mg via ORAL
  Filled 2023-06-25 (×3): qty 1

## 2023-06-25 MED ORDER — ROSUVASTATIN CALCIUM 10 MG PO TABS: 40.0000 mg | ORAL_TABLET | ORAL | Status: DC

## 2023-06-25 MED ORDER — OXYCODONE HCL 5 MG PO TABS: 5.0000 mg | ORAL_TABLET | ORAL | Status: DC | PRN

## 2023-06-25 MED ORDER — SODIUM CHLORIDE 0.9 % IV SOLN: INTRAVENOUS | Status: DC

## 2023-06-25 MED ORDER — PANTOPRAZOLE SODIUM 40 MG IV SOLR
40.0000 mg | Freq: Every day | INTRAVENOUS | Status: DC
  Administered 2023-06-25 (×2): 40 mg via INTRAVENOUS
  Filled 2023-06-25 (×2): qty 10

## 2023-06-25 MED ORDER — METHOCARBAMOL 500 MG PO TABS
500.0000 mg | ORAL_TABLET | Freq: Four times a day (QID) | ORAL | Status: DC | PRN
  Administered 2023-06-25 – 2023-06-26 (×3): 500 mg via ORAL
  Filled 2023-06-25 (×3): qty 1

## 2023-06-25 MED ORDER — IBUPROFEN 200 MG PO TABS
600.0000 mg | ORAL_TABLET | Freq: Three times a day (TID) | ORAL | Status: DC
  Administered 2023-06-25 – 2023-06-26 (×4): 600 mg via ORAL
  Filled 2023-06-25 (×4): qty 3

## 2023-06-25 MED ORDER — ONDANSETRON 4 MG PO TBDP
4.0000 mg | ORAL_TABLET | Freq: Four times a day (QID) | ORAL | Status: DC | PRN
Start: 2023-06-25 — End: 2023-06-26

## 2023-06-25 MED ORDER — METOPROLOL SUCCINATE ER 25 MG PO TB24
25.0000 mg | ORAL_TABLET | Freq: Every day | ORAL | Status: DC
  Administered 2023-06-25 – 2023-06-26 (×2): 25 mg via ORAL
  Filled 2023-06-25 (×2): qty 1

## 2023-06-25 MED ORDER — ENOXAPARIN SODIUM 30 MG/0.3ML IJ SOSY
30.0000 mg | PREFILLED_SYRINGE | INTRAMUSCULAR | Status: DC
  Administered 2023-06-25 – 2023-06-26 (×2): 30 mg via SUBCUTANEOUS
  Filled 2023-06-25 (×2): qty 0.3

## 2023-06-25 MED ORDER — POLYETHYLENE GLYCOL 3350 17 G PO PACK
17.0000 g | PACK | Freq: Every day | ORAL | Status: DC
  Administered 2023-06-25: 17 g via ORAL
  Filled 2023-06-25: qty 1

## 2023-06-25 MED ORDER — PIPERACILLIN-TAZOBACTAM 3.375 G IVPB
3.3750 g | Freq: Three times a day (TID) | INTRAVENOUS | Status: DC
  Administered 2023-06-25 – 2023-06-26 (×4): 3.375 g via INTRAVENOUS
  Filled 2023-06-25 (×4): qty 50

## 2023-06-25 MED ORDER — EPINEPHRINE 0.3 MG/0.3ML IJ SOAJ: 0.3000 mg | INTRAMUSCULAR | Status: DC | PRN

## 2023-06-25 MED ORDER — ACETAMINOPHEN 500 MG PO TABS
1000.0000 mg | ORAL_TABLET | Freq: Three times a day (TID) | ORAL | Status: DC
  Administered 2023-06-25: 1000 mg via ORAL
  Filled 2023-06-25 (×2): qty 2

## 2023-06-25 NOTE — Care Management Obs Status (Signed)
 MEDICARE OBSERVATION STATUS NOTIFICATION   Patient Details  Name: Carly Myers MRN: [id number] Date of Birth: 07-11-1945   Medicare Observation Status Notification Given:  Yes    Kathryn Parish, RN 06/25/2023, 9:44 AM

## 2023-06-25 NOTE — TOC Initial Note (Signed)
 Transition of Care [facility]) - Initial/Assessment Note    Patient Details  Name: Carly Myers MRN: [id number] Date of Birth: 01-27-1946  Transition of Care [facility]) CM/SW Contact:    Kathryn Parish, RN Phone Number: 06/25/2023, 9:55 AM  Clinical Narrative:                  Transition of Care [facility]) - Inpatient Brief Assessment   Patient Details  Name: Carly Myers MRN: [id number] Date of Birth: Nov 01, 1945  Transition of Care [facility]) CM/SW Contact:    Kathryn Parish, RN Phone Number: 06/25/2023, 9:55 AM   Clinical Narrative: MOON; Patient lives in a house alone;drives self to appointments;PCP and insurance verified; denies DME, HH, oxygen and SDOH needs; discharge transportation will be Rosy Cooper [phone]) ex-sister in law. No TOC needs identified during visit. TOC signing off.    Transition of Care Asessment: Insurance and Status: Insurance coverage has been reviewed Patient has primary care physician: Yes Home environment has been reviewed: House alone Prior level of function:: Independent Prior/Current Home Services: No current home services Social Drivers of Health Review: SDOH reviewed no interventions necessary Readmission risk has been reviewed: Yes Transition of care needs: no transition of care needs at this time    Barriers to Discharge: No Barriers Identified   Patient Goals and CMS Choice            Expected Discharge Plan and Services                                              Prior Living Arrangements/Services                       Activities of Daily Living   ADL Screening (condition at time of admission) Independently performs ADLs?: Yes (appropriate for developmental age) Is the patient deaf or have difficulty hearing?: No Does the patient have difficulty seeing, even when wearing glasses/contacts?: No Does the patient have difficulty concentrating, remembering, or making decisions?: No  Permission Sought/Granted                   Emotional Assessment              Admission diagnosis:  Acute appendicitis [K35.80] Acute appendicitis with generalized peritonitis without gangrene, perforation, or abscess [K35.200] Patient Active Problem List   Diagnosis Date Noted   Acute appendicitis 06/24/2023   Diverticulitis 06/23/2023   Prediabetes 06/18/2022   Presence of Watchman left atrial appendage closure device 05/15/2022   Atrial fibrillation (HCC) 05/15/2022   Positive colorectal cancer screening using Cologuard test with normal colonoscopy 6/23 11/21/2021   Sensorineural hearing loss (SNHL) of both ears 05/08/2021   Vestibular vertigo 10/26/2020   Cerebral vascular disease 10/26/2020   Essential hypertension 12/28/2019   PAF (paroxysmal atrial fibrillation) (HCC) 12/28/2019   Mixed hyperlipidemia 03/25/2017   Vitamin deficiency 03/25/2017   Osteopenia 03/25/2017   Senile purpura (HCC) 03/25/2017   Allergic rhinitis 03/25/2017   PCP:  Luevenia Saha, MD Pharmacy:   [facility] [phone] [place], [place] - [address] AT [facility] [address] [facility] [address] [place] [address] Phone: [phone] Fax: [phone]     Social Drivers of Health (SDOH) Social History: SDOH Screenings   Food Insecurity: No Food Insecurity (06/25/2023)  Housing: Low Risk  (06/25/2023)  Transportation Needs: No Transportation Needs (06/25/2023)  Utilities: Not At Risk (06/25/2023)  Depression (PHQ2-9): Low Risk  (06/22/2023)  Social Connections: Moderately Isolated (06/25/2023)  Tobacco Use: Medium Risk (06/24/2023)   SDOH Interventions:     Readmission Risk Interventions     No data to display

## 2023-06-25 NOTE — Plan of Care (Signed)

## 2023-06-25 NOTE — Plan of Care (Signed)

## 2023-06-25 NOTE — Progress Notes (Signed)
 [facility] Progress Note  1 Day Post-Op  Subjective: CC:  Reports abdominal soreness and bloating. Denies belching, nausea, vomiting. Had almost 100% clear liquid breakfast. Reports she hasn't had a BM in multiple days  At baseline she lives alone, has 8 small dogs, walks a mile daily.   Objective: Vital signs in last 24 hours: Temp:  [97.5 F (36.4 C)-99.9 F (37.7 C)] 98.6 F (37 C) (05/01 0559) Pulse Rate:  [78-93] 82 (05/01 0559) Resp:  [17-25] 18 (05/01 0559) BP: (114-151)/(56-75) 130/66 (05/01 0559) SpO2:  [92 %-98 %] 98 % (05/01 0559) Weight:  [56.7 kg] 56.7 kg (04/30 1315)    Intake/Output from previous day: 04/30 0701 - 05/01 0700 In: 1000 [I.V.:1000] Out: 25 [Blood:25] Intake/Output this shift: No intake/output data recorded.  PE: Gen:  Alert, NAD, pleasant Card:  Regular rate and rhythm Pulm:  Normal effort Abd: Soft, moderate lower abdominal distention, appropraitely tender, incisions C/D/I Skin: warm and dry, no rashes  Psych: A&Ox3   Lab Results:  Recent Labs    06/24/23 1317  WBC 17.3*  HGB 12.4  HCT 37.5  PLT 310   BMET Recent Labs    06/24/23 1317  NA 134*  K 4.2  CL 100  CO2 22  GLUCOSE 148*  BUN 12  CREATININE 0.86  CALCIUM  9.5   PT/INR No results for input(s): "LABPROT", "INR" in the last 72 hours. CMP     Component Value Date/Time   NA 134 (L) 06/24/2023 1317   NA 139 06/16/2022 1022   K 4.2 06/24/2023 1317   CL 100 06/24/2023 1317   CO2 22 06/24/2023 1317   GLUCOSE 148 (H) 06/24/2023 1317   BUN 12 06/24/2023 1317   BUN 14 06/16/2022 1022   CREATININE 0.86 06/24/2023 1317   CALCIUM  9.5 06/24/2023 1317   PROT 6.8 06/24/2023 1317   ALBUMIN 4.0 06/24/2023 1317   AST 22 06/24/2023 1317   ALT 17 06/24/2023 1317   ALKPHOS 64 06/24/2023 1317   BILITOT 0.8 06/24/2023 1317   GFRNONAA >60 06/24/2023 1317   GFRAA 95 03/28/2019 1407   Lipase     Component Value Date/Time   LIPASE 14 06/24/2023 1317        Studies/Results: CT ABDOMEN PELVIS W CONTRAST Result Date: 06/24/2023 CLINICAL DATA:  Abdominal pain, acute, nonlocalized. EXAM: CT ABDOMEN AND PELVIS WITH CONTRAST TECHNIQUE: Multidetector CT imaging of the abdomen and pelvis was performed using the standard protocol following bolus administration of intravenous contrast. RADIATION DOSE REDUCTION: This exam was performed according to the departmental dose-optimization program which includes automated exposure control, adjustment of the mA and/or kV according to patient size and/or use of iterative reconstruction technique. CONTRAST:  OMNIPAQUE  IOHEXOL  300 MG/ML  SOLN COMPARISON:  None Available. FINDINGS: Lower chest: There are subpleural atelectatic changes in the visualized lung bases. No overt consolidation. No pleural effusion. The heart is normal in size. No pericardial effusion. Hepatobiliary: The liver is normal in size. Non-cirrhotic configuration. No suspicious mass. No intrahepatic or extrahepatic bile duct dilation. No calcified gallstones. Normal gallbladder wall thickness. No pericholecystic inflammatory changes. Pancreas: Unremarkable. No pancreatic ductal dilatation or surrounding inflammatory changes. Spleen: Within normal limits. No focal lesion. Adrenals/Urinary Tract: Adrenal glands are unremarkable. No suspicious renal mass. No hydronephrosis. No renal or ureteric calculi. Unremarkable urinary bladder. Stomach/Bowel: No disproportionate dilation of the small or large bowel loops. There is dilated and thick-walled appendix measuring up to 1.5 cm in diameter. There is mild to moderate irregular  wall thickening. There is moderate amount of periappendiceal fat stranding. There are at least 2 appendicoliths, 1 at the base of the appendix and another bigger 1 at the tip of the appendix. Findings are compatible with acute uncomplicated appendicitis. There are multiple diverticula mainly in the sigmoid colon, without imaging signs of  diverticulitis. Vascular/Lymphatic: No ascites or pneumoperitoneum. No abdominal or pelvic lymphadenopathy, by size criteria. No aneurysmal dilation of the major abdominal arteries. There are moderate peripheral atherosclerotic vascular calcifications of the aorta and its major branches. Reproductive: Small/atrophic uterus versus supracervical hysterectomy. Correlate with surgical history. No large adnexal mass. Other: There is a tiny fat containing umbilical hernia. The soft tissues and abdominal wall are otherwise unremarkable. Musculoskeletal: No suspicious osseous lesions. There are mild - moderate multilevel degenerative changes in the visualized spine. IMPRESSION: 1. Findings compatible with acute uncomplicated appendicitis. 2. Multiple other nonacute observations, as described above. Aortic Atherosclerosis (ICD10-I70.0). Electronically Signed   By: Beula Brunswick M.D.   On: 06/24/2023 14:42    Anti-infectives: Anti-infectives (From admission, onward)    Start     Dose/Rate Route Frequency Ordered Stop   06/25/23 0200  piperacillin -tazobactam (ZOSYN ) IVPB 3.375 g        3.375 g 12.5 mL/hr over 240 Minutes Intravenous Every 8 hours 06/25/23 0052 07/02/23 0559   06/24/23 2315  piperacillin -tazobactam (ZOSYN ) IVPB 3.375 g  Status:  Discontinued       Placed in "Followed by" Linked Group   3.375 g 12.5 mL/hr over 240 Minutes Intravenous Every 8 hours 06/24/23 1530 06/25/23 0105   06/24/23 2200  piperacillin -tazobactam (ZOSYN ) IVPB 3.375 g  Status:  Discontinued        3.375 g 12.5 mL/hr over 240 Minutes Intravenous Every 8 hours 06/24/23 1530 06/24/23 1530   06/24/23 1530  piperacillin -tazobactam (ZOSYN ) IVPB 3.375 g  Status:  Discontinued        3.375 g 100 mL/hr over 30 Minutes Intravenous  Once 06/24/23 1530 06/24/23 1530   06/24/23 1530  piperacillin -tazobactam (ZOSYN ) IVPB 3.375 g       Placed in "Followed by" Linked Group   3.375 g 100 mL/hr over 30 Minutes Intravenous  Once 06/24/23  1530 06/24/23 1611        Assessment/Plan  Acute appendicitis POD#1 s/p lap appendectomy Dr. Alethea Andes 4/30  - adjust PO pain meds. Add scheduled tylenol  and advil . PRN robaxin  - D/C purewick, OOB - ambulate - IS  - advance to reg diet, possible discharge home tomorrow if tolerating PO and pain better controlled     LOS: 0 days   I reviewed nursing notes, last 24 h vitals and pain scores, last 48 h intake and output, last 24 h labs and trends, and last 24 h imaging results.  This care required straight-forward level of medical decision making.   Michial Akin, PA-C [facility] Please see Amion for pager number during day hours 7:00am-4:30pm

## 2023-06-26 ENCOUNTER — Other Ambulatory Visit (HOSPITAL_COMMUNITY): Payer: Self-pay

## 2023-06-26 DIAGNOSIS — K352 Acute appendicitis with generalized peritonitis, without perforation or abscess: Secondary | ICD-10-CM | POA: Diagnosis not present

## 2023-06-26 LAB — SURGICAL PATHOLOGY

## 2023-06-26 MED ORDER — POLYETHYLENE GLYCOL 3350 17 G PO PACK: 17.0000 g | PACK | Freq: Two times a day (BID) | ORAL | Status: AC | PRN

## 2023-06-26 MED ORDER — POLYETHYLENE GLYCOL 3350 17 G PO PACK: 17.0000 g | PACK | Freq: Two times a day (BID) | ORAL | Status: DC

## 2023-06-26 MED ORDER — ACETAMINOPHEN 500 MG PO TABS
1000.0000 mg | ORAL_TABLET | Freq: Four times a day (QID) | ORAL | 0 refills | Status: AC
  Filled 2023-06-26: qty 30, 4d supply, fill #0

## 2023-06-26 MED ORDER — DOCUSATE SODIUM 100 MG PO CAPS
100.0000 mg | ORAL_CAPSULE | Freq: Two times a day (BID) | ORAL | 0 refills | Status: AC
  Filled 2023-06-26: qty 10, 5d supply, fill #0

## 2023-06-26 MED ORDER — ACETAMINOPHEN 500 MG PO TABS
1000.0000 mg | ORAL_TABLET | Freq: Four times a day (QID) | ORAL | Status: DC
  Filled 2023-06-26: qty 2

## 2023-06-26 MED ORDER — IBUPROFEN 200 MG PO TABS: 600.0000 mg | ORAL_TABLET | Freq: Three times a day (TID) | ORAL | Status: AC | PRN

## 2023-06-26 MED ORDER — OXYCODONE HCL 5 MG PO TABS
5.0000 mg | ORAL_TABLET | Freq: Four times a day (QID) | ORAL | 0 refills | Status: AC | PRN
  Filled 2023-06-26: qty 15, 4d supply, fill #0

## 2023-06-26 MED ORDER — POLYETHYLENE GLYCOL 3350 17 G PO PACK
17.0000 g | PACK | Freq: Two times a day (BID) | ORAL | Status: DC
  Administered 2023-06-26: 17 g via ORAL
  Filled 2023-06-26: qty 1

## 2023-06-26 NOTE — Progress Notes (Signed)
 [facility] Progress Note  2 Days Post-Op  Subjective: CC:  Abd pain better, temporarily improves with pain meds. Tolerating solid food this morning but did have one episode of emesis yesterday after lunch. Denies flatus or BM. Denies significant belching. Has been walking in the hall    Objective: Vital signs in last 24 hours: Temp:  [97.4 F (36.3 C)-98.9 F (37.2 C)] 97.4 F (36.3 C) (05/02 0335) Pulse Rate:  [64-83] 64 (05/02 0335) Resp:  [16-17] 16 (05/02 0335) BP: (102-132)/(55-67) 102/55 (05/02 0335) SpO2:  [95 %-98 %] 95 % (05/02 0335) Last BM Date : 06/21/23  Intake/Output from previous day: 05/01 0701 - 05/02 0700 In: 120 [P.O.:120] Out: -  Intake/Output this shift: No intake/output data recorded.  PE: Gen:  Alert, NAD, pleasant Card:  Regular rate and rhythm Pulm:  Normal effort Abd: Soft, moderate lower abdominal distention, appropraitely tender, incisions C/D/I Skin: warm and dry, no rashes  Psych: A&Ox3   Lab Results:  Recent Labs    06/24/23 1317  WBC 17.3*  HGB 12.4  HCT 37.5  PLT 310   BMET Recent Labs    06/24/23 1317  NA 134*  K 4.2  CL 100  CO2 22  GLUCOSE 148*  BUN 12  CREATININE 0.86  CALCIUM  9.5   PT/INR No results for input(s): "LABPROT", "INR" in the last 72 hours. CMP     Component Value Date/Time   NA 134 (L) 06/24/2023 1317   NA 139 06/16/2022 1022   K 4.2 06/24/2023 1317   CL 100 06/24/2023 1317   CO2 22 06/24/2023 1317   GLUCOSE 148 (H) 06/24/2023 1317   BUN 12 06/24/2023 1317   BUN 14 06/16/2022 1022   CREATININE 0.86 06/24/2023 1317   CALCIUM  9.5 06/24/2023 1317   PROT 6.8 06/24/2023 1317   ALBUMIN 4.0 06/24/2023 1317   AST 22 06/24/2023 1317   ALT 17 06/24/2023 1317   ALKPHOS 64 06/24/2023 1317   BILITOT 0.8 06/24/2023 1317   GFRNONAA >60 06/24/2023 1317   GFRAA 95 03/28/2019 1407   Lipase     Component Value Date/Time   LIPASE 14 06/24/2023 1317       Studies/Results: CT ABDOMEN  PELVIS W CONTRAST Result Date: 06/24/2023 CLINICAL DATA:  Abdominal pain, acute, nonlocalized. EXAM: CT ABDOMEN AND PELVIS WITH CONTRAST TECHNIQUE: Multidetector CT imaging of the abdomen and pelvis was performed using the standard protocol following bolus administration of intravenous contrast. RADIATION DOSE REDUCTION: This exam was performed according to the departmental dose-optimization program which includes automated exposure control, adjustment of the mA and/or kV according to patient size and/or use of iterative reconstruction technique. CONTRAST:  OMNIPAQUE  IOHEXOL  300 MG/ML  SOLN COMPARISON:  None Available. FINDINGS: Lower chest: There are subpleural atelectatic changes in the visualized lung bases. No overt consolidation. No pleural effusion. The heart is normal in size. No pericardial effusion. Hepatobiliary: The liver is normal in size. Non-cirrhotic configuration. No suspicious mass. No intrahepatic or extrahepatic bile duct dilation. No calcified gallstones. Normal gallbladder wall thickness. No pericholecystic inflammatory changes. Pancreas: Unremarkable. No pancreatic ductal dilatation or surrounding inflammatory changes. Spleen: Within normal limits. No focal lesion. Adrenals/Urinary Tract: Adrenal glands are unremarkable. No suspicious renal mass. No hydronephrosis. No renal or ureteric calculi. Unremarkable urinary bladder. Stomach/Bowel: No disproportionate dilation of the small or large bowel loops. There is dilated and thick-walled appendix measuring up to 1.5 cm in diameter. There is mild to moderate irregular wall thickening. There is moderate amount  of periappendiceal fat stranding. There are at least 2 appendicoliths, 1 at the base of the appendix and another bigger 1 at the tip of the appendix. Findings are compatible with acute uncomplicated appendicitis. There are multiple diverticula mainly in the sigmoid colon, without imaging signs of diverticulitis. Vascular/Lymphatic: No  ascites or pneumoperitoneum. No abdominal or pelvic lymphadenopathy, by size criteria. No aneurysmal dilation of the major abdominal arteries. There are moderate peripheral atherosclerotic vascular calcifications of the aorta and its major branches. Reproductive: Small/atrophic uterus versus supracervical hysterectomy. Correlate with surgical history. No large adnexal mass. Other: There is a tiny fat containing umbilical hernia. The soft tissues and abdominal wall are otherwise unremarkable. Musculoskeletal: No suspicious osseous lesions. There are mild - moderate multilevel degenerative changes in the visualized spine. IMPRESSION: 1. Findings compatible with acute uncomplicated appendicitis. 2. Multiple other nonacute observations, as described above. Aortic Atherosclerosis (ICD10-I70.0). Electronically Signed   By: Beula Brunswick M.D.   On: 06/24/2023 14:42    Anti-infectives: Anti-infectives (From admission, onward)    Start     Dose/Rate Route Frequency Ordered Stop   06/25/23 0200  piperacillin -tazobactam (ZOSYN ) IVPB 3.375 g        3.375 g 12.5 mL/hr over 240 Minutes Intravenous Every 8 hours 06/25/23 0052 07/02/23 0559   06/24/23 2315  piperacillin -tazobactam (ZOSYN ) IVPB 3.375 g  Status:  Discontinued       Placed in "Followed by" Linked Group   3.375 g 12.5 mL/hr over 240 Minutes Intravenous Every 8 hours 06/24/23 1530 06/25/23 0105   06/24/23 2200  piperacillin -tazobactam (ZOSYN ) IVPB 3.375 g  Status:  Discontinued        3.375 g 12.5 mL/hr over 240 Minutes Intravenous Every 8 hours 06/24/23 1530 06/24/23 1530   06/24/23 1530  piperacillin -tazobactam (ZOSYN ) IVPB 3.375 g  Status:  Discontinued        3.375 g 100 mL/hr over 30 Minutes Intravenous  Once 06/24/23 1530 06/24/23 1530   06/24/23 1530  piperacillin -tazobactam (ZOSYN ) IVPB 3.375 g       Placed in "Followed by" Linked Group   3.375 g 100 mL/hr over 30 Minutes Intravenous  Once 06/24/23 1530 06/24/23 1611         Assessment/Plan  Acute appendicitis POD#2 s/p lap appendectomy Dr. Alethea Andes 4/30  - pain: tylenol , advil , robaxin , oxy PRN - colace and BID miralax  - ambulate - IS  - suspect she has a mild ileus as well as constipation. Re-check this afternoon. Discharge home later today if no further nausea/emesis and having signs of bowel function    LOS: 0 days   I reviewed nursing notes, last 24 h vitals and pain scores, last 48 h intake and output, last 24 h labs and trends, and last 24 h imaging results.  This care required straight-forward level of medical decision making.   Michial Akin, PA-C [facility] Please see Amion for pager number during day hours 7:00am-4:30pm

## 2023-06-26 NOTE — Discharge Summary (Signed)
 [facility] Discharge Summary   Patient ID: Carly Myers MRN: [id number] DOB/AGE: 1945/08/24 78 y.o.  Admit date: 06/24/2023 Discharge date: 06/26/2023  Admitting Diagnosis: appendicitis  Discharge Diagnosis Patient Active Problem List   Diagnosis Date Noted   Acute appendicitis 06/24/2023   Diverticulitis 06/23/2023   Prediabetes 06/18/2022   Presence of Watchman left atrial appendage closure device 05/15/2022   Atrial fibrillation (HCC) 05/15/2022   Positive colorectal cancer screening using Cologuard test with normal colonoscopy 6/23 11/21/2021   Sensorineural hearing loss (SNHL) of both ears 05/08/2021   Vestibular vertigo 10/26/2020   Cerebral vascular disease 10/26/2020   Essential hypertension 12/28/2019   PAF (paroxysmal atrial fibrillation) (HCC) 12/28/2019   Mixed hyperlipidemia 03/25/2017   Vitamin deficiency 03/25/2017   Osteopenia 03/25/2017   Senile purpura (HCC) 03/25/2017   Allergic rhinitis 03/25/2017    Consultants None  Imaging: CT ABDOMEN PELVIS W CONTRAST Result Date: 06/24/2023 CLINICAL DATA:  Abdominal pain, acute, nonlocalized. EXAM: CT ABDOMEN AND PELVIS WITH CONTRAST TECHNIQUE: Multidetector CT imaging of the abdomen and pelvis was performed using the standard protocol following bolus administration of intravenous contrast. RADIATION DOSE REDUCTION: This exam was performed according to the departmental dose-optimization program which includes automated exposure control, adjustment of the mA and/or kV according to patient size and/or use of iterative reconstruction technique. CONTRAST:  OMNIPAQUE  IOHEXOL  300 MG/ML  SOLN COMPARISON:  None Available. FINDINGS: Lower chest: There are subpleural atelectatic changes in the visualized lung bases. No overt consolidation. No pleural effusion. The heart is normal in size. No pericardial effusion. Hepatobiliary: The liver is normal in size. Non-cirrhotic configuration. No suspicious mass. No  intrahepatic or extrahepatic bile duct dilation. No calcified gallstones. Normal gallbladder wall thickness. No pericholecystic inflammatory changes. Pancreas: Unremarkable. No pancreatic ductal dilatation or surrounding inflammatory changes. Spleen: Within normal limits. No focal lesion. Adrenals/Urinary Tract: Adrenal glands are unremarkable. No suspicious renal mass. No hydronephrosis. No renal or ureteric calculi. Unremarkable urinary bladder. Stomach/Bowel: No disproportionate dilation of the small or large bowel loops. There is dilated and thick-walled appendix measuring up to 1.5 cm in diameter. There is mild to moderate irregular wall thickening. There is moderate amount of periappendiceal fat stranding. There are at least 2 appendicoliths, 1 at the base of the appendix and another bigger 1 at the tip of the appendix. Findings are compatible with acute uncomplicated appendicitis. There are multiple diverticula mainly in the sigmoid colon, without imaging signs of diverticulitis. Vascular/Lymphatic: No ascites or pneumoperitoneum. No abdominal or pelvic lymphadenopathy, by size criteria. No aneurysmal dilation of the major abdominal arteries. There are moderate peripheral atherosclerotic vascular calcifications of the aorta and its major branches. Reproductive: Small/atrophic uterus versus supracervical hysterectomy. Correlate with surgical history. No large adnexal mass. Other: There is a tiny fat containing umbilical hernia. The soft tissues and abdominal wall are otherwise unremarkable. Musculoskeletal: No suspicious osseous lesions. There are mild - moderate multilevel degenerative changes in the visualized spine. IMPRESSION: 1. Findings compatible with acute uncomplicated appendicitis. 2. Multiple other nonacute observations, as described above. Aortic Atherosclerosis (ICD10-I70.0). Electronically Signed   By: Beula Brunswick M.D.   On: 06/24/2023 14:42    Procedures  Dr. Lillette Reid III 06/24/23-  Laparoscopic Appendectomy  Hospital Course:  78 y/o F who presented to the ED with abdominal pain.  Workup showed appendicitis.  Patient was admitted and underwent procedure listed above.  Tolerated procedure well and was transferred to the floor.  Diet was advanced as tolerated.  On POD#2, the patient was  voiding well, tolerating diet, ambulating well, pain well controlled, vital signs stable, incisions c/d/i and felt stable for discharge home.  Patient will follow up in our office as below and knows to call with questions or concerns.    I have personally reviewed the patients medication history on the [place] controlled substance database.   Allergies as of 06/26/2023       Reactions   Bee Venom Swelling        Medication List     STOP taking these medications    amoxicillin -clavulanate 875-125 MG tablet Commonly known as: AUGMENTIN        TAKE these medications    acetaminophen  500 MG tablet Commonly known as: TYLENOL  Take 2 tablets (1,000 mg total) by mouth 4 (four) times daily. What changed:  how much to take when to take this reasons to take this   Artificial Tears ophthalmic solution Place 1 drop into both eyes 3 (three) times daily as needed (for dryness).   aspirin EC 81 MG tablet Take 81 mg by mouth at bedtime. Swallow whole.   Benadryl Allergy 25 MG tablet Generic drug: diphenhydrAMINE Take 12.5 mg by mouth at bedtime as needed for allergies or sleep.   COLLAGEN-VITAMIN C-BIOTIN PO Take 1 capsule by mouth daily.   diazepam  2 MG tablet Commonly known as: VALIUM  Take 1 tablet (2 mg total) by mouth 2 (two) times daily as needed (vertigo). What changed: how much to take   docusate sodium  100 MG capsule Commonly known as: COLACE Take 1 capsule (100 mg total) by mouth 2 (two) times daily. What changed: when to take this   EPINEPHrine  0.3 mg/0.3 mL Soaj injection Commonly known as: EPI-PEN Inject 0.3 mg into the muscle as needed for anaphylaxis.    fluticasone 50 MCG/ACT nasal spray Commonly known as: FLONASE Place 1 spray into both nostrils daily as needed for allergies or rhinitis.   ibuprofen  200 MG tablet Commonly known as: ADVIL  Take 3 tablets (600 mg total) by mouth every 8 (eight) hours as needed.   LORazepam  0.5 MG tablet Commonly known as: ATIVAN  Take 1 tablet (0.5 mg total) by mouth at bedtime as needed for sleep.   metoprolol  succinate 25 MG 24 hr tablet Commonly known as: TOPROL -XL TAKE 1 TABLET IN THE MORNING AND AT BEDTIME What changed: See the new instructions.   multivitamin tablet Take 1 tablet by mouth daily with breakfast.   oxyCODONE  5 MG immediate release tablet Commonly known as: Oxy IR/ROXICODONE  Take 1 tablet (5 mg total) by mouth every 6 (six) hours as needed for severe pain (pain score 7-10) (not releived by tylenol  or advil ).   polyethylene glycol 17 g packet Commonly known as: MIRALAX  / GLYCOLAX  Take 17 g by mouth 2 (two) times daily as needed for moderate constipation.   RESTFUL LEGS PM SL Place 1 tablet under the tongue at bedtime.   rosuvastatin  40 MG tablet Commonly known as: CRESTOR  TAKE ONE-HALF (1/2) TABLET (20 MG) ON SUNDAY, TUESDAY, THURSDAY, AND SATURDAY. TAKE 1 TABLET (40 MG) ON MONDAY, WEDNESDAY, AND FRIDAY What changed:  how much to take how to take this when to take this additional instructions          Follow-up Information     Maczis, Puja Gosai, PA-C. Go on 07/23/2023.   Specialty: General Surgery Why: at 3:30, For post-operative follow up. Please arrive 30 minutes early. Contact information: [address] [place] [address] [phone]  Signed: Michial Akin, [facility] 06/26/2023, 1:52 PM

## 2023-06-26 NOTE — Progress Notes (Signed)
 Discharge medications delivered to patient at bedside [facility]

## 2023-06-26 NOTE — Progress Notes (Signed)
 Mobility Specialist - Progress Note   06/26/23 1349  Mobility  Activity Ambulated independently in hallway  Level of Assistance Independent  Assistive Device None  Distance Ambulated (ft) 350 ft  Activity Response Tolerated well  Mobility Referral Yes  Mobility visit 1 Mobility  Mobility Specialist Start Time (ACUTE ONLY) 1340  Mobility Specialist Stop Time (ACUTE ONLY) 1349  Mobility Specialist Time Calculation (min) (ACUTE ONLY) 9 min   .Pt received in bed and agreeable to mobility. No complaints during session. Pt to bed after session with all needs met.    [facility]

## 2023-07-03 NOTE — Anesthesia Postprocedure Evaluation (Signed)
 Anesthesia Post Note  Patient: Carly Myers  Procedure(s) Performed: APPENDECTOMY, LAPAROSCOPIC (Abdomen)     Patient location during evaluation: PACU Anesthesia Type: General Level of consciousness: awake and alert Pain management: pain level controlled Vital Signs Assessment: post-procedure vital signs reviewed and stable Respiratory status: spontaneous breathing, nonlabored ventilation and respiratory function stable Cardiovascular status: blood pressure returned to baseline and stable Postop Assessment: no apparent nausea or vomiting Anesthetic complications: no   No notable events documented.               Kimo Bancroft

## 2023-08-04 DIAGNOSIS — M1712 Unilateral primary osteoarthritis, left knee: Secondary | ICD-10-CM | POA: Diagnosis not present

## 2023-08-04 DIAGNOSIS — M25562 Pain in left knee: Secondary | ICD-10-CM | POA: Diagnosis not present

## 2023-08-13 DIAGNOSIS — M1712 Unilateral primary osteoarthritis, left knee: Secondary | ICD-10-CM | POA: Diagnosis not present

## 2023-10-06 NOTE — Progress Notes (Signed)
 Cardiology Office Note   Date:  10/08/2023   ID:  Carly Myers, DOB Sep 06, 1945, MRN [id number]  PCP:  Carly Lavern CROME, MD  Cardiologist:   Vina Gull, MD   F/U of  PAF, CAD    History of Present Illness: Carly Myers is a 78 y.o. female with hx of CAD on CT  Chest CT showed atherosclerosis if aorta with calcification of LAD   The pt also has hx of PAF/ paroxysmal atrial flutter on Holter  Started on Toprol  XL and Xarelto    Echo showed LVEF normal   The pt also has a hx of vertigo    Nov 2023  Pt underwent afib ablation March 2024   Watchman device     April 2025  Pt had appendicitis s/p APPY I saw the pt in Nov 2024  She has done well from a cardiac standpoint  No CP   No palpitation    Current Meds  Medication Sig   acetaminophen  (TYLENOL ) 500 MG tablet Take 2 tablets (1,000 mg total) by mouth 4 (four) times daily.   Artificial Tears ophthalmic solution Place 1 drop into both eyes 3 (three) times daily as needed (for dryness).   aspirin EC 81 MG tablet Take 81 mg by mouth at bedtime. Swallow whole.   BENADRYL ALLERGY 25 MG tablet Take 12.5 mg by mouth at bedtime as needed for allergies or sleep.   COLLAGEN-VITAMIN C-BIOTIN PO Take 1 capsule by mouth daily.   diazepam  (VALIUM ) 2 MG tablet Take 1 tablet (2 mg total) by mouth 2 (two) times daily as needed (vertigo). (Patient taking differently: Take 1 mg by mouth 2 (two) times daily as needed (vertigo).)   docusate sodium  (COLACE) 100 MG capsule Take 1 capsule (100 mg total) by mouth 2 (two) times daily.   EPINEPHrine  0.3 mg/0.3 mL IJ SOAJ injection Inject 0.3 mg into the muscle as needed for anaphylaxis.   fluticasone (FLONASE) 50 MCG/ACT nasal spray Place 1 spray into both nostrils daily as needed for allergies or rhinitis.   Homeopathic Products (RESTFUL LEGS PM SL) Place 1 tablet under the tongue at bedtime.   ibuprofen  (ADVIL ) 200 MG tablet Take 3 tablets (600 mg total) by mouth every 8 (eight) hours as needed.   LORazepam   (ATIVAN ) 0.5 MG tablet Take 1 tablet (0.5 mg total) by mouth at bedtime as needed for sleep.   metoprolol  succinate (TOPROL -XL) 25 MG 24 hr tablet TAKE 1 TABLET IN THE MORNING AND AT BEDTIME   Multiple Vitamin (MULTIVITAMIN) tablet Take 1 tablet by mouth daily with breakfast.   oxyCODONE  (OXY IR/ROXICODONE ) 5 MG immediate release tablet Take 1 tablet (5 mg total) by mouth every 6 (six) hours as needed for severe pain (pain score 7-10) (not releived by tylenol  or advil ).   polyethylene glycol (MIRALAX  / GLYCOLAX ) 17 g packet Take 17 g by mouth 2 (two) times daily as needed for moderate constipation.   rosuvastatin  (CRESTOR ) 40 MG tablet TAKE ONE-HALF (1/2) TABLET (20 MG) ON SUNDAY, TUESDAY, THURSDAY, AND SATURDAY. TAKE 1 TABLET (40 MG) ON MONDAY, WEDNESDAY, AND FRIDAY     Allergies:   Bee venom   Past Medical History:  Diagnosis Date   Allergic rhinitis    Anaphylactic reaction to bee sting    Anxiety state    unspecified   Arthritis    Ortho Dr. Yvone   Disorder of bone    unspecified   Essential hypertension 12/28/2019   Hypercholesteremia    migrated  Hyperlipemia    Long term (current) use of anticoagulants 12/28/2019   PAF   Low back pain    Major depression    in complete remission   Osteopenia    Osteoporosis    PAF (paroxysmal atrial fibrillation) (HCC) 12/28/2019   Post-menopausal    Presence of Watchman left atrial appendage closure device 05/15/2022   Watchman FLX 20mm placed by Dr. Cindie   Senile purpura [facility])    Sensorineural hearing loss (SNHL) of both ears 05/08/2021   Dr. Karis 2022   Shingles    Sinusitis    UTI (urinary tract infection)    Vitamin D  deficiency     Past Surgical History:  Procedure Laterality Date   ATRIAL FIBRILLATION ABLATION N/A 01/23/2022   Procedure: ATRIAL FIBRILLATION ABLATION;  Surgeon: Cindie Ole DASEN, MD;  Location: [facility] INVASIVE CV LAB;  Service: Cardiovascular;  Laterality: N/A;   LAPAROSCOPIC APPENDECTOMY N/A 06/24/2023    Procedure: APPENDECTOMY, LAPAROSCOPIC;  Surgeon: Curvin Deward MOULD, MD;  Location: [facility] ORS;  Service: General;  Laterality: N/A;   LEFT ATRIAL APPENDAGE OCCLUSION N/A 05/15/2022   Procedure: LEFT ATRIAL APPENDAGE OCCLUSION;  Surgeon: Cindie Ole DASEN, MD;  Location: [facility] INVASIVE CV LAB;  Service: Cardiovascular;  Laterality: N/A;   NO PAST SURGERIES     TEE WITHOUT CARDIOVERSION N/A 05/15/2022   Procedure: TRANSESOPHAGEAL ECHOCARDIOGRAM (TEE);  Surgeon: Cindie Ole DASEN, MD;  Location: [facility] INVASIVE CV LAB;  Service: Cardiovascular;  Laterality: N/A;     Social History:  The patient  reports that she quit smoking about 15 years ago. Her smoking use included cigarettes. She has never used smokeless tobacco. She reports that she does not currently use alcohol. She reports that she does not use drugs.   Family History:  The patient's family history is not on file.    ROS:  Please see the history of present illness. All other systems are reviewed and  Negative to the above problem except as noted.    PHYSICAL EXAM: VS:  BP 131/75   Pulse 79   Ht 5' 6 (1.676 m)   Wt 114 lb 12.8 oz (52.1 kg)   SpO2 97%   BMI 18.53 kg/m    GEN: Thin 78 yo  in no acute distress  HEENT: normal  Neck: JVP is normal  No bruit Cardiac: RRR  No murmurs   Respiratory:  clear to auscultation  GI: Soft   Tender in L LQ   No rebound     EKG:  EKG is not ordered today     Cardiac CT (May 2024)  1. There is normal pulmonary vein drainage with no evidence of pulmonary vein stenosis.   2. Watchman Flx, 20 mm, with incomplete endothelialization and mitral shoulder as noted above.   4. Coronary calcium  score of 218, which is 68th percentile for age, sex, and race matched control.    Echo   May 2023  1. Left ventricular ejection fraction, by estimation, is 60 to 65% . The left ventricle has normal function. The left ventricle has no regional wall motion abnormalities. Left ventricular diastolic parameters  were normal. 2. Right ventricular systolic function is normal. The right ventricular size is normal. 3. Left atrial size was moderately dilated. 4. The mitral valve is abnormal. Trivial mitral valve regurgitation. No evidence of mitral stenosis. 5. The aortic valve is tricuspid. There is mild calcification of the aortic valve. There is mild thickening of the aortic valve. Aortic valve regurgitation is trivial. Aortic  valve sclerosis is present, with no evidence of aortic valve stenosis. 6. The inferior vena cava is normal in size with greater than 50% respiratory variability, suggesting right atrial pressure of 3 mmHg. Sept 2022  Carotid   Summary: Right Carotid: Velocities in the right ICA are consistent with a 1-39% stenosis. Left Carotid: Velocities in the left ICA are consistent with a 1-39% stenosis. Vertebrals: Bilateral vertebral arteries demonstrate antegrade flow. Subclavians: Normal flow hemodynamics were seen in bilateral subclavian arteries. Lipid Panel    Component Value Date/Time   CHOL 128 06/22/2023 0905   CHOL 146 06/16/2022 1022   TRIG 53.0 06/22/2023 0905   HDL 56.50 06/22/2023 0905   HDL 61 06/16/2022 1022   CHOLHDL 2 06/22/2023 0905   VLDL 10.6 06/22/2023 0905   LDLCALC 61 06/22/2023 0905   LDLCALC 71 06/16/2022 1022      Wt Readings from Last 3 Encounters:  10/08/23 114 lb 12.8 oz (52.1 kg)  06/24/23 125 lb (56.7 kg)  06/23/23 125 lb (56.7 kg)      ASSESSMENT AND PLAN:  1  PAF  Pt is s/p ablation and also s/p Watchman procedure. She denies palpitations    Off of anticoagulation    2 CAD Found on CT scan NO symptoms of angina    3  HL  April 2025 LDL61, HDL 57    Keep on Crestor      4    HTN  BP is well controlled    5  GI   Pt s/p appy   Still having bowel issues   Tender on exam in LLQ     Will check CBC and WESR   She says she is having some fevers at night     I encouraged her to call her surgeon.       Current medicines are reviewed at length  with the patient today.  The patient does not have concerns regarding medicines.  Signed, Vina Gull, MD  10/08/2023 12:26 PM    [facility] [address] [place], [place], [place]  [address] Phone: [phone]; Fax: [phone]

## 2023-10-08 ENCOUNTER — Ambulatory Visit: Attending: Internal Medicine | Admitting: Internal Medicine

## 2023-10-08 ENCOUNTER — Encounter: Payer: Self-pay | Admitting: Internal Medicine

## 2023-10-08 VITALS — BP 131/75 | HR 79 | Ht 66.0 in | Wt 114.8 lb

## 2023-10-08 DIAGNOSIS — I10 Essential (primary) hypertension: Secondary | ICD-10-CM | POA: Diagnosis not present

## 2023-10-08 NOTE — Patient Instructions (Signed)
 Medication Instructions:  Your physician recommends that you continue on your current medications as directed. Please refer to the Current Medication list given to you today.  *If you need a refill on your cardiac medications before your next appointment, please call your pharmacy*  Lab Work: TODAY: CBC w/differential, Sed rate, BMET If you have labs (blood work) drawn today and your tests are completely normal, you will receive your results only by: MyChart Message (if you have MyChart) OR A paper copy in the mail If you have any lab test that is abnormal or we need to change your treatment, we will call you to review the results.  Follow-Up: At [facility], you and your health needs are our priority.  As part of our continuing mission to provide you with exceptional heart care, our providers are all part of one team.  This team includes your primary Cardiologist (physician) and Advanced Practice Providers or APPs (Physician Assistants and Nurse Practitioners) who all work together to provide you with the care you need, when you need it.  Your next appointment:   9 month(s) Call in January 2026 to schedule appointment.  Provider:   Vina Gull, MD   We recommend signing up for the patient portal called MyChart.  Sign up information is provided on this After Visit Summary.  MyChart is used to connect with patients for Virtual Visits (Telemedicine).  Patients are able to view lab/test results, encounter notes, upcoming appointments, etc.  Non-urgent messages can be sent to your provider as well.    To learn more about what you can do with MyChart, go to [email or web address].

## 2023-10-09 ENCOUNTER — Ambulatory Visit: Payer: Self-pay | Admitting: Internal Medicine

## 2023-10-09 LAB — BASIC METABOLIC PANEL WITH GFR
BUN/Creatinine Ratio: 21 (ref 12–28)
BUN: 15 mg/dL (ref 8–27)
CO2: 22 mmol/L (ref 20–29)
Calcium: 9 mg/dL (ref 8.7–10.3)
Chloride: 101 mmol/L (ref 96–106)
Creatinine, Ser: 0.73 mg/dL (ref 0.57–1.00)
Glucose: 94 mg/dL (ref 70–99)
Potassium: 4.3 mmol/L (ref 3.5–5.2)
Sodium: 137 mmol/L (ref 134–144)
eGFR: 83 mL/min/1.73 (ref >59)

## 2023-10-09 LAB — CBC WITH DIFFERENTIAL/PLATELET
Basophils Absolute: 0.1 x10E3/uL (ref 0.0–0.2)
Basos: 1 %
EOS (ABSOLUTE): 0.2 x10E3/uL (ref 0.0–0.4)
Eos: 2 %
Hematocrit: 36.6 % (ref 34.0–46.6)
Hemoglobin: 11.5 g/dL (ref 11.1–15.9)
Immature Grans (Abs): 0 x10E3/uL (ref 0.0–0.1)
Immature Granulocytes: 0 %
Lymphocytes Absolute: 2.7 x10E3/uL (ref 0.7–3.1)
Lymphs: 24 %
MCH: 31 pg (ref 26.6–33.0)
MCHC: 31.4 g/dL — ABNORMAL LOW (ref 31.5–35.7)
MCV: 99 fL — ABNORMAL HIGH (ref 79–97)
Monocytes Absolute: 1.2 x10E3/uL — ABNORMAL HIGH (ref 0.1–0.9)
Monocytes: 11 %
Neutrophils Absolute: 7.1 x10E3/uL — ABNORMAL HIGH (ref 1.4–7.0)
Neutrophils: 62 %
Platelets: 358 x10E3/uL (ref 150–450)
RBC: 3.71 x10E6/uL — ABNORMAL LOW (ref 3.77–5.28)
RDW: 12.8 % (ref 11.7–15.4)
WBC: 11.3 x10E3/uL — ABNORMAL HIGH (ref 3.4–10.8)

## 2023-10-09 LAB — SEDIMENTATION RATE: Sed Rate: 32 mm/h (ref 0–40)

## 2023-10-14 DIAGNOSIS — R1032 Left lower quadrant pain: Secondary | ICD-10-CM | POA: Diagnosis not present

## 2023-11-23 ENCOUNTER — Other Ambulatory Visit: Payer: Self-pay | Admitting: Internal Medicine

## 2023-12-14 DIAGNOSIS — D485 Neoplasm of uncertain behavior of skin: Secondary | ICD-10-CM | POA: Diagnosis not present

## 2023-12-14 DIAGNOSIS — L570 Actinic keratosis: Secondary | ICD-10-CM | POA: Diagnosis not present

## 2023-12-14 DIAGNOSIS — L819 Disorder of pigmentation, unspecified: Secondary | ICD-10-CM | POA: Diagnosis not present

## 2023-12-14 DIAGNOSIS — Z85828 Personal history of other malignant neoplasm of skin: Secondary | ICD-10-CM | POA: Diagnosis not present

## 2023-12-14 DIAGNOSIS — C4441 Basal cell carcinoma of skin of scalp and neck: Secondary | ICD-10-CM | POA: Diagnosis not present

## 2023-12-14 DIAGNOSIS — L821 Other seborrheic keratosis: Secondary | ICD-10-CM | POA: Diagnosis not present

## 2023-12-17 DIAGNOSIS — Z1231 Encounter for screening mammogram for malignant neoplasm of breast: Secondary | ICD-10-CM | POA: Diagnosis not present

## 2023-12-17 LAB — HM MAMMOGRAPHY

## 2023-12-30 DIAGNOSIS — Z85828 Personal history of other malignant neoplasm of skin: Secondary | ICD-10-CM | POA: Diagnosis not present

## 2023-12-30 DIAGNOSIS — C4441 Basal cell carcinoma of skin of scalp and neck: Secondary | ICD-10-CM | POA: Diagnosis not present

## 2024-03-13 NOTE — Progress Notes (Unsigned)
 "   Cardiology Office Note   Date:  03/15/2024   ID:  Carly Myers, Carly Myers 1945/09/21, MRN [id number]  PCP:  Jodie Lavern CROME, MD  Cardiologist:   Vina Gull, MD   F/U of atrial fibrillation     History of Present Illness: Carly Myers is a 79 y.o. female with hx of PAF/paroxysmal atrial flutter  and CAD (on CT scan) and vertigo   2019 Atrial fib  Pt placed on Toprol  XL and Xarelto   Echo LVEF normal LA normal      Nov 2023  CT (prior to ablation) Ca score 174   Nov 2023  Pt underwent afib ablation      2024  CT (prior to Watchman )  Ca score 218   March 2024  Pt had Watchman placed   I last saw the pt in Nov 2024   She remains active  Plays golf  Walks dogs   Has some DJD of knees that limit Notes occasional L sided pain (upper abdomen, L upper chest )  Not frequent   Not associated with activity   NO SOB    No other chest pains   No CP with activity    Denies SOB   No palpitations No dizziness Current Meds  Medication Sig   acetaminophen  (TYLENOL ) 500 MG tablet Take 2 tablets (1,000 mg total) by mouth 4 (four) times daily. (Patient taking differently: Take 1,000 mg by mouth as needed.)   Artificial Tears ophthalmic solution Place 1 drop into both eyes 3 (three) times daily as needed (for dryness).   aspirin EC 81 MG tablet Take 81 mg by mouth at bedtime. Swallow whole.   BENADRYL ALLERGY 25 MG tablet Take 12.5 mg by mouth at bedtime as needed for allergies or sleep.   COLLAGEN-VITAMIN C-BIOTIN PO Take 1 capsule by mouth daily.   diazepam  (VALIUM ) 2 MG tablet Take 1 tablet (2 mg total) by mouth 2 (two) times daily as needed (vertigo). (Patient taking differently: Take 1 mg by mouth 2 (two) times daily as needed (vertigo).)   docusate sodium  (COLACE) 100 MG capsule Take 1 capsule (100 mg total) by mouth 2 (two) times daily.   EPINEPHrine  0.3 mg/0.3 mL IJ SOAJ injection Inject 0.3 mg into the muscle as needed for anaphylaxis.   fluticasone (FLONASE) 50 MCG/ACT nasal spray Place 1 spray  into both nostrils daily as needed for allergies or rhinitis.   Homeopathic Products (RESTFUL LEGS PM SL) Place 1 tablet under the tongue at bedtime.   ibuprofen  (ADVIL ) 200 MG tablet Take 3 tablets (600 mg total) by mouth every 8 (eight) hours as needed.   LORazepam  (ATIVAN ) 0.5 MG tablet Take 1 tablet (0.5 mg total) by mouth at bedtime as needed for sleep.   metoprolol  succinate (TOPROL -XL) 25 MG 24 hr tablet TAKE 1 TABLET IN THE MORNING AND AT BEDTIME   Multiple Vitamin (MULTIVITAMIN) tablet Take 1 tablet by mouth daily with breakfast.   polyethylene glycol (MIRALAX  / GLYCOLAX ) 17 g packet Take 17 g by mouth 2 (two) times daily as needed for moderate constipation.   rosuvastatin  (CRESTOR ) 40 MG tablet TAKE ONE-HALF (1/2) TABLET (20 MG) ON SUNDAY, TUESDAY, THURSDAY, AND SATURDAY. TAKE 1 TABLET (40 MG) ON MONDAY, WEDNESDAY, AND FRIDAY     Allergies:   Bee venom   Past Medical History:  Diagnosis Date   Allergic rhinitis    Anaphylactic reaction to bee sting    Anxiety state    unspecified   Arthritis  Ortho Dr. Yvone   Disorder of bone    unspecified   Essential hypertension 12/28/2019   Hypercholesteremia    migrated   Hyperlipemia    Long term (current) use of anticoagulants 12/28/2019   PAF   Low back pain    Major depression    in complete remission   Osteopenia    Osteoporosis    PAF (paroxysmal atrial fibrillation) (HCC) 12/28/2019   Post-menopausal    Presence of Watchman left atrial appendage closure device 05/15/2022   Watchman FLX 20mm placed by Dr. Cindie   Senile purpura    Sensorineural hearing loss (SNHL) of both ears 05/08/2021   Dr. Karis 2022   Shingles    Sinusitis    UTI (urinary tract infection)    Vitamin D  deficiency     Past Surgical History:  Procedure Laterality Date   ATRIAL FIBRILLATION ABLATION N/A 01/23/2022   Procedure: ATRIAL FIBRILLATION ABLATION;  Surgeon: Cindie Ole DASEN, MD;  Location: [facility];  Service:  Cardiovascular;  Laterality: N/A;   LAPAROSCOPIC APPENDECTOMY N/A 06/24/2023   Procedure: APPENDECTOMY, LAPAROSCOPIC;  Surgeon: Curvin Deward MOULD, MD;  Location: [facility];  Service: General;  Laterality: N/A;   LEFT ATRIAL APPENDAGE OCCLUSION N/A 05/15/2022   Procedure: LEFT ATRIAL APPENDAGE OCCLUSION;  Surgeon: Cindie Ole DASEN, MD;  Location: [facility];  Service: Cardiovascular;  Laterality: N/A;   NO PAST SURGERIES     TEE WITHOUT CARDIOVERSION N/A 05/15/2022   Procedure: TRANSESOPHAGEAL ECHOCARDIOGRAM (TEE);  Surgeon: Cindie Ole DASEN, MD;  Location: [facility] INVASIVE CV LAB;  Service: Cardiovascular;  Laterality: N/A;     Social History:  The patient  reports that she quit smoking about 16 years ago. Her smoking use included cigarettes. She has never used smokeless tobacco. She reports that she does not currently use alcohol. She reports that she does not use drugs.   Family History:  The patient's family history is not on file.    ROS:  Please see the history of present illness. All other systems are reviewed and  Negative to the above problem except as noted.    PHYSICAL EXAM: VS:  BP 130/80 (BP Location: Left Arm, Patient Position: Sitting, Cuff Size: Normal)   Pulse 63   Ht 5' 6 (1.676 m)   Wt 118 lb 4.8 oz (53.7 kg)   SpO2 97%   BMI 19.09 kg/m    GEN: Thin 79 yo  in no acute distress  HEENT: normal  Neck: JVP is normal  No bruits Cardiac: RRR  No murmurs   Respiratory:  clear to auscultation  GI: soft, nontender  No hepatomegaly  Ext  No LE edema   EKG:  EKG shows SR 63 bpm  Septal infarct     Cardiac CT (May 2024)  1. There is normal pulmonary vein drainage with no evidence of pulmonary vein stenosis.   2. Watchman Flx, 20 mm, with incomplete endothelialization and mitral shoulder as noted above.   4. Coronary calcium  score of 218, which is 68th percentile for age, sex, and race matched control.    Echo   May 2023  1. Left ventricular ejection fraction,  by estimation, is 60 to 65% . The left ventricle has normal function. The left ventricle has no regional wall motion abnormalities. Left ventricular diastolic parameters were normal. 2. Right ventricular systolic function is normal. The right ventricular size is normal. 3. Left atrial size was moderately dilated. 4. The mitral valve is abnormal. Trivial  mitral valve regurgitation. No evidence of mitral stenosis. 5. The aortic valve is tricuspid. There is mild calcification of the aortic valve. There is mild thickening of the aortic valve. Aortic valve regurgitation is trivial. Aortic valve sclerosis is present, with no evidence of aortic valve stenosis. 6. The inferior vena cava is normal in size with greater than 50% respiratory variability, suggesting right atrial pressure of 3 mmHg. Sept 2022  Carotid   Summary: Right Carotid: Velocities in the right ICA are consistent with a 1-39% stenosis. Left Carotid: Velocities in the left ICA are consistent with a 1-39% stenosis. Vertebrals: Bilateral vertebral arteries demonstrate antegrade flow. Subclavians: Normal flow hemodynamics were seen in bilateral subclavian arteries. Lipid Panel    Component Value Date/Time   CHOL 128 06/22/2023 0905   CHOL 146 06/16/2022 1022   TRIG 53.0 06/22/2023 0905   HDL 56.50 06/22/2023 0905   HDL 61 06/16/2022 1022   CHOLHDL 2 06/22/2023 0905   VLDL 10.6 06/22/2023 0905   LDLCALC 61 06/22/2023 0905   LDLCALC 71 06/16/2022 1022      Wt Readings from Last 3 Encounters:  03/15/24 118 lb 4.8 oz (53.7 kg)  10/08/23 114 lb 12.8 oz (52.1 kg)  06/24/23 125 lb (56.7 kg)      ASSESSMENT AND PLAN:  1  PAF  Pt is s/p ablation and also s/o Watchman procedure.  Off of anticoag   Denies palpitations   In SR today   2 CAD Found on CT scan   Pt without angina   Rx risk factors     3  HL  LDL 61  HDL 57  Trig 53 in APril 2025  Keep on statin  Will check NMR panel and lpa   4  HTN  BP remains well controlled   5  HCM    Last A1C 6  REviewed diet  Will check A1C , BMET and TSH      Tentative follow up in October         Current medicines are reviewed at length with the patient today.  The patient does not have concerns regarding medicines.  Signed, Vina Gull, MD  03/15/2024 10:50 AM    [facility] [address] [place], [place], [place]  [address] Phone: [phone]; Fax: [phone]   "

## 2024-03-15 ENCOUNTER — Ambulatory Visit: Attending: Internal Medicine | Admitting: Internal Medicine

## 2024-03-15 ENCOUNTER — Encounter: Payer: Self-pay | Admitting: Internal Medicine

## 2024-03-15 VITALS — BP 130/80 | HR 63 | Ht 66.0 in | Wt 118.3 lb

## 2024-03-15 DIAGNOSIS — I4891 Unspecified atrial fibrillation: Secondary | ICD-10-CM

## 2024-03-15 DIAGNOSIS — I10 Essential (primary) hypertension: Secondary | ICD-10-CM

## 2024-03-15 DIAGNOSIS — I480 Paroxysmal atrial fibrillation: Secondary | ICD-10-CM | POA: Diagnosis not present

## 2024-03-15 NOTE — Patient Instructions (Signed)
 Medication Instructions:  Your physician recommends that you continue on your current medications as directed. Please refer to the Current Medication list given to you today.  *If you need a refill on your cardiac medications before your next appointment, please call your pharmacy*  Lab Work: BMP, CBC, A1C, Lpa, NMR at [facility]  If you have labs (blood work) drawn today and your tests are completely normal, you will receive your results only by: MyChart Message (if you have MyChart) OR A paper copy in the mail If you have any lab test that is abnormal or we need to change your treatment, we will call you to review the results.  Testing/Procedures: NONE   Follow-Up: At [facility], you and your health needs are our priority.  As part of our continuing mission to provide you with exceptional heart care, our providers are all part of one team.  This team includes your primary Cardiologist (physician) and Advanced Practice Providers or APPs (Physician Assistants and Nurse Practitioners) who all work together to provide you with the care you need, when you need it.  Your next appointment:   9-10 month(s)  Provider:   Vina Gull, MD     Other Instructions

## 2024-03-16 ENCOUNTER — Other Ambulatory Visit (HOSPITAL_COMMUNITY): Payer: Self-pay

## 2024-03-16 ENCOUNTER — Ambulatory Visit: Payer: Self-pay | Admitting: Internal Medicine

## 2024-03-16 LAB — BASIC METABOLIC PANEL WITH GFR
BUN/Creatinine Ratio: 19 (ref 12–28)
BUN: 15 mg/dL (ref 8–27)
CO2: 23 mmol/L (ref 20–29)
Calcium: 9.4 mg/dL (ref 8.7–10.3)
Chloride: 103 mmol/L (ref 96–106)
Creatinine, Ser: 0.81 mg/dL (ref 0.57–1.00)
Glucose: 102 mg/dL — AB (ref 70–99)
Potassium: 4.9 mmol/L (ref 3.5–5.2)
Sodium: 141 mmol/L (ref 134–144)
eGFR: 73 mL/min/1.73

## 2024-03-16 LAB — NMR, LIPOPROFILE
Cholesterol, Total: 153 mg/dL (ref 100–199)
HDL Particle Number: 35.6 umol/L
HDL-C: 67 mg/dL
LDL Particle Number: 762 nmol/L
LDL Size: 21 nm
LDL-C (NIH Calc): 74 mg/dL (ref 0–99)
LP-IR Score: <25
Small LDL Particle Number: <90 nmol/L
Triglycerides: 58 mg/dL (ref 0–149)

## 2024-03-16 LAB — CBC
Hematocrit: 41.4 % (ref 34.0–46.6)
Hemoglobin: 12.9 g/dL (ref 11.1–15.9)
MCH: 31.9 pg (ref 26.6–33.0)
MCHC: 31.2 g/dL — ABNORMAL LOW (ref 31.5–35.7)
MCV: 102 fL — ABNORMAL HIGH (ref 79–97)
Platelets: 315 x10E3/uL (ref 150–450)
RBC: 4.05 x10E6/uL (ref 3.77–5.28)
RDW: 12.2 % (ref 11.7–15.4)
WBC: 8.9 x10E3/uL (ref 3.4–10.8)

## 2024-03-16 LAB — LIPOPROTEIN A (LPA): Lipoprotein (a): 54.5 nmol/L

## 2024-03-16 LAB — HEMOGLOBIN A1C
Est. average glucose Bld gHb Est-mCnc: 123 mg/dL
Hgb A1c MFr Bld: 5.9 % — ABNORMAL HIGH (ref 4.8–5.6)

## 2024-03-17 ENCOUNTER — Other Ambulatory Visit (HOSPITAL_COMMUNITY): Payer: Self-pay

## 2024-03-17 ENCOUNTER — Other Ambulatory Visit: Payer: Self-pay

## 2024-03-17 MED FILL — Metoprolol Succinate Tab ER 24HR 25 MG (Tartrate Equiv): ORAL | 90 days supply | Qty: 180 | Fill #0 | Status: AC

## 2024-06-22 ENCOUNTER — Ambulatory Visit: Admitting: Family Medicine
